# Patient Record
Sex: Female | Born: 1985 | Race: Black or African American | Hispanic: No | Marital: Single | Smoking: Current some day smoker
Health system: Southern US, Community
[De-identification: ages and names within clinical notes are randomized; demographics above are authoritative.]

## PROBLEM LIST (undated history)

## (undated) ENCOUNTER — Inpatient Hospital Stay (HOSPITAL_COMMUNITY): Payer: Self-pay

## (undated) ENCOUNTER — Emergency Department (HOSPITAL_COMMUNITY): Payer: Medicaid Other | Source: Home / Self Care

## (undated) DIAGNOSIS — D649 Anemia, unspecified: Secondary | ICD-10-CM

## (undated) DIAGNOSIS — E162 Hypoglycemia, unspecified: Secondary | ICD-10-CM

## (undated) DIAGNOSIS — F32A Depression, unspecified: Secondary | ICD-10-CM

## (undated) DIAGNOSIS — K219 Gastro-esophageal reflux disease without esophagitis: Secondary | ICD-10-CM

## (undated) DIAGNOSIS — F329 Major depressive disorder, single episode, unspecified: Secondary | ICD-10-CM

## (undated) DIAGNOSIS — B999 Unspecified infectious disease: Secondary | ICD-10-CM

## (undated) DIAGNOSIS — F419 Anxiety disorder, unspecified: Secondary | ICD-10-CM

## (undated) HISTORY — PX: EYE SURGERY: SHX253

---

## 2011-07-07 ENCOUNTER — Emergency Department (HOSPITAL_COMMUNITY)
Admission: EM | Admit: 2011-07-07 | Discharge: 2011-07-07 | Disposition: A | Discharge status: Home / Self Care | Payer: Self-pay | Attending: Emergency Medicine | Admitting: Emergency Medicine

## 2011-07-07 DIAGNOSIS — N644 Mastodynia: Secondary | ICD-10-CM | POA: Insufficient documentation

## 2011-07-07 DIAGNOSIS — M546 Pain in thoracic spine: Secondary | ICD-10-CM | POA: Insufficient documentation

## 2011-07-07 DIAGNOSIS — F411 Generalized anxiety disorder: Secondary | ICD-10-CM | POA: Insufficient documentation

## 2011-07-07 DIAGNOSIS — M25519 Pain in unspecified shoulder: Secondary | ICD-10-CM | POA: Insufficient documentation

## 2011-07-07 DIAGNOSIS — M542 Cervicalgia: Secondary | ICD-10-CM | POA: Insufficient documentation

## 2011-11-23 ENCOUNTER — Encounter (HOSPITAL_COMMUNITY): Payer: Self-pay

## 2011-11-23 ENCOUNTER — Emergency Department (HOSPITAL_COMMUNITY)
Admission: EM | Admit: 2011-11-23 | Discharge: 2011-11-23 | Disposition: A | Discharge status: Home / Self Care | Payer: Medicaid Other | Attending: Emergency Medicine | Admitting: Emergency Medicine

## 2011-11-23 ENCOUNTER — Emergency Department (HOSPITAL_COMMUNITY): Payer: Medicaid Other

## 2011-11-23 DIAGNOSIS — J40 Bronchitis, not specified as acute or chronic: Secondary | ICD-10-CM | POA: Insufficient documentation

## 2011-11-23 DIAGNOSIS — R109 Unspecified abdominal pain: Secondary | ICD-10-CM | POA: Insufficient documentation

## 2011-11-23 DIAGNOSIS — M549 Dorsalgia, unspecified: Secondary | ICD-10-CM | POA: Insufficient documentation

## 2011-11-23 DIAGNOSIS — K219 Gastro-esophageal reflux disease without esophagitis: Secondary | ICD-10-CM | POA: Insufficient documentation

## 2011-11-23 DIAGNOSIS — R059 Cough, unspecified: Secondary | ICD-10-CM | POA: Insufficient documentation

## 2011-11-23 DIAGNOSIS — R05 Cough: Secondary | ICD-10-CM | POA: Insufficient documentation

## 2011-11-23 DIAGNOSIS — J3489 Other specified disorders of nose and nasal sinuses: Secondary | ICD-10-CM | POA: Insufficient documentation

## 2011-11-23 LAB — COMPREHENSIVE METABOLIC PANEL
ALT: 57 U/L — ABNORMAL HIGH (ref 0–35)
AST: 34 U/L (ref 0–37)
Albumin: 4 g/dL (ref 3.5–5.2)
Alkaline Phosphatase: 94 U/L (ref 39–117)
CO2: 27 mEq/L (ref 19–32)
Chloride: 101 mEq/L (ref 96–112)
Creatinine, Ser: 0.61 mg/dL (ref 0.50–1.10)
Potassium: 3.6 mEq/L (ref 3.5–5.1)
Sodium: 136 mEq/L (ref 135–145)
Total Bilirubin: 0.3 mg/dL (ref 0.3–1.2)

## 2011-11-23 LAB — LIPASE, BLOOD: Lipase: 24 U/L (ref 11–59)

## 2011-11-23 MED ORDER — OMEPRAZOLE 20 MG PO CPDR: 20.0000 mg | DELAYED_RELEASE_CAPSULE | Freq: Every day | ORAL | Status: DC

## 2011-11-23 MED ORDER — GUAIFENESIN ER 600 MG PO TB12: 600.0000 mg | ORAL_TABLET | Freq: Two times a day (BID) | ORAL | Status: DC

## 2011-11-23 MED ORDER — AZITHROMYCIN 250 MG PO TABS: 250.0000 mg | ORAL_TABLET | Freq: Every day | ORAL | Status: AC

## 2011-11-23 NOTE — Discharge Instructions (Signed)
Your labs today were normal. It is recommended that you follow up with a local primary care Dr. for further evaluation and treatment for your symptoms. You have been given a trial prescription of omeprazole for reflux. This sometimes takes several weeks to become effective; please continue to take it.   Your chest x-ray shows minimal bronchitis. Take the Mucinex as prescribed for congestion. Continue to use your Nasonex as prescribed. You may use the Afrin that you have at home as well, but do not use it for more than 5 days. You may additionally try saltwater nose spray. If you're having worsening chest pain, shortness of breath, high fever, or any other worrisome symptoms, please return to the ER for further evaluation and treatment.  RESOURCE GUIDE  Dental Problems  Patients with Medicaid: [facility] [phone] [address]                                           [phone] [address] Phone:  [phone]                                                  Phone:  [phone]  If unable to pay or uninsured, contact:  [facility] or [facility]. to become qualified for the adult dental clinic.  Chronic Pain Problems Contact [facility]  [phone] Patients need to be referred by their primary care doctor.  Insufficient Money for Medicine Contact [employer]:  call "211" or [facility] (619) 221-0324.  No Primary Care Doctor Call [facility]  [phone] Other agencies that provide inexpensive medical care    [facility]  [phone]    [facility]  [phone]    [facility]  [phone]    [facility]  [phone]    [facility]  365-800-7166    [facility]  [phone]  Psychological Services [facility]  [phone] [facility]  [phone] [facility]   [phone] (emergency services [phone])  Substance Abuse  Resources [facility]  (804)056-1792 [facility] 561-659-6296 [facility] 210-373-6196 Floydene Flock (743)570-9113 Residential & Outpatient Substance Abuse Program  717-194-1617  Abuse/Neglect [facility] Child Abuse Hotline 4167653729 [facility] Child Abuse Hotline (574)698-5799 (After Hours)  Emergency Shelter [facility] (412) 224-7672  [facility] [place] [facility] ([phone] [phone] [facility] 2208034987  MRSA Hotline #:   (502) 516-2601    [facility] Resources  [facility]     [employer]                          [facility] Dept. [address] [place]                       [address]      [address]  [address]  Cristobal Goldmann Phone:  [phone]                                   Phone:  [phone]                 Phone:  [phone]  [facility] Phone:  [phone]  [facility] Child Abuse Hotline (818)504-0510 562-004-8540 (After Hours)  Bronchitis Bronchitis is the body's way of reacting to injury and/or infection (inflammation) of the bronchi. Bronchi are the air tubes that extend from the windpipe into the lungs. If the inflammation becomes severe, it may cause shortness of breath. CAUSES  Inflammation may be caused by:  A virus.   Germs (bacteria).   Dust.   Allergens.   Pollutants and many other irritants.  The cells lining the bronchial tree are covered with tiny hairs (cilia). These constantly beat upward, away from the lungs, toward the mouth. This keeps the lungs free of pollutants. When these cells become too irritated and are unable to do their job, mucus begins to develop. This causes the characteristic cough of bronchitis. The cough clears the lungs when the cilia are unable to do their job. Without either  of these protective mechanisms, the mucus would settle in the lungs. Then you would develop pneumonia. Smoking is a common cause of bronchitis and can contribute to pneumonia. Stopping this habit is the single most important thing you can do to help yourself. TREATMENT   Your caregiver may prescribe an antibiotic if the cough is caused by bacteria. Also, medicines that open up your airways make it easier to breathe. Your caregiver may also recommend or prescribe an expectorant. It will loosen the mucus to be coughed up. Only take over-the-counter or prescription medicines for pain, discomfort, or fever as directed by your caregiver.   Removing whatever causes the problem (smoking, for example) is critical to preventing the problem from getting worse.   Cough suppressants may be prescribed for relief of cough symptoms.   Inhaled medicines may be prescribed to help with symptoms now and to help prevent problems from returning.   For those with recurrent (chronic) bronchitis, there may be a need for steroid medicines.  SEEK IMMEDIATE MEDICAL CARE IF:   During treatment, you develop more pus-like mucus (purulent sputum).   You have a fever.   Your baby is older than 3 months with a rectal temperature of 102 F (38.9 C) or higher.   Your baby is 56 months old or younger with a rectal temperature of 100.4 F (38 C) or higher.   You become progressively more ill.   You have increased difficulty breathing, wheezing, or shortness of breath.  It is necessary to seek immediate medical care if you are elderly or sick from any other disease. MAKE SURE YOU:   Understand these instructions.   Will watch your condition.   Will get help right away if you are not doing well or get worse.  Document Released: 09/22/2005 Document Revised: 06/04/2011 Document Reviewed: 08/01/2008 [facility] Patient Information 2012 [place], [place].  Gastroesophageal Reflux Disease, Adult Gastroesophageal reflux  disease (GERD) happens when acid from your stomach flows up into the esophagus. When acid comes in contact with the esophagus, the acid causes soreness (  inflammation) in the esophagus. Over time, GERD may create small holes (ulcers) in the lining of the esophagus. CAUSES   Increased body weight. This puts pressure on the stomach, making acid rise from the stomach into the esophagus.   Smoking. This increases acid production in the stomach.   Drinking alcohol. This causes decreased pressure in the lower esophageal sphincter (valve or ring of muscle between the esophagus and stomach), allowing acid from the stomach into the esophagus.   Late evening meals and a full stomach. This increases pressure and acid production in the stomach.   A malformed lower esophageal sphincter.  Sometimes, no cause is found. SYMPTOMS   Burning pain in the lower part of the mid-chest behind the breastbone and in the mid-stomach area. This may occur twice a week or more often.   Trouble swallowing.   Sore throat.   Dry cough.   Asthma-like symptoms including chest tightness, shortness of breath, or wheezing.  DIAGNOSIS  Your caregiver may be able to diagnose GERD based on your symptoms. In some cases, X-rays and other tests may be done to check for complications or to check the condition of your stomach and esophagus. TREATMENT  Your caregiver may recommend over-the-counter or prescription medicines to help decrease acid production. Ask your caregiver before starting or adding any new medicines.  HOME CARE INSTRUCTIONS   Change the factors that you can control. Ask your caregiver for guidance concerning weight loss, quitting smoking, and alcohol consumption.   Avoid foods and drinks that make your symptoms worse, such as:   Caffeine or alcoholic drinks.   Chocolate.   Peppermint or mint flavorings.   Garlic and onions.   Spicy foods.   Citrus fruits, such as oranges, lemons, or limes.    Tomato-based foods such as sauce, chili, salsa, and pizza.   Fried and fatty foods.   Avoid lying down for the 3 hours prior to your bedtime or prior to taking a nap.   Eat small, frequent meals instead of large meals.   Wear loose-fitting clothing. Do not wear anything tight around your waist that causes pressure on your stomach.   Raise the head of your bed 6 to 8 inches with wood blocks to help you sleep. Extra pillows will not help.   Only take over-the-counter or prescription medicines for pain, discomfort, or fever as directed by your caregiver.   Do not take aspirin, ibuprofen, or other nonsteroidal anti-inflammatory drugs (NSAIDs).  SEEK IMMEDIATE MEDICAL CARE IF:   You have pain in your arms, neck, jaw, teeth, or back.   Your pain increases or changes in intensity or duration.   You develop nausea, vomiting, or sweating (diaphoresis).   You develop shortness of breath, or you faint.   Your vomit is green, yellow, black, or looks like coffee grounds or blood.   Your stool is red, bloody, or black.  These symptoms could be signs of other problems, such as heart disease, gastric bleeding, or esophageal bleeding. MAKE SURE YOU:   Understand these instructions.   Will watch your condition.   Will get help right away if you are not doing well or get worse.  Document Released: 07/02/2005 Document Revised: 06/04/2011 Document Reviewed: 04/11/2011 [facility] Patient Information 2012 [place], [place].

## 2011-11-23 NOTE — ED Notes (Signed)
Patient here with congestion and productive cough with green sputum x 1 week. Reports no relief with otc meds

## 2011-11-23 NOTE — ED Provider Notes (Signed)
History     CSN: [id number]  Arrival date & time 11/23/11  1741   First MD Initiated Contact with Patient 11/23/11 1941      No chief complaint on file.   (Consider location/radiation/quality/duration/timing/severity/associated sxs/prior treatment) Patient is a 26 y.o. female presenting with URI. The history is provided by the patient.  URI The primary symptoms include cough. Primary symptoms do not include fever, fatigue, headaches, ear pain, sore throat, swollen glands, wheezing, abdominal pain, nausea, vomiting, myalgias, arthralgias or rash. The current episode started 6 to 7 days ago. This is a new problem. The problem has not changed since onset. The cough is productive. The sputum is green.  Symptoms associated with the illness include sinus pressure and congestion. The illness is not associated with chills.   Pt also states she's had several months of upper abd pain which is sometimes worse with eating. It is described as achy in nature and sometimes radiates to upper back. Recently moved to the area - states she was evaled in the past with HIDA which was nl but "showed some slight reflux." Has not tried any antacids for this. Denies associated nausea/vomiting.  History reviewed. No pertinent past medical history.  History reviewed. No pertinent past surgical history.  No family history on file.  History  Substance Use Topics  . Smoking status: Never Smoker   . Smokeless tobacco: Not on file  . Alcohol Use:     OB History    Grav Para Term Preterm Abortions TAB SAB Ect Mult Living                  Review of Systems  Constitutional: Negative for fever, chills and fatigue.  HENT: Positive for congestion and sinus pressure. Negative for ear pain and sore throat.   Respiratory: Positive for cough. Negative for wheezing.   Gastrointestinal: Negative for nausea, vomiting and abdominal pain.  Musculoskeletal: Negative for myalgias and arthralgias.  Skin: Negative for  rash.  Neurological: Negative for headaches.    Allergies  Review of patient's allergies indicates no known allergies.  Home Medications   Current Outpatient Rx  Name Route Sig Dispense Refill  . IBUPROFEN 200 MG PO TABS Oral Take 200 mg by mouth every 6 (six) hours as needed. For pain relief    . MOMETASONE FUROATE 50 MCG/ACT NA SUSP Nasal Place 2 sprays into the nose daily.      BP 131/60  Pulse 91  Temp 98.6 F (37 C)  Resp 18  SpO2 100%  LMP 11/09/2011  Physical Exam  Nursing note and vitals reviewed. Constitutional: She is oriented to person, place, and time. She appears well-developed and well-nourished. No distress.  HENT:  Head: Normocephalic and atraumatic.  Right Ear: External ear normal.  Left Ear: External ear normal.  Mouth/Throat: Oropharynx is clear and moist. No oropharyngeal exudate.       TMs nl b/l Tender to palp of L frontal, maxillary sinuses  Eyes: Conjunctivae and EOM are normal. Pupils are equal, round, and reactive to light. Right eye exhibits no discharge. Left eye exhibits no discharge.  Neck: Normal range of motion. Neck supple.  Cardiovascular: Normal rate, regular rhythm and normal heart sounds.  Exam reveals no gallop and no friction rub.   No murmur heard. Pulmonary/Chest: Effort normal and breath sounds normal. She has no wheezes. She exhibits no tenderness.  Abdominal: Soft. Bowel sounds are normal. There is no tenderness. There is no rebound and no guarding.  Musculoskeletal: Normal  range of motion.  Lymphadenopathy:    She has no cervical adenopathy.  Neurological: She is alert and oriented to person, place, and time.  Skin: Skin is warm and dry. She is not diaphoretic.  Psychiatric: She has a normal mood and affect.    ED Course  Procedures (including critical care time)  Labs Reviewed  COMPREHENSIVE METABOLIC PANEL - Abnormal; Notable for the following:    Calcium 10.7 (*)    ALT 57 (*)    All other components within normal  limits  LIPASE, BLOOD   Dg Chest 2 View  11/23/2011  *RADIOLOGY REPORT*  Clinical Data: Productive cough.  Left lateral chest pain.  Ex- smoker.  CHEST - 2 VIEW  Comparison: None.  Findings: Normal sized heart.  Clear lungs.  Minimal diffuse peribronchial thickening.  Minimal scoliosis.  IMPRESSION: Minimal bronchitic changes.  Original Report Authenticated By: Darrol Angel, M.D.     1. Bronchitis   2. GERD (gastroesophageal reflux disease)       MDM  Pt with bronchitis changes on CXR. Will tx. Also will give trial of prilosec for probable reflux. Emphasized the importance of establishing care with local PCP. Return precautions discussed.        Grant Fontana, [place] 11/24/11 1309

## 2011-11-24 NOTE — ED Provider Notes (Signed)
Medical screening examination/treatment/procedure(s) were performed by non-physician practitioner and as supervising physician I was immediately available for consultation/collaboration.   Garion Wempe, MD 11/24/11 2252 

## 2012-03-11 ENCOUNTER — Ambulatory Visit (INDEPENDENT_AMBULATORY_CARE_PROVIDER_SITE_OTHER): Payer: Medicaid Other | Admitting: Advanced Practice Midwife

## 2012-03-11 ENCOUNTER — Other Ambulatory Visit (HOSPITAL_COMMUNITY)
Admission: RE | Admit: 2012-03-11 | Discharge: 2012-03-11 | Disposition: A | Discharge status: Home / Self Care | Payer: Medicaid Other | Source: Ambulatory Visit | Attending: Advanced Practice Midwife | Admitting: Advanced Practice Midwife

## 2012-03-11 ENCOUNTER — Encounter: Payer: Self-pay | Admitting: Advanced Practice Midwife

## 2012-03-11 ENCOUNTER — Other Ambulatory Visit: Payer: Self-pay | Admitting: Advanced Practice Midwife

## 2012-03-11 VITALS — BP 121/70 | HR 69 | Temp 97.1°F | Ht 61.0 in | Wt 143.6 lb

## 2012-03-11 DIAGNOSIS — Z124 Encounter for screening for malignant neoplasm of cervix: Secondary | ICD-10-CM

## 2012-03-11 DIAGNOSIS — N6452 Nipple discharge: Secondary | ICD-10-CM

## 2012-03-11 DIAGNOSIS — N949 Unspecified condition associated with female genital organs and menstrual cycle: Secondary | ICD-10-CM

## 2012-03-11 DIAGNOSIS — Z01419 Encounter for gynecological examination (general) (routine) without abnormal findings: Secondary | ICD-10-CM | POA: Insufficient documentation

## 2012-03-11 DIAGNOSIS — N898 Other specified noninflammatory disorders of vagina: Secondary | ICD-10-CM

## 2012-03-11 DIAGNOSIS — R5383 Other fatigue: Secondary | ICD-10-CM

## 2012-03-11 DIAGNOSIS — N6459 Other signs and symptoms in breast: Secondary | ICD-10-CM

## 2012-03-11 DIAGNOSIS — R5381 Other malaise: Secondary | ICD-10-CM

## 2012-03-11 NOTE — Patient Instructions (Signed)
Place annual gynecologic exam patient instructions here.

## 2012-03-11 NOTE — Progress Notes (Signed)
Subjective:     Tonya Yates is a 26 y.o. female here for a routine exam. Current complaints: left nipple discharge, fatigue, vaginal odor.     Gynecologic History Patient's last menstrual period was 03/02/2012. Contraception: abstinence Last Pap: unsure. Results were: normal per pt.  Last mammogram: NA`. Results were: NA  Obstetric History OB History    Grav Para Term Preterm Abortions TAB SAB Ect Mult Living   4 4 4       4      # Outc Date GA Lbr Len/2nd Wgt Sex Del Anes PTL Lv   1 TRM            2 TRM            3 TRM            4 TRM               The following portions of the patient's history were reviewed and updated as appropriate: allergies, current medications, past family history, past medical history, past social history, past surgical history and problem list.  Review of Systems A comprehensive review of systems was negative except for:  Integument/breast: positive for scant amount of white, odorless left nipple discharge noticed a few months ago. Stopped breastfeeding 3 years ago. Negative for -  beast pain, masses skin changes or nipple retraction. Denies nipple stimulation, Endocrine: Positive for - fatigue/lethargy, galactorrhea. Negative for -menstrual changes, hair pattern changes, hot flashes, palpitations, polydipsia/polyuria, temperature intolerance or unexpected weight changes Genito-Urinary: Positive for - vaginal odor. Negative for - dyspareunia, vaginal discharge, pelvic pain, fever or chill.      Objective:    General appearance: alert, cooperative and no distress Head: Normocephalic, without obvious abnormality, atraumatic Neck: no adenopathy, supple, symmetrical, trachea midline and thyroid not enlarged, symmetric, no tenderness/mass/nodules Lungs: clear to auscultation bilaterally Breasts: normal appearance, no masses or tenderness, No nipple retraction or dimpling, Taught monthly breast self examination, positive findings: nipple discharge  expressed on the left. Heart: regular rate and rhythm, S1, S2 normal, no murmur, click, rub or gallop Abdomen: soft, non-tender; bowel sounds normal; no masses,  no organomegaly Pelvic: cervix normal in appearance, external genitalia normal, no adnexal masses or tenderness, no cervical motion tenderness, positive findings: vaginal odor, uterus normal size, shape, and consistency and vagina normal without discharge Extremities: extremities normal, atraumatic, no cyanosis or edema and Homans sign is negative, no sign of DVT Neurologic: Grossly normal. A&OX4    Assessment:    Healthy female exam.  1. Screening for malignant neoplasm of the cervix  Cytology - PAP  2. Vaginal odor  Wet prep, genital  3. Fatigue  TSH  4. Nipple discharge in female  Body fluid culture   Plan:    Education reviewed: safe sex/STD prevention and self breast exams. Contraception: abstinence. Follow up in: 4 weeks. Instructed to stop all nipple stimulation. If nipple discharge continues, draw prolactin.

## 2012-03-12 LAB — TSH: TSH: 1.003 u[IU]/mL (ref 0.350–4.500)

## 2012-03-12 LAB — WET PREP, GENITAL: Trich, Wet Prep: NONE SEEN

## 2012-03-14 ENCOUNTER — Other Ambulatory Visit: Payer: Self-pay | Admitting: Advanced Practice Midwife

## 2012-03-14 DIAGNOSIS — B9689 Other specified bacterial agents as the cause of diseases classified elsewhere: Secondary | ICD-10-CM

## 2012-03-14 MED ORDER — METRONIDAZOLE 500 MG PO TABS: 500.0000 mg | ORAL_TABLET | Freq: Two times a day (BID) | ORAL | Status: AC

## 2012-03-14 NOTE — Progress Notes (Signed)
Wet prep + clue. Pt symptomatic. Will Rx Flagyl.  [place], CNM 03/14/2012 5:15 AM

## 2012-03-15 NOTE — Progress Notes (Signed)
Pt informed. Will pick up her medicine.

## 2012-05-08 ENCOUNTER — Emergency Department (HOSPITAL_COMMUNITY)
Admission: EM | Admit: 2012-05-08 | Discharge: 2012-05-08 | Disposition: A | Discharge status: Home / Self Care | Payer: Medicaid Other | Attending: Emergency Medicine | Admitting: Emergency Medicine

## 2012-05-08 ENCOUNTER — Encounter (HOSPITAL_COMMUNITY): Payer: Self-pay | Admitting: *Deleted

## 2012-05-08 DIAGNOSIS — R0981 Nasal congestion: Secondary | ICD-10-CM

## 2012-05-08 DIAGNOSIS — J069 Acute upper respiratory infection, unspecified: Secondary | ICD-10-CM

## 2012-05-08 MED ORDER — GUAIFENESIN ER 600 MG PO TB12: 1200.0000 mg | ORAL_TABLET | Freq: Two times a day (BID) | ORAL | Status: DC

## 2012-05-08 MED ORDER — CETIRIZINE-PSEUDOEPHEDRINE ER 5-120 MG PO TB12: 1.0000 | ORAL_TABLET | Freq: Every day | ORAL | Status: DC

## 2012-05-08 NOTE — ED Notes (Signed)
Pt states that two days ago she started with a cold then over the past 2 days she has felt worse in the morning and right before bed. Pt states she is continues to have HA with sinus pressure. Pt states PCP visit schedule for sept but could not wait.

## 2012-05-08 NOTE — ED Provider Notes (Signed)
History     CSN: [id number]  Arrival date & time 05/08/12  4098   First MD Initiated Contact with Patient 05/08/12 2155      Chief Complaint  Patient presents with  . Sinusitis   HPI  History provided by the patient. Patient is a 26 year old female with no significant PMH who presents with complaints of nasal congestion, right sinus pressure and mild headache for the past 2 days. Symptoms are also associated with some fatigue and occasional cough. Cough is nonproductive. Patient denies any chest pain or shortness of breath. She denies any fever, chills or sweats. Patient does report being around friend who was diagnosed with a sinus infection as well as another friend diagnosed with bronchitis recently. She denies any recent travel. Patient has not taken any medications for symptoms.    History reviewed. No pertinent past medical history.  History reviewed. No pertinent past surgical history.  History reviewed. No pertinent family history.  History  Substance Use Topics  . Smoking status: Never Smoker   . Smokeless tobacco: Never Used  . Alcohol Use: No    OB History    Grav Para Term Preterm Abortions TAB SAB Ect Mult Living   4 4 4       4       Review of Systems  Constitutional: Positive for fatigue. Negative for fever, chills and appetite change.  HENT: Positive for congestion, sore throat and sinus pressure.   Respiratory: Positive for cough. Negative for shortness of breath.   Cardiovascular: Negative for chest pain.  Gastrointestinal: Negative for vomiting and diarrhea.  Musculoskeletal: Negative for myalgias.  Neurological: Positive for headaches. Negative for dizziness and light-headedness.    Allergies  Review of patient's allergies indicates no known allergies.  Home Medications   Current Outpatient Rx  Name Route Sig Dispense Refill  . IBUPROFEN 200 MG PO TABS Oral Take 200 mg by mouth every 6 (six) hours as needed. For pain relief      BP 109/59   Pulse 65  Temp 98.3 F (36.8 C) (Oral)  Resp 16  SpO2 98%  Physical Exam  Nursing note and vitals reviewed. Constitutional: She is oriented to person, place, and time. She appears well-developed and well-nourished. No distress.  HENT:  Head: Normocephalic and atraumatic.  Right Ear: Tympanic membrane normal.  Left Ear: Tympanic membrane normal.  Nose: Mucosal edema and rhinorrhea present. Right sinus exhibits maxillary sinus tenderness and frontal sinus tenderness.  Mouth/Throat: Uvula is midline and mucous membranes are normal.  Eyes: Conjunctivae and EOM are normal. Pupils are equal, round, and reactive to light.  Neck: Normal range of motion. Neck supple.  Cardiovascular: Normal rate and regular rhythm.   Pulmonary/Chest: Effort normal and breath sounds normal. No respiratory distress. She has no wheezes. She has no rales.  Abdominal: Soft. There is no tenderness. There is no rebound and no guarding.  Lymphadenopathy:    She has no cervical adenopathy.  Neurological: She is alert and oriented to person, place, and time.  Skin: Skin is warm and dry. No rash noted.  Psychiatric: She has a normal mood and affect. Her behavior is normal.    ED Course  Procedures    1. URI (upper respiratory infection)   2. Nasal congestion       MDM  10:05 PM patient seen and evaluated. Patient is well-appearing and nontoxic. Patient is afebrile. No concerning or emergent conditions on exam. Suspect viral URI with nasal congestion symptoms. Will treat symptomatically.  Angus Seller, [place] 05/08/12 2248

## 2012-05-09 NOTE — ED Provider Notes (Signed)
Medical screening examination/treatment/procedure(s) were performed by non-physician practitioner and as supervising physician I was immediately available for consultation/collaboration.   Edilia Ghuman, MD 05/09/12 1508 

## 2013-03-09 ENCOUNTER — Emergency Department (HOSPITAL_COMMUNITY)
Admission: EM | Admit: 2013-03-09 | Discharge: 2013-03-09 | Disposition: A | Discharge status: Home / Self Care | Payer: Medicaid Other | Attending: Emergency Medicine | Admitting: Emergency Medicine

## 2013-03-09 ENCOUNTER — Encounter (HOSPITAL_COMMUNITY): Payer: Self-pay | Admitting: Emergency Medicine

## 2013-03-09 DIAGNOSIS — Z8719 Personal history of other diseases of the digestive system: Secondary | ICD-10-CM | POA: Insufficient documentation

## 2013-03-09 DIAGNOSIS — Z862 Personal history of diseases of the blood and blood-forming organs and certain disorders involving the immune mechanism: Secondary | ICD-10-CM | POA: Insufficient documentation

## 2013-03-09 DIAGNOSIS — R197 Diarrhea, unspecified: Secondary | ICD-10-CM | POA: Insufficient documentation

## 2013-03-09 DIAGNOSIS — A088 Other specified intestinal infections: Secondary | ICD-10-CM | POA: Insufficient documentation

## 2013-03-09 DIAGNOSIS — A084 Viral intestinal infection, unspecified: Secondary | ICD-10-CM

## 2013-03-09 DIAGNOSIS — R5381 Other malaise: Secondary | ICD-10-CM | POA: Insufficient documentation

## 2013-03-09 DIAGNOSIS — M549 Dorsalgia, unspecified: Secondary | ICD-10-CM | POA: Insufficient documentation

## 2013-03-09 DIAGNOSIS — Z3202 Encounter for pregnancy test, result negative: Secondary | ICD-10-CM | POA: Insufficient documentation

## 2013-03-09 DIAGNOSIS — R5383 Other fatigue: Secondary | ICD-10-CM | POA: Insufficient documentation

## 2013-03-09 DIAGNOSIS — Z8639 Personal history of other endocrine, nutritional and metabolic disease: Secondary | ICD-10-CM | POA: Insufficient documentation

## 2013-03-09 DIAGNOSIS — R109 Unspecified abdominal pain: Secondary | ICD-10-CM | POA: Insufficient documentation

## 2013-03-09 HISTORY — DX: Hypoglycemia, unspecified: E16.2

## 2013-03-09 HISTORY — DX: Gastro-esophageal reflux disease without esophagitis: K21.9

## 2013-03-09 LAB — URINALYSIS, ROUTINE W REFLEX MICROSCOPIC
Ketones, ur: 40 mg/dL — AB
Leukocytes, UA: NEGATIVE
Nitrite: NEGATIVE
pH: 8 (ref 5.0–8.0)

## 2013-03-09 LAB — PREGNANCY, URINE: Preg Test, Ur: NEGATIVE

## 2013-03-09 MED ORDER — SODIUM CHLORIDE 0.9 % IV BOLUS (SEPSIS)
1000.0000 mL | Freq: Once | INTRAVENOUS | Status: AC
  Administered 2013-03-09: 1000 mL via INTRAVENOUS

## 2013-03-09 MED ORDER — KETOROLAC TROMETHAMINE 30 MG/ML IJ SOLN
30.0000 mg | Freq: Once | INTRAMUSCULAR | Status: AC
  Administered 2013-03-09: 30 mg via INTRAVENOUS
  Filled 2013-03-09: qty 1

## 2013-03-09 MED ORDER — ONDANSETRON HCL 4 MG PO TABS: 4.0000 mg | ORAL_TABLET | Freq: Four times a day (QID) | ORAL | Status: DC

## 2013-03-09 MED ORDER — ONDANSETRON HCL 4 MG/2ML IJ SOLN
4.0000 mg | Freq: Once | INTRAMUSCULAR | Status: AC
  Administered 2013-03-09: 4 mg via INTRAVENOUS
  Filled 2013-03-09: qty 2

## 2013-03-09 NOTE — ED Provider Notes (Signed)
History     CSN: [id number]  Arrival date & time 03/09/13  4098   First MD Initiated Contact with Patient 03/09/13 [phone]      Chief Complaint  Patient presents with  . Emesis    (Consider location/radiation/quality/duration/timing/severity/associated sxs/prior treatment) HPI Comments: 27 y/o female with a PMHx of acid reflux presents to the ED complaining of mid-epigastric abdominal pain radiating throughout her abdomen beginning last night around 9:00 pm while at work after eating chicken and carrots. Describes the pain as her "stomach turning" rated 9/10, worse after eating. She has not tried any alleviating factors. Admits to associated nausea, vomiting and diarrhea, vomited multiple times since last night and her back is beginning to hurt. Denies hematemesis or bloody stools. States she cannot keep anything down. No fever, urinary symptoms, or vaginal complaints. LMP 5/14 and was normal. States her daughter was recently sick with similar symptoms and was diagnosed with a virus and is now feeling better.  Patient is a 27 y.o. female presenting with vomiting. The history is provided by the patient.  Emesis Associated symptoms: no chills     Past Medical History  Diagnosis Date  . Acid reflux   . Hypoglycemia     No past surgical history on file.  No family history on file.  History  Substance Use Topics  . Smoking status: Never Smoker   . Smokeless tobacco: Never Used  . Alcohol Use: No    OB History   Grav Para Term Preterm Abortions TAB SAB Ect Mult Living   4 4 4       4       Review of Systems  Constitutional: Positive for appetite change and fatigue. Negative for fever and chills.  Respiratory: Negative for shortness of breath.   Cardiovascular: Negative for chest pain.  Gastrointestinal: Positive for vomiting.  Genitourinary: Negative for dysuria, urgency, frequency, hematuria, vaginal bleeding, vaginal discharge, difficulty urinating, vaginal pain and pelvic  pain.  Musculoskeletal: Positive for back pain.  Neurological: Negative for dizziness and light-headedness.  All other systems reviewed and are negative.    Allergies  Review of patient's allergies indicates no known allergies.  Home Medications   Current Outpatient Rx  Name  Route  Sig  Dispense  Refill  . ibuprofen (ADVIL,MOTRIN) 200 MG tablet   Oral   Take 200 mg by mouth every 6 (six) hours as needed. For pain relief           BP 108/65  Pulse 89  Temp(Src) 98.7 F (37.1 C) (Oral)  Resp 18  SpO2 100%  Physical Exam  Nursing note and vitals reviewed. Constitutional: She is oriented to person, place, and time. She appears well-developed and well-nourished. No distress.  HENT:  Head: Normocephalic and atraumatic.  Mouth/Throat: Oropharynx is clear and moist.  Eyes: Conjunctivae are normal. No scleral icterus.  Neck: Normal range of motion. Neck supple.  Cardiovascular: Normal rate, regular rhythm, normal heart sounds and intact distal pulses.   Pulmonary/Chest: Effort normal and breath sounds normal. No respiratory distress. She has no wheezes.  Abdominal: Soft. Normal appearance and bowel sounds are normal. She exhibits no distension and no mass. There is tenderness in the epigastric area and suprapubic area. There is no rigidity, no rebound, no guarding and no CVA tenderness.  Musculoskeletal: Normal range of motion. She exhibits no edema.  Neurological: She is alert and oriented to person, place, and time.  Skin: Skin is warm and dry. She is not diaphoretic. No pallor.  Psychiatric: She has a normal mood and affect. Her behavior is normal.    ED Course  Procedures (including critical care time)  Labs Reviewed  URINALYSIS, ROUTINE W REFLEX MICROSCOPIC  PREGNANCY, URINE   No results found.   1. Viral gastroenteritis       MDM  27 y/o female with abdominal pain, n/v/d. Probable viral gastroenteritis. Tenderness mid-epigastric and suprapubic region. She  appears in NAD. Will give fluids, zofran, toradol and re-assess. Obtaining U/A. I do not feel further labs are necessary at this point. 9:42 AM Patient felt better after receiving fluids, zofran, toradol. She was able to tolerate PO without any returning symptoms. Vitals stable and stable for discharge. Dx viral gastroenteritis. Conservative measures discussed, zofran for nausea. Return precautions discussed. Patient states understanding of plan and is agreeable.    Trevor Mace, PA-C 03/09/13 [phone]

## 2013-03-09 NOTE — ED Notes (Signed)
Vomiting since last night all over abd pain child just had same virus no diarrhea lmp last month 5/14

## 2013-03-09 NOTE — ED Notes (Signed)
Pt tolerated apple juice. Reports nausea but no vomiting.

## 2013-03-10 ENCOUNTER — Emergency Department (HOSPITAL_COMMUNITY)
Admission: EM | Admit: 2013-03-10 | Discharge: 2013-03-11 | Disposition: A | Discharge status: Home / Self Care | Payer: Medicaid Other | Attending: Emergency Medicine | Admitting: Emergency Medicine

## 2013-03-10 DIAGNOSIS — Z8639 Personal history of other endocrine, nutritional and metabolic disease: Secondary | ICD-10-CM | POA: Insufficient documentation

## 2013-03-10 DIAGNOSIS — B9789 Other viral agents as the cause of diseases classified elsewhere: Secondary | ICD-10-CM | POA: Insufficient documentation

## 2013-03-10 DIAGNOSIS — R51 Headache: Secondary | ICD-10-CM | POA: Insufficient documentation

## 2013-03-10 DIAGNOSIS — Z3202 Encounter for pregnancy test, result negative: Secondary | ICD-10-CM | POA: Insufficient documentation

## 2013-03-10 DIAGNOSIS — B349 Viral infection, unspecified: Secondary | ICD-10-CM

## 2013-03-10 DIAGNOSIS — Z862 Personal history of diseases of the blood and blood-forming organs and certain disorders involving the immune mechanism: Secondary | ICD-10-CM | POA: Insufficient documentation

## 2013-03-10 DIAGNOSIS — Z8719 Personal history of other diseases of the digestive system: Secondary | ICD-10-CM | POA: Insufficient documentation

## 2013-03-10 DIAGNOSIS — M542 Cervicalgia: Secondary | ICD-10-CM | POA: Insufficient documentation

## 2013-03-10 LAB — CBC
MCH: 28.4 pg (ref 26.0–34.0)
MCHC: 32.9 g/dL (ref 30.0–36.0)
Platelets: 221 10*3/uL (ref 150–400)
RBC: 4.12 MIL/uL (ref 3.87–5.11)

## 2013-03-10 LAB — BASIC METABOLIC PANEL
CO2: 29 mEq/L (ref 19–32)
Calcium: 9.5 mg/dL (ref 8.4–10.5)
GFR calc non Af Amer: >90 mL/min (ref >90)
Sodium: 141 mEq/L (ref 135–145)

## 2013-03-10 NOTE — ED Provider Notes (Signed)
Medical screening examination/treatment/procedure(s) were performed by non-physician practitioner and as supervising physician I was immediately available for consultation/collaboration.   Jye Fariss E Yuvia Plant, MD 03/10/13 0728 

## 2013-03-10 NOTE — ED Notes (Signed)
Wait time advised 

## 2013-03-10 NOTE — ED Notes (Signed)
Here yesterday for stomach virus.  Head is hurting, sob, ? Anxiety, back of neck and shoulders hurt.

## 2013-03-11 ENCOUNTER — Emergency Department (HOSPITAL_COMMUNITY): Payer: Medicaid Other

## 2013-03-11 LAB — URINALYSIS, ROUTINE W REFLEX MICROSCOPIC
Leukocytes, UA: NEGATIVE
Nitrite: NEGATIVE
Protein, ur: NEGATIVE mg/dL
Urobilinogen, UA: 1 mg/dL (ref 0.0–1.0)

## 2013-03-11 LAB — PREGNANCY, URINE: Preg Test, Ur: NEGATIVE

## 2013-03-11 MED ORDER — KETOROLAC TROMETHAMINE 60 MG/2ML IM SOLN
60.0000 mg | Freq: Once | INTRAMUSCULAR | Status: AC
  Administered 2013-03-11: 60 mg via INTRAMUSCULAR
  Filled 2013-03-11: qty 2

## 2013-03-11 MED ORDER — MECLIZINE HCL 25 MG PO TABS
25.0000 mg | ORAL_TABLET | Freq: Once | ORAL | Status: AC
  Administered 2013-03-11: 25 mg via ORAL
  Filled 2013-03-11: qty 1

## 2013-03-11 NOTE — ED Provider Notes (Signed)
History     CSN: [id number]  Arrival date & time 03/10/13  1942   First MD Initiated Contact with Patient 03/11/13 0006      Chief Complaint  Patient presents with  . Headache  . Migraine  . Anxiety    (Consider location/radiation/quality/duration/timing/severity/associated sxs/prior treatment) HPI Comments: Patient presents with complaints of headache, neck pain, feels dizzy for the past two days.  She was seen here last night and was told she had a stomach virus.  Was given fluids and sent home.    Patient is a 27 y.o. female presenting with headaches. The history is provided by the patient.  Headache Pain location:  Generalized Quality:  Stabbing Radiates to:  Does not radiate Onset quality:  Gradual Duration:  2 days Timing:  Constant Progression:  Worsening Chronicity:  New Relieved by:  Nothing Worsened by:  Neck movement Ineffective treatments:  None tried Associated symptoms: neck pain   Associated symptoms: no fever and no focal weakness     Past Medical History  Diagnosis Date  . Acid reflux   . Hypoglycemia     No past surgical history on file.  No family history on file.  History  Substance Use Topics  . Smoking status: Never Smoker   . Smokeless tobacco: Never Used  . Alcohol Use: No    OB History   Grav Para Term Preterm Abortions TAB SAB Ect Mult Living   4 4 4       4       Review of Systems  Constitutional: Negative for fever.  HENT: Positive for neck pain.   Neurological: Positive for headaches. Negative for focal weakness.  All other systems reviewed and are negative.    Allergies  Review of patient's allergies indicates no known allergies.  Home Medications   Current Outpatient Rx  Name  Route  Sig  Dispense  Refill  . cholecalciferol (VITAMIN D) 1000 UNITS tablet   Oral   Take 1,000 Units by mouth daily.         Marland Kitchen ibuprofen (ADVIL,MOTRIN) 200 MG tablet   Oral   Take 400 mg by mouth daily as needed for pain.           . Multiple Vitamin (MULTIVITAMIN WITH MINERALS) TABS   Oral   Take 1 tablet by mouth daily. One a Day women's           BP 103/64  Pulse 60  Temp(Src) 98.1 F (36.7 C) (Oral)  Resp 14  SpO2 98%  LMP 02/17/2013  Physical Exam  Nursing note and vitals reviewed. Constitutional: She is oriented to person, place, and time. She appears well-developed and well-nourished. No distress.  HENT:  Head: Normocephalic and atraumatic.  Mouth/Throat: Oropharynx is clear and moist.  Eyes: Pupils are equal, round, and reactive to light.  Neck: Normal range of motion. Neck supple.  Cardiovascular: Normal rate and regular rhythm.  Exam reveals no gallop and no friction rub.   No murmur heard. Pulmonary/Chest: Effort normal and breath sounds normal. No respiratory distress. She has no wheezes.  Abdominal: Soft. Bowel sounds are normal. She exhibits no distension. There is no tenderness.  Musculoskeletal: Normal range of motion.  Neurological: She is alert and oriented to person, place, and time. No cranial nerve deficit. She exhibits normal muscle tone. Coordination normal.  Skin: Skin is warm and dry. She is not diaphoretic.    ED Course  Procedures (including critical care time)  Labs Reviewed  CBC -  Abnormal; Notable for the following:    Hemoglobin 11.7 (*)    HCT 35.6 (*)    All other components within normal limits  BASIC METABOLIC PANEL  URINALYSIS, ROUTINE W REFLEX MICROSCOPIC  PREGNANCY, URINE   No results found.   No diagnosis found.    MDM  The labs and ua are unremarkable.  I suspect a viral etiology to these two ed visits.  She is feeling better with meds in the ED.  She will be discharged, to return prn.        Geoffery Lyons, MD 03/11/13 [phone]

## 2013-07-15 ENCOUNTER — Emergency Department (HOSPITAL_COMMUNITY)
Admission: EM | Admit: 2013-07-15 | Discharge: 2013-07-15 | Disposition: A | Discharge status: Home / Self Care | Payer: Medicaid Other | Attending: Emergency Medicine | Admitting: Emergency Medicine

## 2013-07-15 ENCOUNTER — Encounter (HOSPITAL_COMMUNITY): Payer: Self-pay | Admitting: Emergency Medicine

## 2013-07-15 ENCOUNTER — Emergency Department (HOSPITAL_COMMUNITY): Payer: Medicaid Other

## 2013-07-15 ENCOUNTER — Emergency Department (HOSPITAL_COMMUNITY)
Admission: EM | Admit: 2013-07-15 | Discharge: 2013-07-15 | Disposition: A | Discharge status: Home / Self Care | Payer: Medicaid Other | Source: Home / Self Care | Attending: Family Medicine | Admitting: Family Medicine

## 2013-07-15 DIAGNOSIS — R1084 Generalized abdominal pain: Secondary | ICD-10-CM

## 2013-07-15 DIAGNOSIS — K5900 Constipation, unspecified: Secondary | ICD-10-CM | POA: Insufficient documentation

## 2013-07-15 DIAGNOSIS — Z862 Personal history of diseases of the blood and blood-forming organs and certain disorders involving the immune mechanism: Secondary | ICD-10-CM | POA: Insufficient documentation

## 2013-07-15 DIAGNOSIS — G8929 Other chronic pain: Secondary | ICD-10-CM

## 2013-07-15 DIAGNOSIS — Z8639 Personal history of other endocrine, nutritional and metabolic disease: Secondary | ICD-10-CM | POA: Insufficient documentation

## 2013-07-15 DIAGNOSIS — Z79899 Other long term (current) drug therapy: Secondary | ICD-10-CM | POA: Insufficient documentation

## 2013-07-15 LAB — COMPREHENSIVE METABOLIC PANEL
Alkaline Phosphatase: 70 U/L (ref 39–117)
BUN: 19 mg/dL (ref 6–23)
CO2: 26 mEq/L (ref 19–32)
Chloride: 102 mEq/L (ref 96–112)
Creatinine, Ser: 0.71 mg/dL (ref 0.50–1.10)
GFR calc Af Amer: >90 mL/min (ref >90)
GFR calc non Af Amer: >90 mL/min (ref >90)
Glucose, Bld: 87 mg/dL (ref 70–99)
Potassium: 4 mEq/L (ref 3.5–5.1)
Total Bilirubin: 0.4 mg/dL (ref 0.3–1.2)

## 2013-07-15 LAB — URINALYSIS, ROUTINE W REFLEX MICROSCOPIC
Bilirubin Urine: NEGATIVE
Glucose, UA: NEGATIVE mg/dL
Ketones, ur: NEGATIVE mg/dL
Leukocytes, UA: NEGATIVE
Nitrite: NEGATIVE
Protein, ur: NEGATIVE mg/dL
pH: 6.5 (ref 5.0–8.0)

## 2013-07-15 LAB — CBC WITH DIFFERENTIAL/PLATELET
HCT: 38 % (ref 36.0–46.0)
Hemoglobin: 12.6 g/dL (ref 12.0–15.0)
Lymphocytes Relative: 23 % (ref 12–46)
Lymphs Abs: 2.5 10*3/uL (ref 0.7–4.0)
Monocytes Absolute: 0.6 10*3/uL (ref 0.1–1.0)
Monocytes Relative: 6 % (ref 3–12)
Neutro Abs: 6 10*3/uL (ref 1.7–7.7)
Neutrophils Relative %: 56 % (ref 43–77)
RBC: 4.42 MIL/uL (ref 3.87–5.11)
RDW: 13.4 % (ref 11.5–15.5)
WBC: 10.8 10*3/uL — ABNORMAL HIGH (ref 4.0–10.5)

## 2013-07-15 LAB — URINE MICROSCOPIC-ADD ON

## 2013-07-15 LAB — POCT URINALYSIS DIP (DEVICE)
Bilirubin Urine: NEGATIVE
Glucose, UA: NEGATIVE mg/dL
Ketones, ur: NEGATIVE mg/dL
Leukocytes, UA: NEGATIVE
Nitrite: NEGATIVE
pH: 7 (ref 5.0–8.0)

## 2013-07-15 LAB — LIPASE, BLOOD: Lipase: 50 U/L (ref 11–59)

## 2013-07-15 LAB — POCT PREGNANCY, URINE: Preg Test, Ur: NEGATIVE

## 2013-07-15 MED ORDER — FAMOTIDINE 20 MG PO TABS
20.0000 mg | ORAL_TABLET | Freq: Once | ORAL | Status: AC
  Administered 2013-07-15: 20 mg via ORAL
  Filled 2013-07-15: qty 1

## 2013-07-15 MED ORDER — GI COCKTAIL ~~LOC~~
30.0000 mL | Freq: Once | ORAL | Status: AC
  Administered 2013-07-15: 30 mL via ORAL
  Filled 2013-07-15: qty 30

## 2013-07-15 NOTE — ED Notes (Signed)
The pt is c/o abd pain for 2 years intermittently with fllare-ups.  Constipated for 3 weeks.  Nauseated.  lmp sept 16.  She reports that she gave a urine at triage but it cannot be found

## 2013-07-15 NOTE — ED Notes (Signed)
UCC Tx. Abdominal pain. Hx of fatty stool per PT. Endorses flatus and abdominal pressure. NO constipation or urinary Sx. Ambulatory at triage

## 2013-07-15 NOTE — ED Notes (Signed)
C/o 3 week duration of upper abdominal pain, vomiting, bowel problems

## 2013-07-15 NOTE — ED Provider Notes (Signed)
CSN: [id number]     Arrival date & time 07/15/13  1652 History   First MD Initiated Contact with Patient 07/15/13 2106     Chief Complaint  Patient presents with  . Abdominal Pain   (Consider location/radiation/quality/duration/timing/severity/associated sxs/prior Treatment) The history is provided by the patient.  Tonya Yates is a 27 y.o. female hx of reflux here with abdominal pain. Epigastric abdominal pain intermittently for the last 3 weeks. Has been constipated. Denies fever or chills or vomiting. Denies urinary symptoms. Sent from urgent care for evaluation. Denies vaginal discharge.    Past Medical History  Diagnosis Date  . Acid reflux   . Hypoglycemia    History reviewed. No pertinent past surgical history. History reviewed. No pertinent family history. History  Substance Use Topics  . Smoking status: Never Smoker   . Smokeless tobacco: Never Used  . Alcohol Use: No   OB History   Grav Para Term Preterm Abortions TAB SAB Ect Mult Living   4 4 4       4      Review of Systems  Gastrointestinal: Positive for abdominal pain.  All other systems reviewed and are negative.    Allergies  Review of patient's allergies indicates no known allergies.  Home Medications   Current Outpatient Rx  Name  Route  Sig  Dispense  Refill  . cholecalciferol (VITAMIN D) 1000 UNITS tablet   Oral   Take 1,000 Units by mouth daily.         Marland Kitchen ibuprofen (ADVIL,MOTRIN) 200 MG tablet   Oral   Take 400 mg by mouth daily as needed for pain.           BP 115/61  Pulse 57  Temp(Src) 97.5 F (36.4 C) (Oral)  Resp 16  SpO2 100%  LMP 06/21/2013 Physical Exam  Nursing note and vitals reviewed. Constitutional: She appears well-developed and well-nourished.  Slightly anxious   HENT:  Head: Normocephalic.  Mouth/Throat: Oropharynx is clear and moist.  Eyes: Conjunctivae are normal. Pupils are equal, round, and reactive to light.  Neck: Normal range of motion. Neck supple.   Cardiovascular: Normal rate, regular rhythm and normal heart sounds.   Pulmonary/Chest: Effort normal and breath sounds normal. No respiratory distress. She has no wheezes. She has no rales.  Abdominal: Soft. Bowel sounds are normal. She exhibits no distension. There is no tenderness. There is no rebound and no guarding.  No CVAT   Musculoskeletal: Normal range of motion.  Neurological: She is alert.  Skin: Skin is warm.  Psychiatric: She has a normal mood and affect. Her behavior is normal. Judgment and thought content normal.    ED Course  Procedures (including critical care time) Labs Review Labs Reviewed  CBC WITH DIFFERENTIAL - Abnormal; Notable for the following:    WBC 10.8 (*)    Eosinophils Relative 14 (*)    Eosinophils Absolute 1.5 (*)    All other components within normal limits  COMPREHENSIVE METABOLIC PANEL - Abnormal; Notable for the following:    Calcium 10.6 (*)    All other components within normal limits  URINALYSIS, ROUTINE W REFLEX MICROSCOPIC - Abnormal; Notable for the following:    Hgb urine dipstick TRACE (*)    All other components within normal limits  LIPASE, BLOOD  URINE MICROSCOPIC-ADD ON   Imaging Review Dg Abd Acute W/chest  07/15/2013   CLINICAL DATA:  Abdominal pain for several months. Nausea.  EXAM: ACUTE ABDOMEN SERIES (ABDOMEN 2 VIEW & CHEST 1  VIEW)  COMPARISON:  Chest dated 11/23/2011.  FINDINGS: The heart remains normal in size and the lungs are clear. Normal bowel gas pattern without free peritoneal air. Mild scoliosis.  IMPRESSION: No acute abnormality.   Electronically Signed   By: Gordan Payment M.D.   On: 07/15/2013 21:57    EKG Interpretation   None       MDM  No diagnosis found. Tonya Yates is a 27 y.o. female here with abdominal pain. Likely constipation vs GERD. Will get xray.   11:17 PM Xray showed some constipation. Labs unremarkable. Stable for d/c.     Richardean Canal, MD 07/15/13 [phone]

## 2013-07-15 NOTE — ED Provider Notes (Signed)
CSN: [id number]     Arrival date & time 07/15/13  1542 History   First MD Initiated Contact with Patient 07/15/13 1612     Chief Complaint  Patient presents with  . Abdominal Pain   (Consider location/radiation/quality/duration/timing/severity/associated sxs/prior Treatment) Patient is a 27 y.o. female presenting with abdominal pain. The history is provided by the patient.  Abdominal Pain This is a chronic problem. The current episode started more than 1 week ago (3wk h/o current sx.). The problem has not changed since onset.Associated symptoms include abdominal pain. Pertinent negatives include no chest pain. Associated symptoms comments: Has 4 children, h/o etoh abuse age 49-26.h/o gerd..    Past Medical History  Diagnosis Date  . Acid reflux   . Hypoglycemia    History reviewed. No pertinent past surgical history. History reviewed. No pertinent family history. History  Substance Use Topics  . Smoking status: Never Smoker   . Smokeless tobacco: Never Used  . Alcohol Use: No   OB History   Grav Para Term Preterm Abortions TAB SAB Ect Mult Living   4 4 4       4      Review of Systems  Constitutional: Negative.   Respiratory: Negative.   Cardiovascular: Negative for chest pain.  Gastrointestinal: Positive for nausea and abdominal pain. Negative for vomiting, diarrhea and constipation.  Genitourinary: Negative.     Allergies  Review of patient's allergies indicates no known allergies.  Home Medications   Current Outpatient Rx  Name  Route  Sig  Dispense  Refill  . cholecalciferol (VITAMIN D) 1000 UNITS tablet   Oral   Take 1,000 Units by mouth daily.         Marland Kitchen ibuprofen (ADVIL,MOTRIN) 200 MG tablet   Oral   Take 400 mg by mouth daily as needed for pain.          . Multiple Vitamin (MULTIVITAMIN WITH MINERALS) TABS   Oral   Take 1 tablet by mouth daily. One a Day women's          BP 119/72  Pulse 70  Temp(Src) 99.3 F (37.4 C) (Oral)  Resp 16  SpO2  100%  LMP 06/21/2013 Physical Exam  Nursing note and vitals reviewed. Constitutional: She is oriented to person, place, and time. She appears well-developed and well-nourished.  HENT:  Right Ear: External ear normal.  Left Ear: External ear normal.  Mouth/Throat: Oropharynx is clear and moist.  Neck: Normal range of motion. Neck supple.  Cardiovascular: Normal rate, regular rhythm, normal heart sounds and intact distal pulses.   Pulmonary/Chest: Effort normal and breath sounds normal.  Abdominal: Soft. Bowel sounds are normal. She exhibits no distension and no mass. There is tenderness in the epigastric area and periumbilical area. There is no rigidity, no rebound, no guarding, no CVA tenderness, no tenderness at McBurney's point and negative Murphy's sign.  Lymphadenopathy:    She has no cervical adenopathy.  Neurological: She is alert and oriented to person, place, and time.  Skin: Skin is warm and dry.    ED Course  Procedures (including critical care time) Labs Review Labs Reviewed  POCT URINALYSIS DIP (DEVICE) - Abnormal; Notable for the following:    Hgb urine dipstick TRACE (*)    All other components within normal limits  POCT PREGNANCY, URINE   Imaging Review No results found.  EKG Interpretation     Ventricular Rate:    PR Interval:    QRS Duration:   QT Interval:  QTC Calculation:   R Axis:     Text Interpretation:              MDM  Sent for eval of abd pain for 3 wks, likely IBS, h/o etoh abuse, possible pancreatitis., h/o gerd and neg gb eval. U/a and upreg neg.    Linna Hoff, MD 07/15/13 [phone]

## 2014-08-07 ENCOUNTER — Encounter (HOSPITAL_COMMUNITY): Payer: Self-pay | Admitting: Emergency Medicine

## 2014-08-20 ENCOUNTER — Emergency Department (HOSPITAL_COMMUNITY)
Admission: EM | Admit: 2014-08-20 | Discharge: 2014-08-20 | Discharge status: Against Medical Advice | Payer: Medicaid Other | Attending: Emergency Medicine | Admitting: Emergency Medicine

## 2014-08-20 ENCOUNTER — Encounter (HOSPITAL_COMMUNITY): Payer: Self-pay

## 2014-08-20 DIAGNOSIS — J029 Acute pharyngitis, unspecified: Secondary | ICD-10-CM | POA: Insufficient documentation

## 2014-08-20 DIAGNOSIS — Z8719 Personal history of other diseases of the digestive system: Secondary | ICD-10-CM | POA: Diagnosis not present

## 2014-08-20 DIAGNOSIS — R079 Chest pain, unspecified: Secondary | ICD-10-CM | POA: Insufficient documentation

## 2014-08-20 DIAGNOSIS — Z8639 Personal history of other endocrine, nutritional and metabolic disease: Secondary | ICD-10-CM | POA: Diagnosis not present

## 2014-08-20 LAB — RAPID STREP SCREEN (MED CTR MEBANE ONLY): Streptococcus, Group A Screen (Direct): NEGATIVE

## 2014-08-20 NOTE — ED Provider Notes (Signed)
Patient left before evaluation.  Doug SouSam Lyriq Finerty, MD 08/20/14 [phone]

## 2014-08-20 NOTE — ED Notes (Signed)
Pt left, not in room, reported to secretary she had to leave.  Dr. Melene MullerBatista aware.

## 2014-08-20 NOTE — ED Notes (Signed)
Pt here for sore throat with white stuff on her throat. Pt states she started having cp yesterday also. Pt asked in triage how long the wait was cause she was going to go to [facility] since she was having CP. Informed pt they would send her back here.

## 2014-08-20 NOTE — ED Provider Notes (Signed)
I assigned myself to this patient, however she left her exam room prior to me being able to evaluate her. Nursing confirmed that the patient left.   Tonya Yates, Tonya Yates   Tonya Jaros, MD 08/20/14 1726  Doug SouSam Jacubowitz, MD 08/20/14 1728

## 2014-08-22 LAB — CULTURE, GROUP A STREP

## 2014-09-11 ENCOUNTER — Emergency Department (HOSPITAL_COMMUNITY)
Admission: EM | Admit: 2014-09-11 | Discharge: 2014-09-11 | Disposition: A | Discharge status: Home / Self Care | Payer: Medicaid Other | Attending: Emergency Medicine | Admitting: Emergency Medicine

## 2014-09-11 ENCOUNTER — Emergency Department (HOSPITAL_COMMUNITY): Payer: Medicaid Other

## 2014-09-11 ENCOUNTER — Encounter (HOSPITAL_COMMUNITY): Payer: Self-pay | Admitting: Family Medicine

## 2014-09-11 DIAGNOSIS — Z79899 Other long term (current) drug therapy: Secondary | ICD-10-CM | POA: Insufficient documentation

## 2014-09-11 DIAGNOSIS — R05 Cough: Secondary | ICD-10-CM | POA: Diagnosis present

## 2014-09-11 DIAGNOSIS — J029 Acute pharyngitis, unspecified: Secondary | ICD-10-CM | POA: Insufficient documentation

## 2014-09-11 DIAGNOSIS — Z792 Long term (current) use of antibiotics: Secondary | ICD-10-CM | POA: Diagnosis not present

## 2014-09-11 DIAGNOSIS — J069 Acute upper respiratory infection, unspecified: Secondary | ICD-10-CM

## 2014-09-11 DIAGNOSIS — Z791 Long term (current) use of non-steroidal anti-inflammatories (NSAID): Secondary | ICD-10-CM | POA: Diagnosis not present

## 2014-09-11 DIAGNOSIS — Z8639 Personal history of other endocrine, nutritional and metabolic disease: Secondary | ICD-10-CM | POA: Insufficient documentation

## 2014-09-11 DIAGNOSIS — Z8719 Personal history of other diseases of the digestive system: Secondary | ICD-10-CM | POA: Insufficient documentation

## 2014-09-11 LAB — RAPID STREP SCREEN (MED CTR MEBANE ONLY): STREPTOCOCCUS, GROUP A SCREEN (DIRECT): NEGATIVE

## 2014-09-11 MED ORDER — DM-GUAIFENESIN ER 30-600 MG PO TB12
1.0000 | ORAL_TABLET | Freq: Two times a day (BID) | ORAL | Status: DC
Start: 2014-09-11 — End: 2014-11-23

## 2014-09-11 MED ORDER — IBUPROFEN 600 MG PO TABS
600.0000 mg | ORAL_TABLET | Freq: Four times a day (QID) | ORAL | Status: DC | PRN
Start: 2014-09-11 — End: 2014-11-23

## 2014-09-11 MED ORDER — IBUPROFEN 800 MG PO TABS
800.0000 mg | ORAL_TABLET | Freq: Once | ORAL | Status: AC
  Administered 2014-09-11: 800 mg via ORAL
  Filled 2014-09-11: qty 1

## 2014-09-11 NOTE — ED Notes (Signed)
Pt here for sore throat, dizziness, hurts when she breathes, and dizziness. sts she was dx with strep throat and didn't take her abx like she was suppose to.

## 2014-09-11 NOTE — ED Notes (Signed)
Patient transported to X-ray 

## 2014-09-11 NOTE — Discharge Instructions (Signed)
Upper Respiratory Infection, Adult °An upper respiratory infection (URI) is also sometimes known as the common cold. The upper respiratory tract includes the nose, sinuses, throat, trachea, and bronchi. Bronchi are the airways leading to the lungs. Most people improve within 1 week, but symptoms can last up to 2 weeks. A residual cough may last even longer.  °CAUSES °Many different viruses can infect the tissues lining the upper respiratory tract. The tissues become irritated and inflamed and often become very moist. Mucus production is also common. A cold is contagious. You can easily spread the virus to others by oral contact. This includes kissing, sharing a glass, coughing, or sneezing. Touching your mouth or nose and then touching a surface, which is then touched by another person, can also spread the virus. °SYMPTOMS  °Symptoms typically develop 1 to 3 days after you come in contact with a cold virus. Symptoms vary from person to person. They may include: °· Runny nose. °· Sneezing. °· Nasal congestion. °· Sinus irritation. °· Sore throat. °· Loss of voice (laryngitis). °· Cough. °· Fatigue. °· Muscle aches. °· Loss of appetite. °· Headache. °· Low-grade fever. °DIAGNOSIS  °You might diagnose your own cold based on familiar symptoms, since most people get a cold 2 to 3 times a year. Your caregiver can confirm this based on your exam. Most importantly, your caregiver can check that your symptoms are not due to another disease such as strep throat, sinusitis, pneumonia, asthma, or epiglottitis. Blood tests, throat tests, and X-rays are not necessary to diagnose a common cold, but they may sometimes be helpful in excluding other more serious diseases. Your caregiver will decide if any further tests are required. °RISKS AND COMPLICATIONS  °You may be at risk for a more severe case of the common cold if you smoke cigarettes, have chronic heart disease (such as heart failure) or lung disease (such as asthma), or if  you have a weakened immune system. The very young and very old are also at risk for more serious infections. Bacterial sinusitis, middle ear infections, and bacterial pneumonia can complicate the common cold. The common cold can worsen asthma and chronic obstructive pulmonary disease (COPD). Sometimes, these complications can require emergency medical care and may be life-threatening. °PREVENTION  °The best way to protect against getting a cold is to practice good hygiene. Avoid oral or hand contact with people with cold symptoms. Wash your hands often if contact occurs. There is no clear evidence that vitamin C, vitamin E, echinacea, or exercise reduces the chance of developing a cold. However, it is always recommended to get plenty of rest and practice good nutrition. °TREATMENT  °Treatment is directed at relieving symptoms. There is no cure. Antibiotics are not effective, because the infection is caused by a virus, not by bacteria. Treatment may include: °· Increased fluid intake. Sports drinks offer valuable electrolytes, sugars, and fluids. °· Breathing heated mist or steam (vaporizer or shower). °· Eating chicken soup or other clear broths, and maintaining good nutrition. °· Getting plenty of rest. °· Using gargles or lozenges for comfort. °· Controlling fevers with ibuprofen or acetaminophen as directed by your caregiver. °· Increasing usage of your inhaler if you have asthma. °Zinc gel and zinc lozenges, taken in the first 24 hours of the common cold, can shorten the duration and lessen the severity of symptoms. Pain medicines may help with fever, muscle aches, and throat pain. A variety of non-prescription medicines are available to treat congestion and runny nose. Your caregiver   can make recommendations and may suggest nasal or lung inhalers for other symptoms.  °HOME CARE INSTRUCTIONS  °· Only take over-the-counter or prescription medicines for pain, discomfort, or fever as directed by your  caregiver. °· Use a warm mist humidifier or inhale steam from a shower to increase air moisture. This may keep secretions moist and make it easier to breathe. °· Drink enough water and fluids to keep your urine clear or pale yellow. °· Rest as needed. °· Return to work when your temperature has returned to normal or as your caregiver advises. You may need to stay home longer to avoid infecting others. You can also use a face mask and careful hand washing to prevent spread of the virus. °SEEK MEDICAL CARE IF:  °· After the first few days, you feel you are getting worse rather than better. °· You need your caregiver's advice about medicines to control symptoms. °· You develop chills, worsening shortness of breath, or brown or red sputum. These may be signs of pneumonia. °· You develop yellow or brown nasal discharge or pain in the face, especially when you bend forward. These may be signs of sinusitis. °· You develop a fever, swollen neck glands, pain with swallowing, or white areas in the back of your throat. These may be signs of strep throat. °SEEK IMMEDIATE MEDICAL CARE IF:  °· You have a fever. °· You develop severe or persistent headache, ear pain, sinus pain, or chest pain. °· You develop wheezing, a prolonged cough, cough up blood, or have a change in your usual mucus (if you have chronic lung disease). °· You develop sore muscles or a stiff neck. °Document Released: 03/18/2001 Document Revised: 12/15/2011 Document Reviewed: 12/28/2013 °ExitCare® Patient Information ©2015 [employer]. This information is not intended to replace advice given to you by your health care provider. Make sure you discuss any questions you have with your health care provider. ° °Pharyngitis °Pharyngitis is redness, pain, and swelling (inflammation) of your pharynx.  °CAUSES  °Pharyngitis is usually caused by infection. Most of the time, these infections are from viruses (viral) and are part of a cold. However, sometimes pharyngitis  is caused by bacteria (bacterial). Pharyngitis can also be caused by allergies. Viral pharyngitis may be spread from person to person by coughing, sneezing, and personal items or utensils (cups, forks, spoons, toothbrushes). Bacterial pharyngitis may be spread from person to person by more intimate contact, such as kissing.  °SIGNS AND SYMPTOMS  °Symptoms of pharyngitis include:   °· Sore throat.   °· Tiredness (fatigue).   °· Low-grade fever.   °· Headache. °· Joint pain and muscle aches. °· Skin rashes. °· Swollen lymph nodes. °· Plaque-like film on throat or tonsils (often seen with bacterial pharyngitis). °DIAGNOSIS  °Your health care provider will ask you questions about your illness and your symptoms. Your medical history, along with a physical exam, is often all that is needed to diagnose pharyngitis. Sometimes, a rapid strep test is done. Other lab tests may also be done, depending on the suspected cause.  °TREATMENT  °Viral pharyngitis will usually get better in 3-4 days without the use of medicine. Bacterial pharyngitis is treated with medicines that kill germs (antibiotics).  °HOME CARE INSTRUCTIONS  °· Drink enough water and fluids to keep your urine clear or pale yellow.   °· Only take over-the-counter or prescription medicines as directed by your health care provider:   °¨ If you are prescribed antibiotics, make sure you finish them even if you start to feel better.   °¨   Do not take aspirin.   °· Get lots of rest.   °· Gargle with 8 oz of salt water (½ tsp of salt per 1 qt of water) as often as every 1-2 hours to soothe your throat.   °· Throat lozenges (if you are not at risk for choking) or sprays may be used to soothe your throat. °SEEK MEDICAL CARE IF:  °· You have large, tender lumps in your neck. °· You have a rash. °· You cough up green, yellow-brown, or bloody spit. °SEEK IMMEDIATE MEDICAL CARE IF:  °· Your neck becomes stiff. °· You drool or are unable to swallow liquids. °· You vomit or are  unable to keep medicines or liquids down. °· You have severe pain that does not go away with the use of recommended medicines. °· You have trouble breathing (not caused by a stuffy nose). °MAKE SURE YOU:  °· Understand these instructions. °· Will watch your condition. °· Will get help right away if you are not doing well or get worse. °Document Released: 09/22/2005 Document Revised: 07/13/2013 Document Reviewed: 05/30/2013 °ExitCare® Patient Information ©2015 [employer]. This information is not intended to replace advice given to you by your health care provider. Make sure you discuss any questions you have with your health care provider. ° ° °Emergency Department Resource Guide °1) Find a Doctor and Pay Out of Pocket °Although you won't have to find out who is covered by your insurance plan, it is a good idea to ask around and get recommendations. You will then need to call the office and see if the doctor you have chosen will accept you as a new patient and what types of options they offer for patients who are self-pay. Some doctors offer discounts or will set up payment plans for their patients who do not have insurance, but you will need to ask so you aren't surprised when you get to your appointment. ° °2) Contact Your Local Health Department °Not all health departments have doctors that can see patients for sick visits, but many do, so it is worth a call to see if yours does. If you don't know where your local health department is, you can check in your phone book. The CDC also has a tool to help you locate your state's health department, and many state websites also have listings of all of their local health departments. ° °3) Find a Walk-in Clinic °If your illness is not likely to be very severe or complicated, you may want to try a walk in clinic. These are popping up all over the country in pharmacies, drugstores, and shopping centers. They're usually staffed by nurse practitioners or physician  assistants that have been trained to treat common illnesses and complaints. They're usually fairly quick and inexpensive. However, if you have serious medical issues or chronic medical problems, these are probably not your best option. ° °No Primary Care Doctor: °- Call [facility] at  832-8000 - they can help you locate a primary care doctor that  accepts your insurance, provides certain services, etc. °- Physician Referral Service- 1-800-533-3463 ° °Chronic Pain Problems: °Organization         Address  Phone   Notes  °[facility]  (336) 297-2271 Patients need to be referred by their primary care doctor.  ° °Medication Assistance: °Organization         Address  Phone   Notes  °[place] [facility] [address] °[place], [place] [address] (  336) 641-8030 --Must be a resident of [place] °-- Must have NO insurance coverage whatsoever (no Medicaid/ Medicare, etc.) °-- The pt. MUST have a primary care doctor that directs their care regularly and follows them in the community °  °[facility]  (866) 331-1348   °[employer]  (888) 892-1162   ° °Agencies that provide inexpensive medical care: °Organization         Address  Phone   Notes  °[facility]  (336) 832-8035   °[facility]    (336) 832-7272   °[facility] [address] °[place], [place] [address] (336) 832-4777   °[facility] [address], °[place] (336) 271-4999   °[facility]    (336) 373-0678   °[facility]    (336) 272-1050   °[facility] ° [address], [place] Phone:  (336) 832-4444, Fax:  (336) 832-4440 Hours of Operation:  9 am - 6 pm, M-F.  Also accepts Medicaid/Medicare and self-pay.  ° [facility] ° [address], [place] Phone: (336) [phone], Fax: (336) [phone]. Hours of Operation:  8:30 am - 5:30 pm, M-F.  Also accepts  Medicaid and self-pay.  °[facility] [place] [address], [place] Phone: (336) 878-6027   °[facility] [address], [place], [place] (336)723-1848, Ext. 123 Mondays & Thursdays: 7-9 AM.  First 15 patients are seen on a first come, first serve basis. °  ° °Medicaid-accepting [place] Providers: ° °Organization         Address  Phone   Notes  °[facility] [address], [place] (336) [phone] Also accepts self-pay patients.  °[facility] [address], [place] ° (336) 856-9996   °[facility] [address], [place] (336) 288-8857   °[facility] [address] [place] [address], [place] (336) 299-7000   °Veita Bland [address], [place]  ° (336) [phone] Only accepts [place] Access Medicaid patients after they have their name applied to their card.  ° °Self-Pay (no insurance) in [place]: ° °Organization         Address  Phone   Notes  °Sickle Cell Patients, [facility] [address], [place] (336) 832-1970   °[facility] [address], [place] (336) 832-4400   °[facility] ° [address], [place] (336) 992-4800   °[facility]/Dr. Osei-Bonsu ° [address] [place] [address], [place] or [address], [place] (336) [phone] Phone number for both [place] and [place] locations is the same.  °[facility] [address], [place] (336) 299-0000   °[facility] [address] [place] [address], [place] or [address] (336) 852-7530 °(336) 878-2260   °[facility] [address], [place] (336) 350-1642, phone; (336) 294-5005, fax Sees patients 1st and 3rd Saturday of every month.  Must not qualify for public or private insurance (i.e. Medicaid, Medicare, [place] Health Choice, Veterans' Benefits) • Household  income should be no more than 200% of the poverty level •The clinic cannot treat you if you are pregnant or think you are pregnant • Sexually transmitted diseases are not treated at the clinic.  ° ° °Dental Care: °Organization           Address  Phone  Notes  °[facility] [address], [place] (336) 641-6152 Accepts children up to age 21 who are enrolled in Medicaid or [place] Health Choice; pregnant women with a Medicaid card; and children who have applied for Medicaid or [place] Health Choice, but were declined, whose parents can pay a reduced fee at time of service.  °[facility] [place]  [address], [place] (336) 641-7733 Accepts children up to age 21 who are enrolled in Medicaid or [place] Health Choice; pregnant women with a Medicaid card; and children who have applied for Medicaid or [place] Health Choice, but were declined, whose parents can pay a reduced fee at time of service.  °[facility] ° [address], [place] (336) 641-4533 Patients are seen by appointment only. Walk-ins are not accepted. [facility] will see patients 18 years of age and older. °Monday - Tuesday (8am-5pm) °Most Wednesdays (8:30-5pm) °$30 per visit, cash only  °[facility] ° [address], [place] (336) [phone] Patients are seen by appointment only. Walk-ins are not accepted. [facility] will see patients 18 years of age and older. °One Wednesday Evening (Monthly: Volunteer Based).  $30 per visit, cash only  °[facility]  (919) [phone] for adults; Children under age 4, call Graduate Pediatric Dentistry at ([phone]) [phone]. Children aged 4-14, please call ([phone]) [phone] to request a pediatric application. ° Dental services are provided in all areas of dental care including fillings, crowns and bridges, complete and partial dentures, implants, gum  treatment, root canals, and extractions. Preventive care is also provided. Treatment is provided to both adults and children. °Patients are selected via a lottery and there is often a waiting list. °  °[facility] [address], °[place] ° (336) 763-8833 [email or web address] °  °[facility] [address], [place], [place] (336)723-1848, Ext. 123 Second and Fourth Thursday of each month, opens at 6:30 AM; Clinic ends at 9 AM.  Patients are seen on a first-come first-served basis, and a limited number are seen during each clinic.  ° °[facility] ° [address], [place], [place] (336) 723-7904   Eligibility Requirements °You must have lived in [place], [place], or [place] counties for at least the last three months. °  You cannot be eligible for state or federal sponsored healthcare insurance, including Veterans Administration, Medicaid, or Medicare. °  You generally cannot be eligible for healthcare insurance through your employer.  °  How to apply: °Eligibility screenings are held every Tuesday and Wednesday afternoon from 1:00 pm until 4:00 pm. You do not need an appointment for the interview!  °[facility] [address], [place], [place] 336-631-2330   °[facility]  336-342-8273   °[facility]  336-703-3100   °[facility]  336-570-6415   ° °Behavioral Health Resources in the Community: °Intensive Outpatient Programs °Organization         Address  Phone  Notes  °[facility] [address], [place], [place] 336-878-6098   °[facility] [address], [place], [place] 336-832-9800   °[facility] [address], [place], [place] ° 336-882-2125   °[facility] [address],  °[place], [place] 1-800-853-5163 or 336-641-4981   °Substance Abuse Resources °Organization           Address  Phone  Notes  °[facility]  336-882-2125   °[facility]  336-784-9470   °[facility]  336-285-9073   °[facility]  336-845-3988   °Residential & Outpatient Substance Abuse Program  1-800-659-3381   °Psychological Services °Organization         Address  Phone  Notes  °[facility]  336- 832-9600   °[facility]  336- 378-7881   °[facility] [address], [place] 1-800-853-5163 or 336-641-4981   ° °Mobile Crisis Teams °Organization         Address  Phone  Notes  °[facility], Mobile Crisis Care Unit  1-877-626-1772   °Assertive °[facility] ° [address] [place], [place] 336-834-9664   °Sharon DeEsch [address] °[place] 336-554-5454   ° °Self-Help/Support Groups °Organization         Address  Phone             Notes  °[facility] - variety of support groups  336- 373-1402 Call for more information  °Narcotics Anonymous (NA), [facility] [address], °[place] [address]  2 meetings at this location  ° °Residential Treatment Programs °Organization         Address  Phone  Notes  °[facility] [address],    °[place]  1-866-801-8205   °[facility] ° [address], [place], [place] 704-293-8524   °[facility] [address], [place] 336-845-3988 Admissions: 8am-3pm M-F  °[facility] [address]    °[place], [place] 336-841-1104   °[facility] [address], [place], [place] 336-379-7146   °[facility] [address]  °[place], [place] 336-285-9073   °[facility] - Intensive Outpatient [address], [place], [place] 336-852-3033   °[facility] [address]  °[place], [place] 1-877-615-2722 or 336-784-9470   °[facility] [address] [place], [place] 336-227-7417 Accepts Medicaid  °[facility] [address]  °[place] 1-800-659-3381 Substance Abuse/Addiction Treatment  ° °[place] Behavioral Health Resources °Organization         Address  Phone  Notes  °[facility]  (888) 581-9988   °Julie Brannon, PhD [address] [place], [place]   (336) 349-5553 or (336) 951-0000   °[facility]   [address] °[place], [place] (336) 349-4454   °[facility] [address], [place], [place] (336) 342-8316 Insurance/Medicaid/sponsorship through [facility]  °[facility] [address]                                    [place], [place] (336) 342-8316 Therapy/tele-psych/case  °[facility] [address]  ° [place], [place] (336) 349-2233    °Dr. Arfeen  (336) 349-4544   °[facility] [place]  [employer] [place] [facility] 1) [address],  °2) [address], [place] °3)  [address], [place] (336) 349-3220 °(336) 342-7768 ° °(336) 342-8140   °[place] Child Abuse [facility] (336) 342-1394 or (336) 342-3537 (After Hours)    ° ° ° °

## 2014-09-11 NOTE — ED Provider Notes (Signed)
CSN: [id number]     Arrival date & time 09/11/14  0712 History   First MD Initiated Contact with Patient 09/11/14 [phone]     Chief Complaint  Patient presents with  . Sore Throat  . Cough     (Consider location/radiation/quality/duration/timing/severity/associated sxs/prior Treatment) HPI  The patient reports that she's been sick for 2 weeks. She reports that she had strep throat. She reports that she first got diagnosed as not having strep throat, then she went back to her doctor and told it was positive. She reports that she was prescribed antibiotics and she took about 4 of her amoxicillin she reports that she drank a lot of tequila several days ago and now her throat hurts worse. She was very difficult to describe her amoxicillin use because she subsequently stated that she took it every few days and doesn't have much of the prescription left. She reports she's not doing better. She reports she also has a cough and when she is coughing it hurts her chest.  Past Medical History  Diagnosis Date  . Acid reflux   . Hypoglycemia    History reviewed. No pertinent past surgical history. History reviewed. No pertinent family history. History  Substance Use Topics  . Smoking status: Never Smoker   . Smokeless tobacco: Never Used  . Alcohol Use: No   OB History    Gravida Para Term Preterm AB TAB SAB Ectopic Multiple Living   4 4 4       4      Review of Systems Constitutional: No fevers, positive for chills GI: No vomiting no diarrhea.   Allergies  Review of patient's allergies indicates no known allergies.  Home Medications   Prior to Admission medications   Medication Sig Start Date End Date Taking? Authorizing Provider  amoxicillin (AMOXIL) 500 MG capsule Take 500 mg by mouth daily.    Yes Historical Provider, MD  cholecalciferol (VITAMIN D) 1000 UNITS tablet Take 1,000 Units by mouth daily.   Yes Historical Provider, MD  guaifenesin (ROBITUSSIN CHEST CONGESTION) 100 MG/5ML  syrup Take 200 mg by mouth 3 (three) times daily as needed for cough.   Yes Historical Provider, MD  Multiple Vitamins-Minerals (MULTIVITAMIN PO) Take 1 tablet by mouth daily.   Yes Historical Provider, MD  naproxen (NAPROSYN) 500 MG tablet Take 500 mg by mouth daily as needed for mild pain.   Yes Historical Provider, MD  vitamin E 400 UNIT capsule Take 400 Units by mouth daily.   Yes Historical Provider, MD  dextromethorphan-guaiFENesin (MUCINEX DM) 30-600 MG per 12 hr tablet Take 1 tablet by mouth 2 (two) times daily. 09/11/14   Arby BarretteMarcy Demarrion Meiklejohn, MD  ibuprofen (ADVIL,MOTRIN) 600 MG tablet Take 1 tablet (600 mg total) by mouth every 6 (six) hours as needed. 09/11/14   Arby BarretteMarcy Chessie Neuharth, MD   BP 117/73 mmHg  Pulse 65  Temp(Src) 97.3 F (36.3 C) (Oral)  Resp 20  Ht 5\' 1"  (1.549 m)  Wt 176 lb (79.833 kg)  BMI 33.27 kg/m2  SpO2 98%  LMP 08/28/2014 Physical Exam  Constitutional: She is oriented to person, place, and time. She appears well-developed and well-nourished.  The patient is well in appearance. She is no respiratory distress.  HENT:  Head: Normocephalic and atraumatic.  Right Ear: External ear normal.  Left Ear: External ear normal.  Mouth/Throat: Oropharyngeal exudate (tthe patient has moderately enlarged tonsils. They are symmetric. Small amount of exudate present. Posterior oropharynx is widely patent.) present.  Bilateral TMs normal without  erythema or bulging.  Eyes: Conjunctivae and EOM are normal. Pupils are equal, round, and reactive to light.  Neck: Neck supple.  Cardiovascular: Normal rate and normal heart sounds.   Pulmonary/Chest: Effort normal and breath sounds normal. No respiratory distress. She has no wheezes. She has no rales.  Lymphadenopathy:    She has no cervical adenopathy.  Neurological: She is alert and oriented to person, place, and time. Coordination normal.  Skin: Skin is warm and dry.  Psychiatric: She has a normal mood and affect.    ED Course   Procedures (including critical care time) Labs Review Labs Reviewed  RAPID STREP SCREEN  CULTURE, GROUP A STREP    Imaging Review Dg Chest 2 View  09/11/2014   CLINICAL DATA:  Cough.  Shortness of breath.  EXAM: CHEST  2 VIEW  COMPARISON:  07/15/2013  FINDINGS: The heart size and mediastinal contours are within normal limits. Both lungs are clear. The visualized skeletal structures are unremarkable.  IMPRESSION: No active cardiopulmonary disease.   Electronically Signed   By: Myles RosenthalJohn  Stahl M.D.   On: 09/11/2014 07:56     EKG Interpretation None      MDM   Final diagnoses:  Acute URI  Pharyngitis   Rapid strep was negative today. Reviewing the patient's prior strep 3 weeks ago does not show that she had a positive culture. The patient's chest x-ray is clear and she is in no respiratory distress. At this point time findings are consistent with a viral upper respiratory infection. Patient will be treated symptomatically.    Arby BarretteMarcy Anshu Wehner, MD 09/11/14 [phone]

## 2014-09-13 LAB — CULTURE, GROUP A STREP

## 2014-11-23 ENCOUNTER — Encounter (HOSPITAL_COMMUNITY): Payer: Self-pay

## 2014-11-23 ENCOUNTER — Emergency Department (HOSPITAL_COMMUNITY)
Admission: EM | Admit: 2014-11-23 | Discharge: 2014-11-23 | Disposition: A | Discharge status: Home / Self Care | Payer: Medicaid Other | Attending: Emergency Medicine | Admitting: Emergency Medicine

## 2014-11-23 DIAGNOSIS — N12 Tubulo-interstitial nephritis, not specified as acute or chronic: Secondary | ICD-10-CM | POA: Diagnosis not present

## 2014-11-23 DIAGNOSIS — N390 Urinary tract infection, site not specified: Secondary | ICD-10-CM | POA: Insufficient documentation

## 2014-11-23 DIAGNOSIS — Z8639 Personal history of other endocrine, nutritional and metabolic disease: Secondary | ICD-10-CM | POA: Insufficient documentation

## 2014-11-23 DIAGNOSIS — Z8719 Personal history of other diseases of the digestive system: Secondary | ICD-10-CM | POA: Diagnosis not present

## 2014-11-23 DIAGNOSIS — Z791 Long term (current) use of non-steroidal anti-inflammatories (NSAID): Secondary | ICD-10-CM | POA: Diagnosis not present

## 2014-11-23 DIAGNOSIS — Z792 Long term (current) use of antibiotics: Secondary | ICD-10-CM | POA: Diagnosis not present

## 2014-11-23 DIAGNOSIS — M549 Dorsalgia, unspecified: Secondary | ICD-10-CM | POA: Diagnosis present

## 2014-11-23 LAB — URINE MICROSCOPIC-ADD ON

## 2014-11-23 LAB — CBC WITH DIFFERENTIAL/PLATELET
BASOS ABS: 0 10*3/uL (ref 0.0–0.1)
Basophils Relative: 0 % (ref 0–1)
EOS ABS: 1.2 10*3/uL — AB (ref 0.0–0.7)
Eosinophils Relative: 6 % — ABNORMAL HIGH (ref 0–5)
HCT: 36.8 % (ref 36.0–46.0)
Hemoglobin: 12.4 g/dL (ref 12.0–15.0)
Lymphocytes Relative: 7 % — ABNORMAL LOW (ref 12–46)
Lymphs Abs: 1.4 10*3/uL (ref 0.7–4.0)
MCH: 29.5 pg (ref 26.0–34.0)
MCHC: 33.7 g/dL (ref 30.0–36.0)
MCV: 87.4 fL (ref 78.0–100.0)
Monocytes Absolute: 0.9 10*3/uL (ref 0.1–1.0)
Monocytes Relative: 5 % (ref 3–12)
NEUTROS PCT: 82 % — AB (ref 43–77)
Neutro Abs: 15.8 10*3/uL — ABNORMAL HIGH (ref 1.7–7.7)
PLATELETS: 265 10*3/uL (ref 150–400)
RBC: 4.21 MIL/uL (ref 3.87–5.11)
RDW: 13.7 % (ref 11.5–15.5)
WBC: 19.4 10*3/uL — ABNORMAL HIGH (ref 4.0–10.5)

## 2014-11-23 LAB — URINALYSIS, ROUTINE W REFLEX MICROSCOPIC
BILIRUBIN URINE: NEGATIVE
Glucose, UA: NEGATIVE mg/dL
Ketones, ur: NEGATIVE mg/dL
Nitrite: POSITIVE — AB
PROTEIN: 100 mg/dL — AB
Specific Gravity, Urine: 1.014 (ref 1.005–1.030)
UROBILINOGEN UA: 0.2 mg/dL (ref 0.0–1.0)
pH: 5.5 (ref 5.0–8.0)

## 2014-11-23 LAB — BASIC METABOLIC PANEL
Anion gap: 8 (ref 5–15)
BUN: 9 mg/dL (ref 6–23)
CHLORIDE: 104 mmol/L (ref 96–112)
CO2: 25 mmol/L (ref 19–32)
Calcium: 9.7 mg/dL (ref 8.4–10.5)
Creatinine, Ser: 0.84 mg/dL (ref 0.50–1.10)
GFR calc Af Amer: >90 mL/min (ref >90)
GFR calc non Af Amer: >90 mL/min (ref >90)
GLUCOSE: 119 mg/dL — AB (ref 70–99)
POTASSIUM: 3.8 mmol/L (ref 3.5–5.1)
Sodium: 137 mmol/L (ref 135–145)

## 2014-11-23 LAB — PREGNANCY, URINE: PREG TEST UR: NEGATIVE

## 2014-11-23 MED ORDER — SODIUM CHLORIDE 0.9 % IV BOLUS (SEPSIS)
1000.0000 mL | Freq: Once | INTRAVENOUS | Status: AC
  Administered 2014-11-23: 1000 mL via INTRAVENOUS

## 2014-11-23 MED ORDER — CIPROFLOXACIN HCL 500 MG PO TABS: 500.0000 mg | ORAL_TABLET | Freq: Two times a day (BID) | ORAL | Status: DC

## 2014-11-23 MED ORDER — NAPROXEN 500 MG PO TABS: 500.0000 mg | ORAL_TABLET | Freq: Two times a day (BID) | ORAL | Status: DC

## 2014-11-23 MED ORDER — ONDANSETRON 8 MG PO TBDP
8.0000 mg | ORAL_TABLET | Freq: Three times a day (TID) | ORAL | Status: DC | PRN
Start: 2014-11-23 — End: 2015-01-01

## 2014-11-23 MED ORDER — DEXTROSE 5 % IV SOLN
1.0000 g | Freq: Once | INTRAVENOUS | Status: AC
  Administered 2014-11-23: 1 g via INTRAVENOUS
  Filled 2014-11-23: qty 10

## 2014-11-23 MED ORDER — CIPROFLOXACIN IN D5W 400 MG/200ML IV SOLN
400.0000 mg | Freq: Once | INTRAVENOUS | Status: AC
  Administered 2014-11-23: 400 mg via INTRAVENOUS
  Filled 2014-11-23: qty 200

## 2014-11-23 NOTE — ED Notes (Signed)
Patient verbalizes understanding of discharge instructions, prescription medications, home care and follow up care. Patient ambulatory out of department at this time. 

## 2014-11-23 NOTE — ED Notes (Signed)
Patient ambulatory to restroom  ?

## 2014-11-23 NOTE — Discharge Instructions (Signed)
Take antibiotics as prescribed.  Take pain and nausea medication as needed.  Return to the ER if you are unable to keep down the antibiotics, if you have continued fever, vomiting despite the medication, or other concerns.  Follow up with your doctor for recheck in 3-5 days.  If you do not have a doctor, follow up with one of the numbers listed  PAIN ACETAMINOPHEN OXYCODONE  PAIN ACETAMINOPHEN OXYCODONE: You have been given a medication that contains acetaminophen and oxycodone.      This medication is used to relieve pain.     DO NOT take this medication if you have liver disease or drink alcohol on a daily basis.     DO NOT take this medication if you are taking other over-the-counter medications that contain Tylenol or acetaminophen (the active ingredient in Tylenol).     If you have side-effects that you think are caused by this medicine, tell your doctor.     DO NOT drink alcoholic beverages while taking this medicine.     If you become dizzy, sit or lie down at the first signs.  You should be careful going up and down stairs.     If you are pregnant or breastfeeding, notify your doctor before taking this medication.     Keep this medication out of the reach of children.  Always keep this medication in child-proof containers.  DO NOT give your medication to anyone else. This medication can be HABIT-FORMING.  Discontinue use when no longer needed and never give this medication to others.  You have been given a medication, or a prescription for a medication, that causes drowsiness or dizziness.  DO NOT drive a car, operate machinery, or perform jobs that require you to be alert until you know how you are going to react to this medicine.  THESE INSTRUCTIONS ARE NOT COMPREHENSIVE (complete):  Ask your pharmacist for additional information and precautions for this medication.   GI ANTIEMETIC  GI ANTIEMETIC: You have been given a prescription for a medication for nausea and  vomiting.      It is OK to take this medication if you are pregnant.  Be sure to tell your regular doctor or obstetrician [facility] doctor) that you have been taking this medication.     Take this medication as directed.     If you are taking phenobarbital, narcotic pain medications, antidepressants, or sleeping pills your dosage may need to be adjusted.  Be sure to inform your doctor of all the other medications that you are taking.     DO NOT take this medication if you have liver disease or heart disease.     DO NOT take pain killers (narcotic medication) unless specifically instructed to do so by your doctor     DO NOT drink alcoholic beverages while taking this medicine.     If you develop any reactions that you believe may be from the medication be sure to tell your doctor or return to the ER (Some reactions may include:  dizziness, shaking, visual disturbances, nervousness, fainting, rash).     If you become dizzy, sit or lie down at the first signs.  You should be careful going up and down stairs.     Keep this medication out of the reach of children.  Always keep this medication in child-proof containers.  DO NOT give your medication to anyone else. You have been given a medication, or a prescription for a medication, that causes drowsiness or dizziness.  DO NOT drive a car, operate machinery, ride a bike, or perform jobs that require you to be alert until you know how you are going to react to this medication.  THESE INSTRUCTIONS ARE NOT COMPREHENSIVE (complete):  Ask your pharmacist for additional information and precautions for this medication.   ANTIBIOTIC FLUOROQUINOLONES  ANTIBIOTIC FLUOROQUINOLONES: You have been prescribed an antibiotic that belongs to a class of drugs called Fluoroquinolones.      Take this medication exactly as prescribed.  If you forget a dose, do not double your next dose.     This medication treats many kinds of infections of the skin, bone, stomach,  brain, blood, lungs, ear, and urinary tract.  It also treats certain sexually transmitted diseases.     DO NOT take this medication if you have an allergy to it or to other similar medications.     Take this medication until it is gone.  DO NOT stop taking this medication after a few doses, even if you are feeling better.     Keep this medication out of the reach of children.  Always keep this medication in child-proof containers.  DO NOT give your medication to anyone else.     You may take this medication with food or milk.  To help prevent complications, drink extra fluid while taking this medication. Keep this medication away from children.  IT IS VERY IMPORTANT that you finish all the medication in this prescription, since the medicine is used to treat an ongoing infection in your body.  If you are taking any of the following medications, please notify your doctor  before taking this medication:      Blood Thinners (Coumadin), Cyclosporin, Arsenic Trioxide, Erythromycin, medicines for depression or mental illness such as Prozac, Amitriptyline, Imipramine, Haldol, Geodon, or Mellaril, anti-inflammatory drugs such as Ibuprofen, Aspirin, Aleve, or Relafen, steroids, diuretics or "water pills", or medications used to treat abnormal heart rhythms such as Amiodarone, Betapace, Corvert, Norpace, or Sotalol.     If you are using antacids, multivitamins, or sucralfate, take these medications at least 8 hours before or 4 hours after taking this medication. Make sure your doctor knows if you are pregnant or breastfeeding, have a history of heart disease, heart rhythm problems, kidney disease, liver disease, low potassium, stroke, seizures, or a family history of heart rhythm disorders.      Possible side-effects of this medication are an allergic reaction (itching or hives, swelling of the face or hands, chest tightness, trouble breathing), chest pain, heart rhythm changes, diarrhea, seizures,  yellowing of the eyes or skin, and unusual bleeding or bruising.  Please contact your doctor immediately if you experience any of these side-effects.     Some less serious side-effects are dizziness, nervousness, anxiety, agitation, muscle or joint pain, nausea or vomiting, sores or white patches in your mouth, and vaginal itching or discharge.  Notify your doctor if you experience any of these symptoms. THESE INSTRUCTIONS ARE NOT COMPREHENSIVE (complete):  Ask your pharmacist for additional information and precautions for this medication.   PYELONEPHRITIS  PYELONEPHRITIS: You have been diagnosed with pyelonephritis, also known as a kidney infection.  Pyelonephritis is a bacterial infection in your kidneys. Symptoms include fever, chills, nausea and vomiting, back pain on one or both sides, and sometimes difficulty urinating. You may also feel very weak. The diagnosis is made based on your symptoms and a test of your urine.  Pyelonephritis most commonly occurs when a lower urinary tract infection (bladder infection)  ascends (climbs up) the urinary tract and gets into the kidney. Early treatment of a bladder infection is important to prevent pyelonephritis and the possibility of kidney damage.  Pyelonephritis is treated with antibiotics, pain and fever medications, and fluids. Some patients require an "IV" if they have nausea or vomiting and cannot keep down the antibiotics, or if they arent improving after 2-3 days of oral (by mouth) medications.  Most patients do not need to be admitted to the hospital for pyelonephritis.  A few patients who dont do well with the oral (by mouth) antibiotics or become sicker despite treatment may need to return to be admitted to the hospital.  YOU SHOULD SEEK MEDICAL ATTENTION IMMEDIATELY, EITHER HERE OR AT THE NEAREST EMERGENCY DEPARTMENT, IF ANY OF THE FOLLOWING OCCURS:      Failure to improve after 2-3 days of antibiotics.     You develop nausea or  vomiting and are unable to keep down medications or fluids.     Increased weakness or lightheadedness.     You develop any progressive or worsening symptoms or any other concerns.  Urinary Tract Infection Urinary tract infections (UTIs) can develop anywhere along your urinary tract. Your urinary tract is your body's drainage system for removing wastes and extra water. Your urinary tract includes two kidneys, two ureters, a bladder, and a urethra. Your kidneys are a pair of bean-shaped organs. Each kidney is about the size of your fist. They are located below your ribs, one on each side of your spine. CAUSES Infections are caused by microbes, which are microscopic organisms, including fungi, viruses, and bacteria. These organisms are so small that they can only be seen through a microscope. Bacteria are the microbes that most commonly cause UTIs. SYMPTOMS  Symptoms of UTIs may vary by age and gender of the patient and by the location of the infection. Symptoms in young women typically include a frequent and intense urge to urinate and a painful, burning feeling in the bladder or urethra during urination. Older women and men are more likely to be tired, shaky, and weak and have muscle aches and abdominal pain. A fever may mean the infection is in your kidneys. Other symptoms of a kidney infection include pain in your back or sides below the ribs, nausea, and vomiting. DIAGNOSIS To diagnose a UTI, your caregiver will ask you about your symptoms. Your caregiver also will ask to provide a urine sample. The urine sample will be tested for bacteria and white blood cells. White blood cells are made by your body to help fight infection. TREATMENT  Typically, UTIs can be treated with medication. Because most UTIs are caused by a bacterial infection, they usually can be treated with the use of antibiotics. The choice of antibiotic and length of treatment depend on your symptoms and the type of bacteria causing  your infection. HOME CARE INSTRUCTIONS  If you were prescribed antibiotics, take them exactly as your caregiver instructs you. Finish the medication even if you feel better after you have only taken some of the medication.  Drink enough water and fluids to keep your urine clear or pale yellow.  Avoid caffeine, tea, and carbonated beverages. They tend to irritate your bladder.  Empty your bladder often. Avoid holding urine for long periods of time.  Empty your bladder before and after sexual intercourse.  After a bowel movement, women should cleanse from front to back. Use each tissue only once. SEEK MEDICAL CARE IF:   You have back pain.  You develop a fever.  Your symptoms do not begin to resolve within 3 days. SEEK IMMEDIATE MEDICAL CARE IF:   You have severe back pain or lower abdominal pain.  You develop chills.  You have nausea or vomiting.  You have continued burning or discomfort with urination. MAKE SURE YOU:   Understand these instructions.  Will watch your condition.  Will get help right away if you are not doing well or get worse. Document Released: 07/02/2005 Document Revised: 03/23/2012 Document Reviewed: 10/31/2011 [employer] Patient Information 2015 [employer] [place]. This information is not intended to replace advice given to you by your health care provider. Make sure you discuss any questions you have with your health care provider.

## 2014-11-23 NOTE — ED Notes (Signed)
Patient given soda at request

## 2014-11-23 NOTE — ED Notes (Signed)
Patient states she started having UTI symptoms 02/102016. Patient was seen at PCP and given ABX for UTI. Patient states she took ABX X2 days and stopped because she was feeling better. Patient states pain got worse and the ABX are not helping now. A&OX4. C/O pain to lower abdomin and lower back

## 2014-11-23 NOTE — ED Notes (Signed)
Patient resting in bed at this time, NAD noted at this time. No needs voiced.

## 2014-11-23 NOTE — ED Notes (Signed)
Bed: WA06 Expected date:  Expected time:  Means of arrival:  Comments: EMS/UTI/abdominal pain/N/V

## 2014-11-23 NOTE — ED Provider Notes (Signed)
CSN: [id number]     Arrival date & time 11/23/14  0154 History   First MD Initiated Contact with Patient 11/23/14 [phone]     Chief Complaint  Patient presents with  . Urinary Tract Infection     (Consider location/radiation/quality/duration/timing/severity/associated sxs/prior Treatment) HPI 29 year old female presents to emergency department with complaint of bilateral back pain and suprapubic pain along with nausea and vomiting.  Patient reports she's had worsening pain over the last 2 days, 3 hours of nausea and vomiting.  Patient reports vomiting started soon after taking an over-the-counter bladder medication.  Patient was seen by her primary care Dr. for urinary tract infection, also diagnosed with bacterial vaginosis on the 10th of this month.  Patient took Bactrim for 2 days, and felt better, so stopped taking the medication.  Patient will also placed on Flagyl for bacterial vaginosis, and stopped taking that when she no longer had a bad smell to her vaginal discharge.  She denies any fevers.  Patient started back taking her Bactrim 2 days ago, but does not feel that it is helping.  She now reports some vaginal itching for which she is started over-the-counter Monistat. Past Medical History  Diagnosis Date  . Acid reflux   . Hypoglycemia    History reviewed. No pertinent past surgical history. History reviewed. No pertinent family history. History  Substance Use Topics  . Smoking status: Never Smoker   . Smokeless tobacco: Never Used  . Alcohol Use: No   OB History    Gravida Para Term Preterm AB TAB SAB Ectopic Multiple Living   4 4 4       4      Review of Systems   See History of Present Illness; otherwise all other systems are reviewed and negative  Allergies  Review of patient's allergies indicates no known allergies.  Home Medications   Prior to Admission medications   Medication Sig Start Date End Date Taking? Authorizing Provider  metroNIDAZOLE (FLAGYL) 500 MG  tablet Take 1 tablet by mouth 2 (two) times daily. For 7 days 11/15/14  Yes Historical Provider, MD  sulfamethoxazole-trimethoprim (BACTRIM DS,SEPTRA DS) 800-160 MG per tablet Take 1 tablet by mouth 2 (two) times daily. For 7 days 11/15/14  Yes Historical Provider, MD  dextromethorphan-guaiFENesin (MUCINEX DM) 30-600 MG per 12 hr tablet Take 1 tablet by mouth 2 (two) times daily. Patient not taking: Reported on 11/23/2014 09/11/14   Arby BarretteMarcy Pfeiffer, MD  ibuprofen (ADVIL,MOTRIN) 600 MG tablet Take 1 tablet (600 mg total) by mouth every 6 (six) hours as needed. Patient not taking: Reported on 11/23/2014 09/11/14   Arby BarretteMarcy Pfeiffer, MD   BP 131/73 mmHg  Pulse 79  Temp(Src) 98.1 F (36.7 C) (Oral)  Resp 20  Ht 5\' 2"  (1.575 m)  Wt 178 lb (80.74 kg)  BMI 32.55 kg/m2  SpO2 99%  LMP 10/27/2014 Physical Exam  Constitutional: She is oriented to person, place, and time. She appears well-developed and well-nourished.  HENT:  Head: Normocephalic and atraumatic.  Nose: Nose normal.  Mouth/Throat: Oropharynx is clear and moist.  Eyes: Conjunctivae and EOM are normal. Pupils are equal, round, and reactive to light.  Neck: Normal range of motion. Neck supple. No JVD present. No tracheal deviation present. No thyromegaly present.  Cardiovascular: Normal rate, regular rhythm, normal heart sounds and intact distal pulses.  Exam reveals no gallop and no friction rub.   No murmur heard. Pulmonary/Chest: Effort normal and breath sounds normal. No stridor. No respiratory distress. She has no  wheezes. She has no rales. She exhibits no tenderness.  Abdominal: Soft. Bowel sounds are normal. She exhibits no distension and no mass. There is tenderness (patient has tenderness to the suprapubic region and bilateral CVA tenderness). There is no rebound and no guarding.  Musculoskeletal: Normal range of motion. She exhibits no edema or tenderness.  Lymphadenopathy:    She has no cervical adenopathy.  Neurological: She is  alert and oriented to person, place, and time. She displays normal reflexes. She exhibits normal muscle tone. Coordination normal.  Skin: Skin is warm and dry. No rash noted. No erythema. No pallor.  Psychiatric: She has a normal mood and affect. Her behavior is normal. Judgment and thought content normal.  Nursing note and vitals reviewed.   ED Course  Procedures (including critical care time) Labs Review Labs Reviewed  URINALYSIS, ROUTINE W REFLEX MICROSCOPIC - Abnormal; Notable for the following:    Color, Urine ORANGE (*)    APPearance CLOUDY (*)    Hgb urine dipstick LARGE (*)    Protein, ur 100 (*)    Nitrite POSITIVE (*)    Leukocytes, UA LARGE (*)    All other components within normal limits  URINE MICROSCOPIC-ADD ON - Abnormal; Notable for the following:    Squamous Epithelial / LPF FEW (*)    Bacteria, UA FEW (*)    All other components within normal limits  CBC WITH DIFFERENTIAL/PLATELET - Abnormal; Notable for the following:    WBC 19.4 (*)    Neutrophils Relative % 82 (*)    Neutro Abs 15.8 (*)    Lymphocytes Relative 7 (*)    Eosinophils Relative 6 (*)    Eosinophils Absolute 1.2 (*)    All other components within normal limits  BASIC METABOLIC PANEL - Abnormal; Notable for the following:    Glucose, Bld 119 (*)    All other components within normal limits  URINE CULTURE  PREGNANCY, URINE    Imaging Review No results found.   EKG Interpretation None      MDM   Final diagnoses:  UTI (urinary tract infection) with pyuria  Pyelonephritis    29 year old female with incomplete treatment of urinary tract infection.  Most likely has early pyelonephritis.  Patient is not septic appearing is not febrile or tachycardic.  Nausea and vomiting developed after taking over-the-counter Azo.  Plan is to send urine for culture.  IV hydration with Zofran.  We'll treat with Rocephin and Cipro and DC home on with Cipro.  Patient has had extensive counseling on the  importance of taking all of an antibiotic when prescribed by provider.  Patient acknowledges the importance of taking all the head and    Olivia Mackie, MD 11/23/14 [phone]

## 2014-11-26 LAB — URINE CULTURE

## 2014-11-27 ENCOUNTER — Telehealth (HOSPITAL_BASED_OUTPATIENT_CLINIC_OR_DEPARTMENT_OTHER): Payer: Self-pay | Admitting: Emergency Medicine

## 2014-11-27 NOTE — Telephone Encounter (Signed)
Post ED Visit - Positive Culture Follow-up  Culture report reviewed by antimicrobial stewardship pharmacist: []  Wes Dulaney, Pharm.D., BCPS [x]  Celedonio MiyamotoJeremy Frens, Pharm.D., BCPS []  Georgina PillionElizabeth Martin, Pharm.D., BCPS []  OrionMinh Pham, [address]Ph[address]D., BCPS, AAHIVP []  Estella HuskMichelle Turner, Pharm.D., BCPS, AAHIVP []  Elder CyphersLorie Poole, [address]Ph[address]D., BCPS  Positive urine culture E. Coli Treated with ciprofloxacin , metronidazole, bactrim DS, organism sensitive to the same and no further patient follow-up is required at this time.  Berle MullMiller, Albirta Rhinehart 11/27/2014, 9:52 AM

## 2014-12-19 ENCOUNTER — Encounter (HOSPITAL_COMMUNITY): Payer: Self-pay | Admitting: *Deleted

## 2014-12-19 ENCOUNTER — Emergency Department (HOSPITAL_COMMUNITY)
Admission: EM | Admit: 2014-12-19 | Discharge: 2014-12-19 | Discharge status: Against Medical Advice | Payer: Medicaid Other | Attending: Emergency Medicine | Admitting: Emergency Medicine

## 2014-12-19 DIAGNOSIS — R1030 Lower abdominal pain, unspecified: Secondary | ICD-10-CM | POA: Diagnosis not present

## 2014-12-19 DIAGNOSIS — R112 Nausea with vomiting, unspecified: Secondary | ICD-10-CM | POA: Diagnosis present

## 2014-12-19 DIAGNOSIS — Z3202 Encounter for pregnancy test, result negative: Secondary | ICD-10-CM | POA: Insufficient documentation

## 2014-12-19 LAB — COMPREHENSIVE METABOLIC PANEL
ALBUMIN: 3.7 g/dL (ref 3.5–5.2)
ALK PHOS: 64 U/L (ref 39–117)
ALT: 22 U/L (ref 0–35)
ANION GAP: 5 (ref 5–15)
AST: 32 U/L (ref 0–37)
BUN: 7 mg/dL (ref 6–23)
CHLORIDE: 106 mmol/L (ref 96–112)
CO2: 27 mmol/L (ref 19–32)
Calcium: 9.6 mg/dL (ref 8.4–10.5)
Creatinine, Ser: 0.73 mg/dL (ref 0.50–1.10)
GFR calc non Af Amer: >90 mL/min (ref >90)
Glucose, Bld: 110 mg/dL — ABNORMAL HIGH (ref 70–99)
POTASSIUM: 4.6 mmol/L (ref 3.5–5.1)
SODIUM: 138 mmol/L (ref 135–145)
TOTAL PROTEIN: 6.6 g/dL (ref 6.0–8.3)
Total Bilirubin: 0.9 mg/dL (ref 0.3–1.2)

## 2014-12-19 LAB — CBC WITH DIFFERENTIAL/PLATELET
BASOS ABS: 0.1 10*3/uL (ref 0.0–0.1)
BASOS PCT: 1 % (ref 0–1)
Eosinophils Absolute: 1 10*3/uL — ABNORMAL HIGH (ref 0.0–0.7)
Eosinophils Relative: 14 % — ABNORMAL HIGH (ref 0–5)
HCT: 40.2 % (ref 36.0–46.0)
Hemoglobin: 13.1 g/dL (ref 12.0–15.0)
Lymphocytes Relative: 28 % (ref 12–46)
Lymphs Abs: 2.2 10*3/uL (ref 0.7–4.0)
MCH: 28.4 pg (ref 26.0–34.0)
MCHC: 32.6 g/dL (ref 30.0–36.0)
MCV: 87.2 fL (ref 78.0–100.0)
Monocytes Absolute: 0.9 10*3/uL (ref 0.1–1.0)
Monocytes Relative: 11 % (ref 3–12)
NEUTROS ABS: 3.5 10*3/uL (ref 1.7–7.7)
Neutrophils Relative %: 46 % (ref 43–77)
PLATELETS: 211 10*3/uL (ref 150–400)
RBC: 4.61 MIL/uL (ref 3.87–5.11)
RDW: 13.4 % (ref 11.5–15.5)
WBC: 7.6 10*3/uL (ref 4.0–10.5)

## 2014-12-19 LAB — I-STAT BETA HCG BLOOD, ED (MC, WL, AP ONLY): I-stat hCG, quantitative: <5 m[IU]/mL (ref <5)

## 2014-12-19 NOTE — ED Notes (Signed)
Pt sts is leaving prior to being seen.  Advised to stay and be evaluated but sts she is leaving.  A/O X4, ambulatory and in NAD

## 2014-12-19 NOTE — ED Notes (Signed)
Pt states that she has had n/v and lower abdominal cramping. Pt denies vaginal discharge or bleeding. Pt states that she took 4 pregnancy test with faint positive lines.

## 2015-01-01 ENCOUNTER — Encounter (HOSPITAL_COMMUNITY): Payer: Self-pay | Admitting: Emergency Medicine

## 2015-01-01 ENCOUNTER — Emergency Department (INDEPENDENT_AMBULATORY_CARE_PROVIDER_SITE_OTHER)
Admission: EM | Admit: 2015-01-01 | Discharge: 2015-01-01 | Disposition: A | Discharge status: Home / Self Care | Payer: Medicaid Other | Source: Home / Self Care | Attending: Emergency Medicine | Admitting: Emergency Medicine

## 2015-01-01 DIAGNOSIS — J020 Streptococcal pharyngitis: Secondary | ICD-10-CM | POA: Diagnosis not present

## 2015-01-01 LAB — POCT RAPID STREP A: Streptococcus, Group A Screen (Direct): POSITIVE — AB

## 2015-01-01 MED ORDER — TRAMADOL HCL 50 MG PO TABS: 50.0000 mg | ORAL_TABLET | Freq: Four times a day (QID) | ORAL | Status: DC | PRN

## 2015-01-01 MED ORDER — CEFDINIR 300 MG PO CAPS: 300.0000 mg | ORAL_CAPSULE | Freq: Two times a day (BID) | ORAL | Status: DC

## 2015-01-01 NOTE — ED Provider Notes (Signed)
CSN: [id number]     Arrival date & time 01/01/15  1756 History   First MD Initiated Contact with Patient 01/01/15 1945     Chief Complaint  Patient presents with  . Sore Throat   (Consider location/radiation/quality/duration/timing/severity/associated sxs/prior Treatment) HPI She is a 29 year old woman here for evaluation of sore throat. Her symptoms started yesterday. She reports severe sore throat, body aches, headaches, malaise. She denies any significant nasal congestion or cough. She has had fevers at home. She states she has had strep multiple times in the last few months. She has recently been on amoxicillin, penicillin shot, and clindamycin.  Past Medical History  Diagnosis Date  . Acid reflux   . Hypoglycemia    History reviewed. No pertinent past surgical history. History reviewed. No pertinent family history. History  Substance Use Topics  . Smoking status: Never Smoker   . Smokeless tobacco: Never Used  . Alcohol Use: No   OB History    Gravida Para Term Preterm AB TAB SAB Ectopic Multiple Living   4 4 4       4      Review of Systems As in history of present illness Allergies  Review of patient's allergies indicates no known allergies.  Home Medications   Prior to Admission medications   Medication Sig Start Date End Date Taking? Authorizing Provider  cefdinir (OMNICEF) 300 MG capsule Take 1 capsule (300 mg total) by mouth 2 (two) times daily. 01/01/15   Charm RingsErin J Honig, MD  naproxen (NAPROSYN) 500 MG tablet Take 1 tablet (500 mg total) by mouth 2 (two) times daily. Patient not taking: Reported on 12/19/2014 11/23/14   Marisa Severinlga Otter, MD  traMADol (ULTRAM) 50 MG tablet Take 1 tablet (50 mg total) by mouth every 6 (six) hours as needed. 01/01/15   Charm RingsErin J Honig, MD   BP 115/73 mmHg  Pulse 88  Temp(Src) 99.4 F (37.4 C) (Oral)  Resp 16  SpO2 98%  LMP 12/20/2014 Physical Exam  Constitutional: She is oriented to person, place, and time. She appears well-developed and  well-nourished. She appears distressed (looks ill).  HENT:  Head: Normocephalic and atraumatic.  Mouth/Throat: Oropharyngeal exudate present.  Neck: Neck supple.  Cardiovascular: Normal rate, regular rhythm and normal heart sounds.   No murmur heard. Pulmonary/Chest: Effort normal and breath sounds normal. No respiratory distress. She has no wheezes. She has no rales.  Lymphadenopathy:    She has cervical adenopathy.  Neurological: She is alert and oriented to person, place, and time.    ED Course  Procedures (including critical care time) Labs Review Labs Reviewed  POCT RAPID STREP A ([facility] ONLY) - Abnormal; Notable for the following:    Streptococcus, Group A Screen (Direct) POSITIVE (*)    All other components within normal limits    Imaging Review No results found.   MDM   1. Strep pharyngitis    We'll treat with Omnicef. Tramadol for pain. Tylenol and ibuprofen for fever. Follow-up as needed.    Charm RingsErin J Honig, MD 01/01/15 2102

## 2015-01-01 NOTE — ED Notes (Signed)
Pt states that she has had a sore throat for 4 days

## 2015-01-01 NOTE — Discharge Instructions (Signed)
You and your daughter are passing strep throat back and forth. Take Omnicef twice a day for the next 10 days. Use tramadol for the next few days for pain. Use Tylenol or ibuprofen as needed for fevers. Follow-up if no improvement after finishing the antibiotic.

## 2015-02-17 ENCOUNTER — Emergency Department (HOSPITAL_COMMUNITY)
Admission: EM | Admit: 2015-02-17 | Discharge: 2015-02-17 | Disposition: A | Discharge status: Home / Self Care | Payer: Medicaid Other | Attending: Emergency Medicine | Admitting: Emergency Medicine

## 2015-02-17 ENCOUNTER — Encounter (HOSPITAL_COMMUNITY): Payer: Self-pay | Admitting: Emergency Medicine

## 2015-02-17 DIAGNOSIS — S0081XA Abrasion of other part of head, initial encounter: Secondary | ICD-10-CM | POA: Insufficient documentation

## 2015-02-17 DIAGNOSIS — Z3A01 Less than 8 weeks gestation of pregnancy: Secondary | ICD-10-CM | POA: Insufficient documentation

## 2015-02-17 DIAGNOSIS — Y9389 Activity, other specified: Secondary | ICD-10-CM | POA: Diagnosis not present

## 2015-02-17 DIAGNOSIS — Z8639 Personal history of other endocrine, nutritional and metabolic disease: Secondary | ICD-10-CM | POA: Diagnosis not present

## 2015-02-17 DIAGNOSIS — Y9289 Other specified places as the place of occurrence of the external cause: Secondary | ICD-10-CM | POA: Diagnosis not present

## 2015-02-17 DIAGNOSIS — O9A211 Injury, poisoning and certain other consequences of external causes complicating pregnancy, first trimester: Secondary | ICD-10-CM | POA: Diagnosis not present

## 2015-02-17 DIAGNOSIS — Y998 Other external cause status: Secondary | ICD-10-CM | POA: Diagnosis not present

## 2015-02-17 DIAGNOSIS — S3991XA Unspecified injury of abdomen, initial encounter: Secondary | ICD-10-CM | POA: Insufficient documentation

## 2015-02-17 DIAGNOSIS — Z79899 Other long term (current) drug therapy: Secondary | ICD-10-CM | POA: Diagnosis not present

## 2015-02-17 DIAGNOSIS — Z8719 Personal history of other diseases of the digestive system: Secondary | ICD-10-CM | POA: Insufficient documentation

## 2015-02-17 MED ORDER — ACETAMINOPHEN 325 MG PO TABS
650.0000 mg | ORAL_TABLET | Freq: Once | ORAL | Status: AC
  Administered 2015-02-17: 650 mg via ORAL
  Filled 2015-02-17: qty 2

## 2015-02-17 MED ORDER — ACETAMINOPHEN 500 MG PO TABS: 500.0000 mg | ORAL_TABLET | Freq: Four times a day (QID) | ORAL | Status: DC | PRN
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
## 2015-02-17 NOTE — Discharge Instructions (Signed)
Assault, General  Assault includes any behavior, whether intentional or reckless, which results in bodily injury to another person and/or damage to property. Included in this would be any behavior, intentional or reckless, that by its nature would be understood (interpreted) by a reasonable person as intent to harm another person or to damage his/her property. Threats may be oral or written. They may be communicated through regular mail, computer, fax, or phone. These threats may be direct or implied.  FORMS OF ASSAULT INCLUDE:  · Physically assaulting a person. This includes physical threats to inflict physical harm as well as:  ¨ Slapping.  ¨ Hitting.  ¨ Poking.  ¨ Kicking.  ¨ Punching.  ¨ Pushing.  · Arson.  · Sabotage.  · Equipment vandalism.  · Damaging or destroying property.  · Throwing or hitting objects.  · Displaying a weapon or an object that appears to be a weapon in a threatening manner.  ¨ Carrying a firearm of any kind.  ¨ Using a weapon to harm someone.  · Using greater physical size/strength to intimidate another.  ¨ Making intimidating or threatening gestures.  ¨ Bullying.  ¨ Hazing.  · Intimidating, threatening, hostile, or abusive language directed toward another person.  ¨ It communicates the intention to engage in violence against that person. And it leads a reasonable person to expect that violent behavior may occur.  · Stalking another person.  IF IT HAPPENS AGAIN:  · Immediately call for emergency help (911 in [place]  · If someone poses clear and immediate danger to you, seek legal authorities to have a protective or restraining order put in place.  · Less threatening assaults can at least be reported to authorities.  STEPS TO TAKE IF A SEXUAL ASSAULT HAS HAPPENED  · Go to an area of safety. This may include a shelter or staying with a friend. Stay away from the area where you have been attacked. A large percentage of sexual assaults are caused by a friend, relative or associate.  · If  medications were given by your caregiver, take them as directed for the full length of time prescribed.  · Only take over-the-counter or prescription medicines for pain, discomfort, or fever as directed by your caregiver.  · If you have come in contact with a sexual disease, find out if you are to be tested again. If your caregiver is concerned about the HIV/AIDS virus, he/she may require you to have continued testing for several months.  · For the protection of your privacy, test results can not be given over the phone. Make sure you receive the results of your test. If your test results are not back during your visit, make an appointment with your caregiver to find out the results. Do not assume everything is normal if you have not heard from your caregiver or the medical facility. It is important for you to follow up on all of your test results.  · File appropriate papers with authorities. This is important in all assaults, even if it has occurred in a family or by a friend.  SEEK MEDICAL CARE IF:  · You have new problems because of your injuries.  · You have problems that may be because of the medicine you are taking, such as:  ¨ Rash.  ¨ Itching.  ¨ Swelling.  ¨ Trouble breathing.  · You develop belly (abdominal) pain, feel sick to your stomach (nausea) or are vomiting.  · You begin to run a temperature.  · You   need supportive care or referral to a rape crisis center. These are centers with trained personnel who can help you get through this ordeal.  SEEK IMMEDIATE MEDICAL CARE IF:  · You are afraid of being threatened, beaten, or abused. In [place] call 911.  · You receive new injuries related to abuse.  · You develop severe pain in any area injured in the assault or have any change in your condition that concerns you.  · You faint or lose consciousness.  · You develop chest pain or shortness of breath.  Document Released: 09/22/2005 Document Revised: 12/15/2011 Document Reviewed: 05/10/2008  [employer]® Patient  Information ©2015 [employer]. This information is not intended to replace advice given to you by your health care provider. Make sure you discuss any questions you have with your health care provider.

## 2015-02-26 ENCOUNTER — Encounter (HOSPITAL_COMMUNITY): Payer: Self-pay | Admitting: Emergency Medicine

## 2015-02-26 ENCOUNTER — Emergency Department (HOSPITAL_COMMUNITY)
Admission: EM | Admit: 2015-02-26 | Discharge: 2015-02-27 | Disposition: A | Discharge status: Home / Self Care | Payer: Medicaid Other | Attending: Emergency Medicine | Admitting: Emergency Medicine

## 2015-02-26 DIAGNOSIS — O9989 Other specified diseases and conditions complicating pregnancy, childbirth and the puerperium: Secondary | ICD-10-CM | POA: Insufficient documentation

## 2015-02-26 DIAGNOSIS — Z8719 Personal history of other diseases of the digestive system: Secondary | ICD-10-CM | POA: Insufficient documentation

## 2015-02-26 DIAGNOSIS — Z8639 Personal history of other endocrine, nutritional and metabolic disease: Secondary | ICD-10-CM | POA: Insufficient documentation

## 2015-02-26 DIAGNOSIS — Z3A01 Less than 8 weeks gestation of pregnancy: Secondary | ICD-10-CM | POA: Diagnosis not present

## 2015-02-26 DIAGNOSIS — Z792 Long term (current) use of antibiotics: Secondary | ICD-10-CM | POA: Diagnosis not present

## 2015-02-26 DIAGNOSIS — O210 Mild hyperemesis gravidarum: Secondary | ICD-10-CM | POA: Diagnosis not present

## 2015-02-26 DIAGNOSIS — O418X10 Other specified disorders of amniotic fluid and membranes, first trimester, not applicable or unspecified: Secondary | ICD-10-CM

## 2015-02-26 DIAGNOSIS — R102 Pelvic and perineal pain: Secondary | ICD-10-CM

## 2015-02-26 DIAGNOSIS — R1030 Lower abdominal pain, unspecified: Secondary | ICD-10-CM | POA: Diagnosis not present

## 2015-02-26 DIAGNOSIS — O208 Other hemorrhage in early pregnancy: Secondary | ICD-10-CM | POA: Diagnosis not present

## 2015-02-26 DIAGNOSIS — O468X1 Other antepartum hemorrhage, first trimester: Secondary | ICD-10-CM

## 2015-02-26 DIAGNOSIS — Z3491 Encounter for supervision of normal pregnancy, unspecified, first trimester: Secondary | ICD-10-CM

## 2015-02-26 LAB — COMPREHENSIVE METABOLIC PANEL
ALT: 17 U/L (ref 14–54)
ANION GAP: 5 (ref 5–15)
AST: 19 U/L (ref 15–41)
Albumin: 3.2 g/dL — ABNORMAL LOW (ref 3.5–5.0)
Alkaline Phosphatase: 60 U/L (ref 38–126)
BUN: 7 mg/dL (ref 6–20)
CO2: 25 mmol/L (ref 22–32)
CREATININE: 0.63 mg/dL (ref 0.44–1.00)
Calcium: 9.8 mg/dL (ref 8.9–10.3)
Chloride: 107 mmol/L (ref 101–111)
GFR calc Af Amer: >60 mL/min (ref >60)
GFR calc non Af Amer: >60 mL/min (ref >60)
GLUCOSE: 89 mg/dL (ref 65–99)
POTASSIUM: 3.7 mmol/L (ref 3.5–5.1)
Sodium: 137 mmol/L (ref 135–145)
TOTAL PROTEIN: 5.9 g/dL — AB (ref 6.5–8.1)
Total Bilirubin: 0.2 mg/dL — ABNORMAL LOW (ref 0.3–1.2)

## 2015-02-26 LAB — URINALYSIS, ROUTINE W REFLEX MICROSCOPIC
Bilirubin Urine: NEGATIVE
GLUCOSE, UA: NEGATIVE mg/dL
Ketones, ur: NEGATIVE mg/dL
LEUKOCYTES UA: NEGATIVE
NITRITE: NEGATIVE
Protein, ur: NEGATIVE mg/dL
SPECIFIC GRAVITY, URINE: 1.024 (ref 1.005–1.030)
Urobilinogen, UA: 0.2 mg/dL (ref 0.0–1.0)
pH: 6.5 (ref 5.0–8.0)

## 2015-02-26 LAB — CBC WITH DIFFERENTIAL/PLATELET
BASOS PCT: 0 % (ref 0–1)
Basophils Absolute: 0 10*3/uL (ref 0.0–0.1)
EOS ABS: 1.5 10*3/uL — AB (ref 0.0–0.7)
Eosinophils Relative: 14 % — ABNORMAL HIGH (ref 0–5)
HEMATOCRIT: 36.1 % (ref 36.0–46.0)
Hemoglobin: 12 g/dL (ref 12.0–15.0)
Lymphocytes Relative: 18 % (ref 12–46)
Lymphs Abs: 1.9 10*3/uL (ref 0.7–4.0)
MCH: 29.3 pg (ref 26.0–34.0)
MCHC: 33.2 g/dL (ref 30.0–36.0)
MCV: 88.3 fL (ref 78.0–100.0)
MONO ABS: 0.7 10*3/uL (ref 0.1–1.0)
Monocytes Relative: 7 % (ref 3–12)
Neutro Abs: 6.3 10*3/uL (ref 1.7–7.7)
Neutrophils Relative %: 61 % (ref 43–77)
Platelets: 262 10*3/uL (ref 150–400)
RBC: 4.09 MIL/uL (ref 3.87–5.11)
RDW: 13.9 % (ref 11.5–15.5)
WBC: 10.5 10*3/uL (ref 4.0–10.5)

## 2015-02-26 LAB — URINE MICROSCOPIC-ADD ON

## 2015-02-26 LAB — HCG, QUANTITATIVE, PREGNANCY: HCG, BETA CHAIN, QUANT, S: 14828 m[IU]/mL — AB (ref <5)

## 2015-02-26 LAB — I-STAT BETA HCG BLOOD, ED (MC, WL, AP ONLY): I-stat hCG, quantitative: >2000 m[IU]/mL — ABNORMAL HIGH (ref <5)

## 2015-02-26 LAB — POC URINE PREG, ED: PREG TEST UR: POSITIVE — AB
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
## 2015-02-26 NOTE — ED Notes (Signed)
Pelvic cart setup bedside. 
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
## 2015-02-27 ENCOUNTER — Emergency Department (HOSPITAL_COMMUNITY): Payer: Medicaid Other

## 2015-02-27 LAB — WET PREP, GENITAL
Trich, Wet Prep: NONE SEEN
Yeast Wet Prep HPF POC: NONE SEEN

## 2015-02-27 LAB — RPR: RPR Ser Ql: NONREACTIVE

## 2015-02-27 LAB — GC/CHLAMYDIA PROBE AMP (~~LOC~~) NOT AT ARMC
Chlamydia: NEGATIVE
Neisseria Gonorrhea: NEGATIVE

## 2015-02-27 LAB — HIV ANTIBODY (ROUTINE TESTING W REFLEX): HIV SCREEN 4TH GENERATION: NONREACTIVE

## 2015-02-27 MED ORDER — METOCLOPRAMIDE HCL 5 MG/ML IJ SOLN
10.0000 mg | Freq: Once | INTRAMUSCULAR | Status: AC
  Administered 2015-02-27: 10 mg via INTRAMUSCULAR
  Filled 2015-02-27: qty 2

## 2015-02-27 MED ORDER — ACETAMINOPHEN 325 MG PO TABS
325.0000 mg | ORAL_TABLET | Freq: Once | ORAL | Status: AC
  Administered 2015-02-27: 325 mg via ORAL
  Filled 2015-02-27: qty 1

## 2015-02-27 NOTE — Discharge Instructions (Signed)
Your ultrasound today showed a pregnancy within your uterus.  There is a small amount of bleeding in between your placenta and the uterus.  This is not uncommon.  You will need follow-up with OB for recheck of this within the next 1-2 weeks.   First Trimester of Pregnancy The first trimester of pregnancy is from week 1 until the end of week 12 (months 1 through 3). A week after a sperm fertilizes an egg, the egg will implant on the wall of the uterus. This embryo will begin to develop into a baby. Genes from you and your partner are forming the baby. The female genes determine whether the baby is a boy or a girl. At 6-8 weeks, the eyes and face are formed, and the heartbeat can be seen on ultrasound. At the end of 12 weeks, all the baby's organs are formed.  Now that you are pregnant, you will want to do everything you can to have a healthy baby. Two of the most important things are to get good prenatal care and to follow your health care provider's instructions. Prenatal care is all the medical care you receive before the baby's birth. This care will help prevent, find, and treat any problems during the pregnancy and childbirth. BODY CHANGES Your body goes through many changes during pregnancy. The changes vary from woman to woman.   You may gain or lose a couple of pounds at first.  You may feel sick to your stomach (nauseous) and throw up (vomit). If the vomiting is uncontrollable, call your health care provider.  You may tire easily.  You may develop headaches that can be relieved by medicines approved by your health care provider.  You may urinate more often. Painful urination may mean you have a bladder infection.  You may develop heartburn as a result of your pregnancy.  You may develop constipation because certain hormones are causing the muscles that push waste through your intestines to slow down.  You may develop hemorrhoids or swollen, bulging veins (varicose veins).  Your breasts  may begin to grow larger and become tender. Your nipples may stick out more, and the tissue that surrounds them (areola) may become darker.  Your gums may bleed and may be sensitive to brushing and flossing.  Dark spots or blotches (chloasma, mask of pregnancy) may develop on your face. This will likely fade after the baby is born.  Your menstrual periods will stop.  You may have a loss of appetite.  You may develop cravings for certain kinds of food.  You may have changes in your emotions from day to day, such as being excited to be pregnant or being concerned that something may go wrong with the pregnancy and baby.  You may have more vivid and strange dreams.  You may have changes in your hair. These can include thickening of your hair, rapid growth, and changes in texture. Some women also have hair loss during or after pregnancy, or hair that feels dry or thin. Your hair will most likely return to normal after your baby is born. WHAT TO EXPECT AT YOUR PRENATAL VISITS During a routine prenatal visit:  You will be weighed to make sure you and the baby are growing normally.  Your blood pressure will be taken.  Your abdomen will be measured to track your baby's growth.  The fetal heartbeat will be listened to starting around week 10 or 12 of your pregnancy.  Test results from any previous visits will be  discussed. Your health care provider may ask you:  How you are feeling.  If you are feeling the baby move.  If you have had any abnormal symptoms, such as leaking fluid, bleeding, severe headaches, or abdominal cramping.  If you have any questions. Other tests that may be performed during your first trimester include:  Blood tests to find your blood type and to check for the presence of any previous infections. They will also be used to check for low iron levels (anemia) and Rh antibodies. Later in the pregnancy, blood tests for diabetes will be done along with other tests if  problems develop.  Urine tests to check for infections, diabetes, or protein in the urine.  An ultrasound to confirm the proper growth and development of the baby.  An amniocentesis to check for possible genetic problems.  Fetal screens for spina bifida and Down syndrome.  You may need other tests to make sure you and the baby are doing well. HOME CARE INSTRUCTIONS  Medicines  Follow your health care provider's instructions regarding medicine use. Specific medicines may be either safe or unsafe to take during pregnancy.  Take your prenatal vitamins as directed.  If you develop constipation, try taking a stool softener if your health care provider approves. Diet  Eat regular, well-balanced meals. Choose a variety of foods, such as meat or vegetable-based protein, fish, milk and low-fat dairy products, vegetables, fruits, and whole grain breads and cereals. Your health care provider will help you determine the amount of weight gain that is right for you.  Avoid raw meat and uncooked cheese. These carry germs that can cause birth defects in the baby.  Eating four or five small meals rather than three large meals a day may help relieve nausea and vomiting. If you start to feel nauseous, eating a few soda crackers can be helpful. Drinking liquids between meals instead of during meals also seems to help nausea and vomiting.  If you develop constipation, eat more high-fiber foods, such as fresh vegetables or fruit and whole grains. Drink enough fluids to keep your urine clear or pale yellow. Activity and Exercise  Exercise only as directed by your health care provider. Exercising will help you:  Control your weight.  Stay in shape.  Be prepared for labor and delivery.  Experiencing pain or cramping in the lower abdomen or low back is a good sign that you should stop exercising. Check with your health care provider before continuing normal exercises.  Try to avoid standing for long  periods of time. Move your legs often if you must stand in one place for a long time.  Avoid heavy lifting.  Wear low-heeled shoes, and practice good posture.  You may continue to have sex unless your health care provider directs you otherwise. Relief of Pain or Discomfort  Wear a good support bra for breast tenderness.   Take warm sitz baths to soothe any pain or discomfort caused by hemorrhoids. Use hemorrhoid cream if your health care provider approves.   Rest with your legs elevated if you have leg cramps or low back pain.  If you develop varicose veins in your legs, wear support hose. Elevate your feet for 15 minutes, 3-4 times a day. Limit salt in your diet. Prenatal Care  Schedule your prenatal visits by the twelfth week of pregnancy. They are usually scheduled monthly at first, then more often in the last 2 months before delivery.  Write down your questions. Take them to your prenatal  visits.  Keep all your prenatal visits as directed by your health care provider. Safety  Wear your seat belt at all times when driving.  Make a list of emergency phone numbers, including numbers for family, friends, the hospital, and police and fire departments. General Tips  Ask your health care provider for a referral to a local prenatal education class. Begin classes no later than at the beginning of month 6 of your pregnancy.  Ask for help if you have counseling or nutritional needs during pregnancy. Your health care provider can offer advice or refer you to specialists for help with various needs.  Do not use hot tubs, steam rooms, or saunas.  Do not douche or use tampons or scented sanitary pads.  Do not cross your legs for long periods of time.  Avoid cat litter boxes and soil used by cats. These carry germs that can cause birth defects in the baby and possibly loss of the fetus by miscarriage or stillbirth.  Avoid all smoking, herbs, alcohol, and medicines not prescribed by  your health care provider. Chemicals in these affect the formation and growth of the baby.  Schedule a dentist appointment. At home, brush your teeth with a soft toothbrush and be gentle when you floss. SEEK MEDICAL CARE IF:   You have dizziness.  You have mild pelvic cramps, pelvic pressure, or nagging pain in the abdominal area.  You have persistent nausea, vomiting, or diarrhea.  You have a bad smelling vaginal discharge.  You have pain with urination.  You notice increased swelling in your face, hands, legs, or ankles. SEEK IMMEDIATE MEDICAL CARE IF:   You have a fever.  You are leaking fluid from your vagina.  You have spotting or bleeding from your vagina.  You have severe abdominal cramping or pain.  You have rapid weight gain or loss.  You vomit blood or material that looks like coffee grounds.  You are exposed to [place] measles and have never had them.  You are exposed to fifth disease or chickenpox.  You develop a severe headache.  You have shortness of breath.  You have any kind of trauma, such as from a fall or a car accident. Document Released: 09/16/2001 Document Revised: 02/06/2014 Document Reviewed: 08/02/2013 [facility] Patient Information 2015 [place], [place]. This information is not intended to replace advice given to you by your health care provider. Make sure you discuss any questions you have with your health care provider.  What Do I Need to Know About Injuries During Pregnancy? Injuries can happen during pregnancy. Minor falls and accidents usually do not harm you or your baby. However, any injury should be reported to your doctor. WHAT CAN I DO TO PROTECT MYSELF FROM INJURIES?  Remove rugs and loose objects on the floor.  Wear comfortable shoes that have a good grip. Do not wear high-heeled shoes.  Always wear your seat belt. The lap belt should be below your belly. Always practice safe driving.  Do not ride on a motorcycle.  Do not  participate in high-impact activities or sports.  Avoid:  Walking on wet or slippery floors.  Fires.  Starting fires.  Lifting heavy pots of boiling or hot liquids.  Fixing electrical problems.  Only take medicine as told by your doctor.  Know your blood type and the blood type of the baby's father.  Call your local emergency services (911 in the [place] if you are a victim of domestic violence or assault. For help and support, contact  the [employer] Domestic Violence Hotline. WHEN SHOULD I GET HELP RIGHT AWAY?  You fall on your belly or have any high-impact accident or injury.  You have been a victim of domestic violence or any kind of violence.  You have been in a car accident.  You have bleeding from your vagina.  Fluid is leaking from your vagina.  You start to have belly cramping (contractions) or pain.  You feel weak or pass out (faint).  You start to throw up (vomit) after an injury.  You have been burned.  You have a stiff neck or neck pain.  You get a headache or have vision problems after an injury.  You do not feel the baby move or the baby is not moving as much as normal. Document Released: 10/25/2010 Document Revised: 02/06/2014 Document Reviewed: 06/29/2013 [facility] Patient Information 2015 [place]. This information is not intended to replace advice given to you by your health care provider. Make sure you discuss any questions you have with your health care provider.    Subchorionic Hematoma A subchorionic hematoma is a gathering of blood between the outer wall of the placenta and the inner wall of the womb (uterus). The placenta is the organ that connects the fetus to the wall of the uterus. The placenta performs the feeding, breathing (oxygen to the fetus), and waste removal (excretory work) of the fetus.  Subchorionic hematoma is the most common abnormality found on a result from ultrasonography done during the first trimester or early second  trimester of pregnancy. If there has been little or no vaginal bleeding, early small hematomas usually shrink on their own and do not affect your baby or pregnancy. The blood is gradually absorbed over 1-2 weeks. When bleeding starts later in pregnancy or the hematoma is larger or occurs in an older pregnant woman, the outcome may not be as good. Larger hematomas may get bigger, which increases the chances for miscarriage. Subchorionic hematoma also increases the risk of premature detachment of the placenta from the uterus, preterm (premature) labor, and stillbirth. HOME CARE INSTRUCTIONS   Avoid heavy lifting (more than 10 lb [4.5 kg]), exercise, sexual intercourse, or douching as directed by your health care provider.  Do not use tampons.  Keep all follow-up appointments as directed by your health care provider. Your health care provider may ask you to have follow-up blood tests or ultrasound tests or both. SEEK IMMEDIATE MEDICAL CARE IF:  You have severe cramps in your stomach, back, abdomen, or pelvis.  You have a fever.  You pass large clots or tissue. Save any tissue for your health care provider to look at.  Your bleeding increases or you become lightheaded, feel weak, or have fainting episodes. Document Released: 01/07/2007 Document Revised: 02/06/2014 Document Reviewed: 04/21/2013 [facility] Patient Information 2015 [place], [place]. This information is not intended to replace advice given to you by your health care provider. Make sure you discuss any questions you have with your health care provider.

## 2015-02-27 NOTE — ED Notes (Signed)
Pt. Tolerated sandwich and drink. Now resting.

## 2015-02-27 NOTE — ED Notes (Signed)
Pt. Stating she needs to leave due to work at 6 am. Nurse notified.

## 2015-03-06 ENCOUNTER — Emergency Department (HOSPITAL_COMMUNITY): Payer: Medicaid Other

## 2015-03-06 ENCOUNTER — Encounter (HOSPITAL_COMMUNITY): Payer: Self-pay | Admitting: Physical Medicine and Rehabilitation

## 2015-03-06 ENCOUNTER — Emergency Department (HOSPITAL_COMMUNITY)
Admission: EM | Admit: 2015-03-06 | Discharge: 2015-03-06 | Disposition: A | Discharge status: Home / Self Care | Payer: Medicaid Other | Attending: Emergency Medicine | Admitting: Emergency Medicine

## 2015-03-06 DIAGNOSIS — O9989 Other specified diseases and conditions complicating pregnancy, childbirth and the puerperium: Secondary | ICD-10-CM | POA: Diagnosis present

## 2015-03-06 DIAGNOSIS — R42 Dizziness and giddiness: Secondary | ICD-10-CM | POA: Diagnosis not present

## 2015-03-06 DIAGNOSIS — Z3A01 Less than 8 weeks gestation of pregnancy: Secondary | ICD-10-CM | POA: Diagnosis not present

## 2015-03-06 DIAGNOSIS — Z8639 Personal history of other endocrine, nutritional and metabolic disease: Secondary | ICD-10-CM | POA: Diagnosis not present

## 2015-03-06 DIAGNOSIS — O200 Threatened abortion: Secondary | ICD-10-CM | POA: Diagnosis not present

## 2015-03-06 DIAGNOSIS — Z8719 Personal history of other diseases of the digestive system: Secondary | ICD-10-CM | POA: Diagnosis not present

## 2015-03-06 DIAGNOSIS — O209 Hemorrhage in early pregnancy, unspecified: Secondary | ICD-10-CM

## 2015-03-06 LAB — COMPREHENSIVE METABOLIC PANEL
ALT: 26 U/L (ref 14–54)
AST: 24 U/L (ref 15–41)
Albumin: 3.6 g/dL (ref 3.5–5.0)
Alkaline Phosphatase: 68 U/L (ref 38–126)
Anion gap: 8 (ref 5–15)
BUN: 6 mg/dL (ref 6–20)
CALCIUM: 9.7 mg/dL (ref 8.9–10.3)
CO2: 23 mmol/L (ref 22–32)
Chloride: 104 mmol/L (ref 101–111)
Creatinine, Ser: 0.7 mg/dL (ref 0.44–1.00)
Glucose, Bld: 96 mg/dL (ref 65–99)
Potassium: 3.5 mmol/L (ref 3.5–5.1)
Sodium: 135 mmol/L (ref 135–145)
Total Bilirubin: 0.6 mg/dL (ref 0.3–1.2)
Total Protein: 6.8 g/dL (ref 6.5–8.1)

## 2015-03-06 LAB — CBC WITH DIFFERENTIAL/PLATELET
Basophils Absolute: 0.1 10*3/uL (ref 0.0–0.1)
Basophils Relative: 0 % (ref 0–1)
Eosinophils Absolute: 1.4 10*3/uL — ABNORMAL HIGH (ref 0.0–0.7)
Eosinophils Relative: 13 % — ABNORMAL HIGH (ref 0–5)
HEMATOCRIT: 38.8 % (ref 36.0–46.0)
HEMOGLOBIN: 12.7 g/dL (ref 12.0–15.0)
LYMPHS ABS: 1.9 10*3/uL (ref 0.7–4.0)
LYMPHS PCT: 17 % (ref 12–46)
MCH: 28.6 pg (ref 26.0–34.0)
MCHC: 32.7 g/dL (ref 30.0–36.0)
MCV: 87.4 fL (ref 78.0–100.0)
MONO ABS: 0.6 10*3/uL (ref 0.1–1.0)
MONOS PCT: 5 % (ref 3–12)
NEUTROS ABS: 7.3 10*3/uL (ref 1.7–7.7)
Neutrophils Relative %: 65 % (ref 43–77)
Platelets: 271 10*3/uL (ref 150–400)
RBC: 4.44 MIL/uL (ref 3.87–5.11)
RDW: 13.7 % (ref 11.5–15.5)
WBC: 11.2 10*3/uL — AB (ref 4.0–10.5)

## 2015-03-06 LAB — HCG, QUANTITATIVE, PREGNANCY: hCG, Beta Chain, Quant, S: 29647 m[IU]/mL — ABNORMAL HIGH (ref <5)

## 2015-03-06 LAB — ABO/RH: ABO/RH(D): O POS

## 2015-03-06 MED ORDER — ACETAMINOPHEN 325 MG PO TABS
650.0000 mg | ORAL_TABLET | Freq: Once | ORAL | Status: AC
  Administered 2015-03-06: 650 mg via ORAL
  Filled 2015-03-06: qty 2

## 2015-03-06 NOTE — ED Notes (Signed)
Pt presents to department for evaluation of vaginal bleeding and abdominal cramping. 6/10 abdominal pain upon arrival. Pt states she is x7 weeks pregnant. Pt is alert and oriented x4.

## 2015-03-06 NOTE — ED Provider Notes (Signed)
CSN: [id number]     Arrival date & time 03/06/15  1302 History   First MD Initiated Contact with Patient 03/06/15 1624     Chief Complaint  Patient presents with  . Vaginal Bleeding  . Abdominal Cramping     (Consider location/radiation/quality/duration/timing/severity/associated sxs/prior Treatment) Patient is a 29 y.o. female presenting with vaginal bleeding and cramps. The history is provided by the patient.  Vaginal Bleeding Associated symptoms: dizziness   Associated symptoms: no abdominal pain, no back pain and no nausea   Abdominal Cramping Pertinent negatives include no chest pain, no abdominal pain, no headaches and no shortness of breath.   patient presents with vaginal bleeding. She is [redacted] weeks pregnant and had an ultrasound showing intrauterine pregnancy around 1 week ago. States that she began to have vaginal bleeding. States she's also had pelvic cramping does not feel like menses. She states it does feel like contractions. It is somewhat in her pelvis and somewhat in her back. She states she had a little lightheadedness that has improved. She does not know her blood type is. She thinks she is miscarrying. She has had a previous miscarriage but that was at around 5 months. She is G6 P4   Past Medical History  Diagnosis Date  . Acid reflux   . Hypoglycemia    History reviewed. No pertinent past surgical history. No family history on file. History  Substance Use Topics  . Smoking status: Never Smoker   . Smokeless tobacco: Never Used  . Alcohol Use: No   OB History    Gravida Para Term Preterm AB TAB SAB Ectopic Multiple Living   Review of Systems  Constitutional: Negative for activity change and appetite change.  Eyes: Negative for pain.  Respiratory: Negative for chest tightness and shortness of breath.   Cardiovascular: Negative for chest pain and leg swelling.  Gastrointestinal: Negative for nausea, vomiting, abdominal pain and diarrhea.   Genitourinary: Positive for vaginal bleeding. Negative for flank pain.  Musculoskeletal: Negative for back pain and neck stiffness.  Skin: Negative for rash.  Neurological: Positive for dizziness. Negative for weakness, numbness and headaches.  Psychiatric/Behavioral: Negative for behavioral problems.      Allergies  Review of patient's allergies indicates no known allergies.  Home Medications   Prior to Admission medications   Medication Sig Start Date End Date Taking? Authorizing Provider  acetaminophen (TYLENOL) 500 MG tablet Take 1 tablet (500 mg total) by mouth every 6 (six) hours as needed. Patient not taking: Reported on 03/06/2015 02/17/15   Fayrene Helper, PA-C  cefdinir (OMNICEF) 300 MG capsule Take 1 capsule (300 mg total) by mouth 2 (two) times daily. Patient not taking: Reported on 02/26/2015 01/01/15   Charm Rings, MD  naproxen (NAPROSYN) 500 MG tablet Take 1 tablet (500 mg total) by mouth 2 (two) times daily. Patient not taking: Reported on 12/19/2014 11/23/14   Marisa Severin, MD  traMADol (ULTRAM) 50 MG tablet Take 1 tablet (50 mg total) by mouth every 6 (six) hours as needed. Patient not taking: Reported on 02/26/2015 01/01/15   Charm Rings, MD   BP 102/49 mmHg  Pulse 66  Temp(Src) 98.9 F (37.2 C) (Oral)  Resp 16  SpO2 100%  LMP 01/14/2015 Physical Exam  Constitutional: She appears well-developed and well-nourished.  HENT:  Head: Normocephalic.  Eyes: Pupils are equal, round, and reactive to light.  Cardiovascular: Normal rate.   Pulmonary/Chest:  Effort normal.  Abdominal: Soft. There is no tenderness.  Genitourinary:  Patient has some blood in the vagina. May have some active bleeding. Cervix may be somewhat soft os maybe opening but does not appear fully open at this time.  Musculoskeletal: Normal range of motion.  Neurological: She is alert.  Skin: Skin is warm.    ED Course  Procedures (including critical care time) Labs Review Labs Reviewed  CBC WITH  DIFFERENTIAL/PLATELET - Abnormal; Notable for the following:    WBC 11.2 (*)    Eosinophils Relative 13 (*)    Eosinophils Absolute 1.4 (*)    All other components within normal limits  HCG, QUANTITATIVE, PREGNANCY - Abnormal; Notable for the following:    hCG, Beta Chain, [place] [address] (*)    All other components within normal limits  COMPREHENSIVE METABOLIC PANEL  URINALYSIS, ROUTINE W REFLEX MICROSCOPIC (NOT AT [facility])  ABO/RH    Imaging Review US Ob Limited  03/06/2015   CLINICAL DATA:  Vaginal bleeding and cramping  EXAM: OBSTETRIC <14 WK [place] AND TRANSVAGINAL OB US  TECHNIQUE: Both transabdominal and transvaginal ultrasound examinations were performed for complete evaluation of the gestation as well as the maternal uterus, adnexal regions, and pelvic cul-de-sac. Transvaginal technique was performed to assess early pregnancy.  COMPARISON:  02/27/2015  FINDINGS: Intrauterine gestational sac: Visualized/normal in shape. There is again noted a subchorionic hemorrhage.  Yolk sac:  Present  Embryo:  Present  Cardiac Activity: Difficult to obtain but present  Heart Rate: 56  bpm  CRL:  12.7  mm   7 w   3 d                  [place] EDC: 10/20/2015  Maternal uterus/adnexae: Within normal limits aside from the subchorionic hemorrhage.  IMPRESSION: Single live intrauterine gestation as described. There has been adequate size progression when compared with the prior study. A subchorionic hemorrhage is again seen. The cardiac activity and heartbeat appear less than that seen on the prior exam. Clinical followup is recommended.   Electronically Signed   By: Alcide Clever M.D.   On: 03/06/2015 20:10   US Ob Transvaginal  03/06/2015   CLINICAL DATA:  Vaginal bleeding and cramping  EXAM: OBSTETRIC <14 WK [place] AND TRANSVAGINAL OB US  TECHNIQUE: Both transabdominal and transvaginal ultrasound examinations were performed for complete evaluation of the gestation as well as the maternal uterus, adnexal regions, and pelvic  cul-de-sac. Transvaginal technique was performed to assess early pregnancy.  COMPARISON:  02/27/2015  FINDINGS: Intrauterine gestational sac: Visualized/normal in shape. There is again noted a subchorionic hemorrhage.  Yolk sac:  Present  Embryo:  Present  Cardiac Activity: Difficult to obtain but present  Heart Rate: 56  bpm  CRL:  12.7  mm   7 w   3 d                  [place] EDC: 10/20/2015  Maternal uterus/adnexae: Within normal limits aside from the subchorionic hemorrhage.  IMPRESSION: Single live intrauterine gestation as described. There has been adequate size progression when compared with the prior study. A subchorionic hemorrhage is again seen. The cardiac activity and heartbeat appear less than that seen on the prior exam. Clinical followup is recommended.   Electronically Signed   By: Alcide Clever M.D.   On: 03/06/2015 20:10     EKG Interpretation None      MDM   Final diagnoses:  Threatened miscarriage in early pregnancy  Patient with vaginal bleeding and cramping. Likely threatened for an epidural miscarriage. Os did not feel completely open but did feel as if it may be progressing that way. Ultrasound showed slower heart rate compared to prior. [facility] Patient's blood type is Rh+.    Benjiman CoreNathan Caston Co[place]mith, MD 03/06/15 2102

## 2015-03-06 NOTE — Discharge Instructions (Signed)

## 2015-03-06 NOTE — ED Notes (Signed)
Pt advised tech that she is dizzy, reported to triage on admission.  VSS.  Vag bleeding started last night, 4 pads used, 1/2 saturated, changed pad @ 9am, 1/2 saturated, changed pad again @ 11:30am, scant amount on pad now per pt.

## 2015-04-21 ENCOUNTER — Emergency Department (HOSPITAL_COMMUNITY)
Admission: EM | Admit: 2015-04-21 | Discharge: 2015-04-22 | Disposition: A | Discharge status: Other Acute Inpatient Hospital | Payer: Medicaid Other | Attending: Emergency Medicine | Admitting: Emergency Medicine

## 2015-04-21 ENCOUNTER — Emergency Department (HOSPITAL_COMMUNITY): Payer: Medicaid Other

## 2015-04-21 ENCOUNTER — Encounter (HOSPITAL_COMMUNITY): Payer: Self-pay | Admitting: Emergency Medicine

## 2015-04-21 DIAGNOSIS — Z3A01 Less than 8 weeks gestation of pregnancy: Secondary | ICD-10-CM | POA: Diagnosis not present

## 2015-04-21 DIAGNOSIS — F1010 Alcohol abuse, uncomplicated: Secondary | ICD-10-CM | POA: Diagnosis not present

## 2015-04-21 DIAGNOSIS — Z331 Pregnant state, incidental: Secondary | ICD-10-CM | POA: Insufficient documentation

## 2015-04-21 DIAGNOSIS — R45851 Suicidal ideations: Secondary | ICD-10-CM

## 2015-04-21 DIAGNOSIS — F322 Major depressive disorder, single episode, severe without psychotic features: Secondary | ICD-10-CM | POA: Diagnosis present

## 2015-04-21 DIAGNOSIS — Z8639 Personal history of other endocrine, nutritional and metabolic disease: Secondary | ICD-10-CM | POA: Insufficient documentation

## 2015-04-21 DIAGNOSIS — Z8719 Personal history of other diseases of the digestive system: Secondary | ICD-10-CM | POA: Diagnosis not present

## 2015-04-21 DIAGNOSIS — E349 Endocrine disorder, unspecified: Secondary | ICD-10-CM

## 2015-04-21 DIAGNOSIS — Z008 Encounter for other general examination: Secondary | ICD-10-CM | POA: Diagnosis present

## 2015-04-21 DIAGNOSIS — F329 Major depressive disorder, single episode, unspecified: Secondary | ICD-10-CM | POA: Diagnosis not present

## 2015-04-21 DIAGNOSIS — F1210 Cannabis abuse, uncomplicated: Secondary | ICD-10-CM | POA: Insufficient documentation

## 2015-04-21 DIAGNOSIS — F32A Depression, unspecified: Secondary | ICD-10-CM

## 2015-04-21 LAB — CBC
HCT: 38.1 % (ref 36.0–46.0)
Hemoglobin: 12.7 g/dL (ref 12.0–15.0)
MCH: 28.9 pg (ref 26.0–34.0)
MCHC: 33.3 g/dL (ref 30.0–36.0)
MCV: 86.6 fL (ref 78.0–100.0)
PLATELETS: 385 10*3/uL (ref 150–400)
RBC: 4.4 MIL/uL (ref 3.87–5.11)
RDW: 13.9 % (ref 11.5–15.5)
WBC: 12.4 10*3/uL — AB (ref 4.0–10.5)

## 2015-04-21 LAB — COMPREHENSIVE METABOLIC PANEL
ALBUMIN: 3.9 g/dL (ref 3.5–5.0)
ALT: 15 U/L (ref 14–54)
ANION GAP: 8 (ref 5–15)
AST: 15 U/L (ref 15–41)
Alkaline Phosphatase: 62 U/L (ref 38–126)
BILIRUBIN TOTAL: 0.3 mg/dL (ref 0.3–1.2)
BUN: 8 mg/dL (ref 6–20)
CALCIUM: 9.7 mg/dL (ref 8.9–10.3)
CHLORIDE: 108 mmol/L (ref 101–111)
CO2: 23 mmol/L (ref 22–32)
CREATININE: 0.52 mg/dL (ref 0.44–1.00)
GFR calc Af Amer: >60 mL/min (ref >60)
GFR calc non Af Amer: >60 mL/min (ref >60)
Glucose, Bld: 106 mg/dL — ABNORMAL HIGH (ref 65–99)
Potassium: 3.6 mmol/L (ref 3.5–5.1)
Sodium: 139 mmol/L (ref 135–145)
TOTAL PROTEIN: 7.5 g/dL (ref 6.5–8.1)

## 2015-04-21 LAB — RAPID URINE DRUG SCREEN, HOSP PERFORMED
AMPHETAMINES: NOT DETECTED
BARBITURATES: NOT DETECTED
BENZODIAZEPINES: NOT DETECTED
COCAINE: NOT DETECTED
Opiates: NOT DETECTED
TETRAHYDROCANNABINOL: POSITIVE — AB

## 2015-04-21 LAB — I-STAT BETA HCG BLOOD, ED (MC, WL, AP ONLY)

## 2015-04-21 LAB — ACETAMINOPHEN LEVEL: Acetaminophen (Tylenol), Serum: <10 ug/mL — ABNORMAL LOW (ref 10–30)

## 2015-04-21 LAB — SALICYLATE LEVEL

## 2015-04-21 LAB — ETHANOL: ALCOHOL ETHYL (B): 75 mg/dL — AB (ref <5)

## 2015-04-21 MED ORDER — LORAZEPAM 0.5 MG PO TABS: 1.0000 mg | ORAL_TABLET | Freq: Three times a day (TID) | ORAL | Status: DC | PRN

## 2015-04-21 MED ORDER — HYDROXYZINE HCL 25 MG PO TABS
50.0000 mg | ORAL_TABLET | Freq: Once | ORAL | Status: AC
  Administered 2015-04-21: 50 mg via ORAL
  Filled 2015-04-21: qty 2

## 2015-04-21 MED ORDER — ACETAMINOPHEN 500 MG PO TABS
1000.0000 mg | ORAL_TABLET | Freq: Once | ORAL | Status: AC
  Administered 2015-04-21: 1000 mg via ORAL
  Filled 2015-04-21: qty 2

## 2015-04-21 MED ORDER — IBUPROFEN 200 MG PO TABS: 600.0000 mg | ORAL_TABLET | Freq: Three times a day (TID) | ORAL | Status: DC | PRN

## 2015-04-21 MED ORDER — ALUM & MAG HYDROXIDE-SIMETH 200-200-20 MG/5ML PO SUSP: 30.0000 mL | ORAL | Status: DC | PRN

## 2015-04-21 MED ORDER — NICOTINE 21 MG/24HR TD PT24: 21.0000 mg | MEDICATED_PATCH | Freq: Every day | TRANSDERMAL | Status: DC

## 2015-04-21 MED ORDER — ONDANSETRON HCL 4 MG PO TABS: 4.0000 mg | ORAL_TABLET | Freq: Three times a day (TID) | ORAL | Status: DC | PRN

## 2015-04-21 MED ORDER — ACETAMINOPHEN 325 MG PO TABS
650.0000 mg | ORAL_TABLET | ORAL | Status: DC | PRN
  Administered 2015-04-21: 650 mg via ORAL
  Filled 2015-04-21: qty 2

## 2015-04-21 MED ORDER — ZOLPIDEM TARTRATE 5 MG PO TABS: 5.0000 mg | ORAL_TABLET | Freq: Every evening | ORAL | Status: DC | PRN

## 2015-04-21 NOTE — ED Notes (Signed)
US Tech here to see pt.

## 2015-04-21 NOTE — ED Notes (Signed)
Pt. Alert and oriented in no distress denies SI, HI, AVH and pain.  Pt. Instructed to come to me with problems or concerns.Will continue to monitor for safety via security cameras and Q 15 minute checks. 

## 2015-04-21 NOTE — ED Notes (Signed)
Patient's mom taking all of belongings to car.

## 2015-04-21 NOTE — ED Notes (Signed)
TTS into see 

## 2015-04-21 NOTE — BHH Counselor (Addendum)
This Clinical research associatewriter sent out supporting documentation for patient placement to the following facilities:  [facility] are currently at [facility], but are expecting discharges on Monday and Tuesday of next week. [facility]  SalleyLatoya McNeil, [place] OBS Counselor

## 2015-04-21 NOTE — ED Notes (Signed)
Report received from Janie Rambo RN. Pt. Sleeping, respirations regular and unlabored. Will continue to monitor for safety via security cameras and Q 15 minute checks. 
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
## 2015-04-21 NOTE — ED Notes (Signed)
Pt. C/o anxiety, insomnia and back pain.

## 2015-04-21 NOTE — ED Notes (Signed)
Pt. Noted in room. No complaints or concerns voiced. No distress or abnormal behavior noted. Will continue to monitor with security cameras. Q 15 minute rounds continue. 
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
## 2015-04-21 NOTE — ED Notes (Signed)
[facility] contact information [phone].
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
## 2015-04-21 NOTE — ED Notes (Signed)
Pt. Noted in room with US Tech.

## 2015-04-21 NOTE — ED Notes (Signed)
Unable to collect labs at this time MD talking with patient.

## 2015-04-21 NOTE — ED Notes (Signed)
Pt. Noted sleeping in room. No complaints or concerns voiced. No distress or abnormal behavior noted. Will continue to monitor with security cameras. Q 15 minute rounds continue. 

## 2015-04-22 ENCOUNTER — Inpatient Hospital Stay (HOSPITAL_COMMUNITY)
Admission: AD | Admit: 2015-04-22 | Discharge: 2015-04-25 | DRG: 885 | Disposition: A | Discharge status: Home / Self Care | Payer: Medicaid Other | Source: Intra-hospital | Attending: Psychiatry | Admitting: Psychiatry

## 2015-04-22 ENCOUNTER — Encounter (HOSPITAL_COMMUNITY): Payer: Self-pay | Admitting: *Deleted

## 2015-04-22 DIAGNOSIS — F332 Major depressive disorder, recurrent severe without psychotic features: Secondary | ICD-10-CM | POA: Diagnosis present

## 2015-04-22 DIAGNOSIS — F1010 Alcohol abuse, uncomplicated: Secondary | ICD-10-CM | POA: Diagnosis present

## 2015-04-22 DIAGNOSIS — F329 Major depressive disorder, single episode, unspecified: Secondary | ICD-10-CM | POA: Diagnosis not present

## 2015-04-22 DIAGNOSIS — F322 Major depressive disorder, single episode, severe without psychotic features: Secondary | ICD-10-CM | POA: Diagnosis present

## 2015-04-22 DIAGNOSIS — R45851 Suicidal ideations: Secondary | ICD-10-CM | POA: Diagnosis present

## 2015-04-22 MED ORDER — HYDROXYZINE HCL 25 MG PO TABS
25.0000 mg | ORAL_TABLET | Freq: Three times a day (TID) | ORAL | Status: DC | PRN
  Administered 2015-04-22: 25 mg via ORAL
  Filled 2015-04-22: qty 1

## 2015-04-22 MED ORDER — DIPHENHYDRAMINE HCL 25 MG PO CAPS: 25.0000 mg | ORAL_CAPSULE | Freq: Every day | ORAL | Status: DC

## 2015-04-22 MED ORDER — MAGNESIUM HYDROXIDE 400 MG/5ML PO SUSP: 30.0000 mL | Freq: Every day | ORAL | Status: DC | PRN

## 2015-04-22 MED ORDER — ACETAMINOPHEN 325 MG PO TABS
650.0000 mg | ORAL_TABLET | Freq: Four times a day (QID) | ORAL | Status: DC | PRN
  Administered 2015-04-24: 650 mg via ORAL
  Filled 2015-04-22 (×2): qty 2

## 2015-04-22 MED ORDER — IBUPROFEN 600 MG PO TABS
600.0000 mg | ORAL_TABLET | Freq: Four times a day (QID) | ORAL | Status: DC | PRN
  Administered 2015-04-22: 600 mg via ORAL
  Filled 2015-04-22: qty 1

## 2015-04-22 MED ORDER — ALUM & MAG HYDROXIDE-SIMETH 200-200-20 MG/5ML PO SUSP: 30.0000 mL | ORAL | Status: DC | PRN

## 2015-04-22 MED ORDER — SERTRALINE HCL 50 MG PO TABS
25.0000 mg | ORAL_TABLET | Freq: Every day | ORAL | Status: DC
  Administered 2015-04-22: 25 mg via ORAL
  Filled 2015-04-22: qty 1

## 2015-04-22 MED ORDER — TRAZODONE HCL 50 MG PO TABS: 50.0000 mg | ORAL_TABLET | Freq: Every evening | ORAL | Status: DC | PRN

## 2015-04-22 NOTE — ED Notes (Signed)
[facility] here to transport 

## 2015-04-22 NOTE — ED Notes (Signed)
Pt. Noted sleeping in room. No complaints or concerns voiced. No distress or abnormal behavior noted. Will continue to monitor with security cameras. Q 15 minute rounds continue. 
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
## 2015-04-22 NOTE — Progress Notes (Signed)
[facility] Group Notes:  (Nursing/MHT/Case Management/Adjunct)  Date:  04/22/2015  Time:  9:16 PM  Type of Therapy:  Psychoeducational Skills  Participation Level:  Active  Participation Quality:  Appropriate  Affect:  Flat  Cognitive:  Appropriate  Insight:  Appropriate  Engagement in Group:  Engaged  Modes of Intervention:  Education  Summary of Progress/Problems: Patient expressed in group that she slept for much of the day and that she was pleased to have spoken with two of her friends. In terms of the theme for the day, her support system will be made up of her friends.   Hazle CocaGOODMAN, Janelys Glassner S 04/22/2015, 9:16 PM

## 2015-04-22 NOTE — ED Notes (Signed)
CSW into see 

## 2015-04-22 NOTE — ED Notes (Signed)
[facility]  Pt has not belongings. Pt encouraged to contact her [facility].  Therapist took belongings home yesterday.
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
## 2015-04-22 NOTE — ED Notes (Signed)
csw into see.  Pt concerned about taking zoloft w/ pregnancy.  Dr A updated, can take zoloft during pregnancy.  Pt aware

## 2015-04-22 NOTE — ED Notes (Signed)
Up on the phone 

## 2015-04-22 NOTE — ED Notes (Signed)
On the phone 

## 2015-04-22 NOTE — ED Notes (Signed)
Pt in the bathroom to shower and change scrubs

## 2015-04-22 NOTE — Progress Notes (Signed)
12:58pm. RN consulted CSW.  Pt told CSW she was concerned about going to inpatient treatment. She states she wants treatment but is worried she will be in the hospital for too long--over a month. CSW explained that it would be quite unusual for patient to stay inpatient that long--more likely she would stay a few days-two weeks. Pt expressed understanding and relief. Pt expressed concerns about missing certain obligations while inpatient--1) unable to pay gas bill 2) unable to pick up a car she recently purchased and 3) not showing up work. Pt has a close friend and therapist who she believes can help her make arrangements. Of note, pt has therapy and med management through [facility]. CSW provided phone numbers to [employer] and [employer], where she purchased the car. CSW also provided phone number to [employer], where pt works. CSW offered that hospital can write a "sick note" for pt when she discharges. Pt expressed understanding and thanks.  York SpanielAlexandra Tianni Escamilla [facility] MarylandCSWA Clinical Social Worker [facility] phone: [phone]

## 2015-04-22 NOTE — ED Notes (Signed)
[facility] contacted for transport 

## 2015-04-22 NOTE — ED Provider Notes (Signed)
[facility] Dr Mariam DollarEapen.  Tonya BuccoMelanie Hadlee Burback, MD 04/22/15 [phone]

## 2015-04-22 NOTE — ED Notes (Signed)
Up to the bathroom 

## 2015-04-22 NOTE — ED Notes (Signed)
visitor into see

## 2015-04-22 NOTE — Progress Notes (Signed)
4:21pm. [facility] AC, Minerva Areolaric, called CSW to inform that pt has been accepted to bed 505-2. Pt can go over once transfer paperwork complete. Pt is voluntary, can transport via [facility].  Mariann LasterAlexandra Romona Murdy LCSWA Clinical Social Worker [facility] phone: [phone]

## 2015-04-22 NOTE — Tx Team (Signed)
Initial Interdisciplinary Treatment Plan   PATIENT STRESSORS: Marital or family conflict   PATIENT STRENGTHS: Ability for insight Average or above average intelligence Capable of independent living General fund of knowledge   PROBLEM LIST: Problem List/Patient Goals Date to be addressed Date deferred Reason deferred Estimated date of resolution  "I wanna learn my trigger points" 04/22/15     Depression 04/22/15     Suicidal Ideation 04/22/15                                          DISCHARGE CRITERIA:  Ability to meet basic life and health needs Improved stabilization in mood, thinking, and/or behavior Verbal commitment to aftercare and medication compliance  PRELIMINARY DISCHARGE PLAN: Attend aftercare/continuing care group Return to previous living arrangement  PATIENT/FAMIILY INVOLVEMENT: This treatment plan has been presented to and reviewed with the patient, Tonya Yates, and/or family member, .  The patient and family have been given the opportunity to ask questions and make suggestions.  [name], [place] 04/22/2015, 8:54 PM

## 2015-04-23 DIAGNOSIS — R45851 Suicidal ideations: Secondary | ICD-10-CM

## 2015-04-23 MED ORDER — DIPHENHYDRAMINE HCL 25 MG PO CAPS
25.0000 mg | ORAL_CAPSULE | Freq: Four times a day (QID) | ORAL | Status: DC | PRN
  Administered 2015-04-23: 25 mg via ORAL
  Filled 2015-04-23: qty 1

## 2015-04-23 MED ORDER — DIPHENHYDRAMINE HCL 25 MG PO CAPS
25.0000 mg | ORAL_CAPSULE | Freq: Every evening | ORAL | Status: DC | PRN
  Administered 2015-04-23 – 2015-04-24 (×3): 25 mg via ORAL
  Filled 2015-04-23 (×5): qty 1

## 2015-04-23 MED ORDER — COMPLETENATE 29-1 MG PO CHEW
1.0000 | CHEWABLE_TABLET | Freq: Every day | ORAL | Status: DC
  Filled 2015-04-23: qty 1

## 2015-04-23 MED ORDER — DIPHENHYDRAMINE HCL 25 MG PO CAPS
ORAL_CAPSULE | ORAL | Status: AC
  Filled 2015-04-23: qty 1

## 2015-04-23 NOTE — [facility] Suicide Risk Assessment (Signed)
[facility]   Nursing information obtained from:    Demographic factors:    Current Mental Status:    Loss Factors:    Historical Factors:    Risk Reduction Factors:    Total Time spent with patient: 30 minutes Principal Problem: Major depressive disorder, recurrent episode Diagnosis:   Patient Active Problem List   Diagnosis Date Noted  . Depression, major, single episode, severe [F32.2] 04/22/2015  . Suicidal ideations [R45.851] 04/22/2015  . Alcohol abuse [F10.10] 04/22/2015  . Major depressive disorder, recurrent episode [F33.9] 04/22/2015  . Suicidal ideation [R45.851]      Continued Clinical Symptoms:  Alcohol Use Disorder Identification Test Final Score (AUDIT): 2 The "Alcohol Use Disorders Identification Test", Guidelines for Use in Primary Care, Second Edition.  World Science writerHealth Organization [facility](WHO). Score between 0-7:  no or low risk or alcohol related problems. Score between 8-15:  moderate risk of alcohol related problems. Score between 16-19:  high risk of alcohol related problems. Score 20 or above:  warrants further diagnostic evaluation for alcohol dependence and treatment.   CLINICAL FACTORS:   Depression:   Hopelessness   Musculoskeletal: Strength & Muscle Tone: within normal limits Gait & Station: normal Patient leans: N/A  Psychiatric Specialty Exam: Physical Exam  ROS  Blood pressure 124/68, pulse 65, temperature 98.4 F (36.9 C), temperature source Oral, resp. rate 16, height 5\' 1"  (1.549 m), weight 78.472 kg (173 lb), last menstrual period 03/06/2015, SpO2 100 %.Body mass index is 32.7 kg/(m^2).  General Appearance: Casual and Fairly Groomed  Patent attorneyye Contact::  Fair  Speech:  Clear and Coherent and Normal Rate  Volume:  Normal  Mood:  Euthymic  Affect:  Congruent and Full Range  Thought Process:  Goal Directed and Logical  Orientation:  Full (Time, Place, and Person)  Thought Content:  Negative  Suicidal Thoughts:  No   Homicidal Thoughts:  No  Memory:  Immediate;   Fair Recent;   Fair Remote;   Fair  Judgement:  Intact  Insight:  Fair  Psychomotor Activity:  Normal  Concentration:  Fair  Recall:  [employer]  Fund of Knowledge:Fair  Language: Fair  Akathisia:  Negative  Handed:  Right  AIMS (if indicated):     Assets:  Communication Skills Desire for Improvement Housing Physical Health Social Support  Sleep:  Number of Hours: 6  Cognition: WNL  ADL's:  Intact     COGNITIVE FEATURES THAT CONTRIBUTE TO RISK:  Thought constriction (tunnel vision)    SUICIDE RISK:   Mild:  Suicidal ideation of limited frequency, intensity, duration, and specificity.  There are no identifiable plans, no associated intent, mild dysphoria and related symptoms, good self-control (both objective and subjective assessment), few other risk factors, and identifiable protective factors, including available and accessible social support.  PLAN OF CARE: admit for safety/stabilization. 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 Will monitor on unit. Will avoid starting scheduled psychotropic meds, since pt is pregnant.  Medical Decision Making:  Established Problem, Stable/Improving (1), Review of Psycho-Social Stressors (1), Review or order clinical lab tests (1) and Review of Medication Regimen & Side Effects (2)  I certify that inpatient services furnished can reasonably be expected to improve the patient's condition.   Ancil LinseySARANGA, Melicia Esqueda 04/23/2015, 5:58 PM
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
## 2015-04-23 NOTE — Plan of Care (Signed)
Problem: Diagnosis: Increased Risk For Suicide Attempt Goal: LTG-Patient Will Report Improvement in Psychotic Symptoms LTG (by discharge) : Patient will report improvement in psychotic symptoms.  Outcome: Progressing ot denies AVH at this time

## 2015-04-23 NOTE — [facility] Suicide Risk Assessment (Signed)
[facility] Family/Significant Other Suicide Prevention Education  Suicide Prevention Education:   Patient Refusal for Family/Significant Other Suicide Prevention Education: The patient has refused to provide written consent for family/significant other to be provided Family/Significant Other Suicide Prevention Education during admission and/or prior to discharge.  Physician notified.  CSW provided suicide prevention information with patient.    The suicide prevention education provided includes the following:  Suicide risk factors  Suicide prevention and interventions  National Suicide Hotline telephone number  [facility] assessment telephone number  [facility] Emergency Assistance 911  [facility] and/or Residential Mobile Crisis Unit telephone number   Reyes IvanChelsea Horton, [place]CSW 04/23/2015 11:25 AM

## 2015-04-23 NOTE — Progress Notes (Signed)
D: Pt denies SI/HI/AV. Pt is pleasant and cooperative. Pt rates depression at a 0, anxiety at a 0, and Helplessness/hopelessness at a 0.  A: Pt was offered support and encouragement. Pt was given scheduled medications. Pt was encourage to attend groups. Q 15 minute checks were done for safety.  R:Pt attends groups and interacts well with peers and staff. Pt feels that because she is pregnant she should not be taking medications. Pt has no complaints.Pt receptive to treatment and safety maintained on unit.
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
## 2015-04-23 NOTE — Progress Notes (Signed)
D: Pt denies SI/HI/AVH. Pt is pleasant and cooperative. Pt stated she was sleep all day from the vistaril. Pt continues to issues comprehending certain concepts. 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   A: Pt was offered support and encouragement. Pt was given scheduled medications. Pt was encourage to attend groups. Q 15 minute checks were done for safety.    R:Pt attends groups and interacts well with peers and staff. Pt is taking medication. Pt has no complaints at this time .Pt receptive to treatment and safety maintained on unit.
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
## 2015-04-23 NOTE — Progress Notes (Signed)
The focus of this group is to help patients review their daily goal of treatment and discuss progress on daily workbooks. Pt attended the evening group session and responded to all discussion prompts from the Writer. Pt shared that today was a good day on the unit, the highlights of which were having a good conversation with a physician and catching up on her rest. Pt's only additional request from Nursing Staff this evening was to do laundry, which was taken care of following group. Pt mentioned her most immediate goal was to be discharged from the hospital. Pt's affect was appropriate.

## 2015-04-24 DIAGNOSIS — F332 Major depressive disorder, recurrent severe without psychotic features: Principal | ICD-10-CM

## 2015-04-24 MED ORDER — PRENATAL MULTIVITAMIN CH
1.0000 | ORAL_TABLET | Freq: Every day | ORAL | Status: DC
  Administered 2015-04-24 – 2015-04-25 (×2): 1 via ORAL
  Filled 2015-04-24 (×3): qty 1

## 2015-04-24 NOTE — Plan of Care (Signed)
Problem: Diagnosis: Increased Risk For Suicide Attempt Goal: LTG-Patient Will Report Improved Mood and Deny Suicidal LTG (by discharge) Patient will report improved mood and deny suicidal ideation.  Outcome: Progressing Pt denies SI at this time  Goal: LTG-Patient Will Report Improvement in Psychotic Symptoms LTG (by discharge) : Patient will report improvement in psychotic symptoms.  Outcome: Progressing Pt denies SI at this time

## 2015-04-24 NOTE — [facility] Group Notes (Signed)
[facility] Group Notes:  (Nursing/MHT/Case Management/Adjunct)  Date:  04/24/2015  Time:  0900  Type of Therapy:  Nurse Education  Participation Level:  Active  Participation Quality:  Appropriate  Affect:  Appropriate  Cognitive:  Appropriate  Insight:  Appropriate  Engagement in Group:  Supportive  Modes of Intervention:  Support  Summary of Progress/Problems:  Bennye Almrdley, Faylene Allerton Violon 04/24/2015, 4:32 PM

## 2015-04-24 NOTE — Progress Notes (Addendum)
[facility] MD Progress Note  04/24/2015 5:44 PM Tonya Yates  MRN:  [id number] Subjective:   Patient states that she is feeling better, but that she remains anxious and depressed. She worries and ruminates about a good friend of hers who disappeared a few weeks ago, and states police fear foul play. She denies medication side effects. Objective : I have reviewed chart and met with patient. Have reviewed case with CSW. Patient reports ongoing depression and anxiety, but states she is feeling better today, and is starting to focus more on discharge, hoping to be discharged soon.  Of note , patient is pregnant ( first trimester). She states she is somewhat  ambivalent about her pregnancy at this time, and states " I already have four kids , and I am trying to make ends meet". She is reluctant to start any psychiatric medication/antidepressant at this time, due to pregnancy status, and as noted, reports improvement. She has been going to groups, and has been visible on unit.  No disruptive behaviors on unit, and presents calm and pleasant upon approach. Denies significant neuro-vegetative symptoms of depression at this time and denies any SI. Describes anxiety, worry , which she attributes to her missing friend. Principal Problem: Major depressive disorder, recurrent episode, severe with anxious distress Diagnosis:   Patient Active Problem List   Diagnosis Date Noted  . Depression, major, single episode, severe [F32.2] 04/22/2015  . Suicidal ideations [R45.851] 04/22/2015  . Alcohol abuse [F10.10] 04/22/2015  . Major depressive disorder, recurrent episode, severe with anxious distress [F33.2] 04/22/2015  . Suicidal ideation [R45.851]    Total Time spent with patient: 20 minutes   Past Medical History:  Past Medical History  Diagnosis Date  . Acid reflux   . Hypoglycemia    History reviewed. No pertinent past surgical history. Family History: History reviewed. No pertinent family history. Social  History:  History  Alcohol Use  . Yes    Comment: Patient reports she drinks alcohol 1x a week     History  Drug Use  . Yes  . Special: Marijuana    Comment: Patient reports smoking marijuana     History   Social History  . Marital Status: Single    Spouse Name: N/A  . Number of Children: N/A  . Years of Education: N/A   Social History Main Topics  . Smoking status: Never Smoker   . Smokeless tobacco: Never Used  . Alcohol Use: Yes     Comment: Patient reports she drinks alcohol 1x a week  . Drug Use: Yes    Special: Marijuana     Comment: Patient reports smoking marijuana   . Sexual Activity: Not on file   Other Topics Concern  . None   Social History Narrative   Additional History:    Sleep: Good  Appetite:  Good   Assessment:   Musculoskeletal: Strength & Muscle Tone: within normal limits Gait & Station: normal Patient leans: Backward   Psychiatric Specialty Exam: Physical Exam  ROS- denies vomiting, denies fever, denies chills  Blood pressure 105/66, pulse 63, temperature 98.7 F (37.1 C), temperature source Oral, resp. rate 16, height 5' 1"  (1.549 m), weight 173 lb (78.472 kg), last menstrual period 03/06/2015, SpO2 100 %.Body mass index is 32.7 kg/(m^2).  General Appearance: Well Groomed  Engineer, water::  Good  Speech:  Normal Rate  Volume:  Normal  Mood:  Anxious- currently minimizes depression  Affect:  Appropriate and reactive   Thought Process:  Goal Directed and  Linear  Orientation:  Full (Time, Place, and Person)  Thought Content:  no hallucinations, no delusions, does not appear internally preoccupied   Suicidal Thoughts:  No  Homicidal Thoughts:  No  Memory:  recent and remote grossly intact   Judgement:  Other:  improving   Insight:  improving  Psychomotor Activity:  Normal  Concentration:  Good  Recall:  Good  Fund of Knowledge:Good  Language: Good  Akathisia:  Negative  Handed:  Right  AIMS (if indicated):     Assets:  Desire  for Improvement Physical Health Resilience  ADL's: improved   Cognition: WNL  Sleep:  Number of Hours: 6     Current Medications: Current Facility-Administered Medications  Medication Dose Route Frequency Provider Last Rate Last Dose  . acetaminophen (TYLENOL) tablet 650 mg  650 mg Oral Q6H PRN Harriet Butte, NP   650 mg at 04/24/15 1309  . alum & mag hydroxide-simeth (MAALOX/MYLANTA) 200-200-20 MG/5ML suspension 30 mL  30 mL Oral Q4H PRN Harriet Butte, NP      . diphenhydrAMINE (BENADRYL) capsule 25 mg  25 mg Oral QHS,MR X 1 Laverle Hobby, PA-C   25 mg at 04/23/15 2214  . magnesium hydroxide (MILK OF MAGNESIA) suspension 30 mL  30 mL Oral Daily PRN Harriet Butte, NP      . prenatal multivitamin tablet 1 tablet  1 tablet Oral Daily Ursula Alert, MD   1 tablet at 04/24/15 5520    Lab Results: No results found for this or any previous visit (from the past 48 hour(s)).  Physical Findings: AIMS:  , ,  ,  ,    CIWA:    COWS:      Assessment - at this time patient is improved- describes improved mood, denies severe neuro vegetative symptoms of depression, and is denying any  SI. Regarding anxiety, she endorses justified anxiety and worry about a good friend who has been missing for several weeks, but does not currently feel this anxiety is interfering with her daily activities . Due to pregnancy status and improvement of symptoms , at present not interested in standing psychiatric medication /antidepressant.   Treatment Plan Summary: Daily contact with patient to assess and evaluate symptoms and progress in treatment, Medication management, Plan inpatient treatment and treatment as below For pregnancy- continue prenatal vitamins, encourage prenatal care after discharge. Encouraged full abstinence from alcohol and illicit drugs , particularly during pregnancy. For anxiety-  Support, empathy, Benadryl PRNs if needed For sleep- Benadryl 25 mgrs HS PRN Insomnia Consider discharge  soon as she continues to stabilize .  Medical Decision Making:  Established Problem, Stable/Improving (1), Review of Psycho-Social Stressors (1), Review or order clinical lab tests (1) and Review of Medication Regimen & Side Effects (2)     Tonya Yates 04/24/2015, 5:44 PM

## 2015-04-24 NOTE — Progress Notes (Signed)
D: Pt denies SI/HI/AV. Pt is pleasant and cooperative. Pt rates depression at a 2, anxiety at a 1, and Helplessness/hopelessness at a 0.  A: Pt was offered support and encouragement. Pt was given scheduled medications. Pt was encourage to attend groups. Q 15 minute checks were done for safety.  R:Pt attends groups and interacts well with peers and staff. Pt taking medication. Pt has no complaints.Pt receptive to treatment and safety maintained on unit.

## 2015-04-24 NOTE — [facility] Group Notes (Signed)
[facility] LCSW Group Therapy  04/24/2015 1:15 pm  Type of Therapy: Process Group Therapy  Participation Level:  Active  Participation Quality:  Appropriate  Affect:  Flat  Cognitive:  Oriented  Insight:  Improving  Engagement in Group:  Limited  Engagement in Therapy:  Limited  Modes of Intervention:  Activity, Clarification, Education, Problem-solving and Support  Summary of Progress/Problems: Today's group addressed the issue of overcoming obstacles.  Patients were asked to identify their biggest obstacle post d/c that stands in the way of their on-going success, and then problem solve as to how to manage this.  Active participant throughout.  Talked about obstacles of making poor choices in relationships, and having a mentality of "once I screw up a little bit I figure I might as well just keep going down that road, and that always comes back to bite me, especially when I say things I know better than to say."  Mood upbeat, and appears to be taking in information from others in an effort to try new strategies.  Daryel Geraldorth, Ketzaly Cardella B 04/24/2015   3:11 PM

## 2015-04-24 NOTE — Progress Notes (Signed)
The focus of this group is to help patients review their daily goal of treatment and discuss progress on daily workbooks. Pt attended the evening group session and responded to all discussion prompts from the Writer. Pt shared that today was a good day on the unit, the highlight of which was having another good conversation with her physician. Pt reported having no additional needs from Nursing Staff this evening. She again mentioned hoping to discharge from the hospital as soon as possible. As to her future goals, Pt mentioned she hoped to mature across the next year and wished for stability, whatever form that may take. Pt's affect was appropriate.

## 2015-04-24 NOTE — Progress Notes (Signed)
D: Pt denies SI/HI/AVh. Pt is pleasant and cooperative. Pt stated she plans to start focusing on herself and getting her life together. Pt stated she felt like she was not an "emotional wreck"  A: Pt was offered support and encouragement. Pt was given scheduled medications. Pt was encourage to attend groups. Q 15 minute checks were done for safety.    R:Pt attends groups and interacts well with peers and staff. Pt is taking medication. Pt has no complaints at this time .Pt receptive to treatment and safety maintained on unit.

## 2015-04-25 NOTE — Progress Notes (Signed)
  [facility] :  Will you be returning to the same living situation after discharge:  Yes,  home At discharge, do you have transportation home?: Yes,  CST worker Do you have the ability to pay for your medications: Yes,  MCD  Release of information consent forms completed and in the chart;  Patient's signature needed at discharge.  Patient to Follow up at: Follow-up Information    Follow up with [facility].   Why:  Tonya Yates will be here to pick you up at noon the day of d/c.   Contact information:   [address] [place], [place] [address] Phone: [phone] Fax: [phone]      Patient denies SI/HI: Yes,  yes    Safety Planning and Suicide Prevention discussed: Yes,  yes  Have you used any form of tobacco in the last 30 days? (Cigarettes, Smokeless Tobacco, Cigars, and/or Pipes): No  Has patient been referred to the Quitline?: N/A patient is not a smoker  Kiribatiorth, Tonya Yates 04/25/2015, 2:03 PM

## 2015-04-25 NOTE — [facility] Suicide Risk Assessment (Addendum)
[facility] Discharge Suicide Risk Assessment   Demographic Factors:  29 year old, lives with children, who are out of state for the Summer, employed   Total Time spent with patient: 30 minutes  Musculoskeletal: Strength & Muscle Tone: within normal limits Gait & Station: normal Patient leans: N/A  Psychiatric Specialty Exam: Physical Exam  ROS  Blood pressure 119/57, pulse 65, temperature 98.7 F (37.1 C), temperature source Oral, resp. rate 16, height  (1.549 m), weight 173 lb (78.472 kg), last menstrual period 03/06/2015, SpO2 100 %.Body mass index is 32.7 kg/(m^2).  General Appearance: Well Groomed  Eye Contact::  Good  Speech:  Normal Rate409  Volume:  Normal  Mood:  denies depression, mood described as "OK"  Affect:  more reactive, fuller in range   Thought Process:  Linear  Orientation:  Full (Time, Place, and Person)  Thought Content:  denies hallucinations, no delusions  Suicidal Thoughts:  No  Homicidal Thoughts:  No  Memory:  recent and remote grossly intact   Judgement:  Other:  improved   Insight:  Present  Psychomotor Activity:  Normal  Concentration:  Good  Recall:  Good  Fund of Knowledge:Good  Language: Good  Akathisia:  Negative  Handed:  Right  AIMS (if indicated):     Assets:  Communication Skills Desire for Improvement Physical Health Resilience  Sleep:  Number of Hours: 6  Cognition: WNL  ADL's:  Improved    Have you used any form of tobacco in the last 30 days? (Cigarettes, Smokeless Tobacco, Cigars, and/or Pipes): No  Has this patient used any form of tobacco in the last 30 days? (Cigarettes, Smokeless Tobacco, Cigars, and/or Pipes) No  Mental Status Per Nursing Assessment::   On Admission:     Current Mental Status by Physician: At this time patient is much improved compared to admission- mood is better and now euthymic, affect is bright, no thought disorder, no hallucinations, no delusions, no SI , no HI, future oriented .  Loss  Factors: Her good friend disappeared one month ago and police suspect foul play. Argument with boyfriend.  Historical Factors: No prior psychiatric admissions, no history of suicide attempts, some depression in the past   Risk Reduction Factors:   Responsible for children under 50 years of age, Sense of responsibility to family, Employed, Positive social support and Positive coping skills or problem solving skills  Continued Clinical Symptoms:  As noted, much improved   Cognitive Features That Contribute To Risk:  No gross cognitive deficits noted upon discharge. Is alert , attentive, and oriented x 3   Suicide Risk:  Mild:  Suicidal ideation of limited frequency, intensity, duration, and specificity.  There are no identifiable plans, no associated intent, mild dysphoria and related symptoms, good self-control (both objective and subjective assessment), few other risk factors, and identifiable protective factors, including available and accessible social support.  Principal Problem: Major depressive disorder, recurrent episode, severe with anxious distress Discharge Diagnoses:  Patient Active Problem List   Diagnosis Date Noted  . Depression, major, single episode, severe [F32.2] 04/22/2015  . Suicidal ideations [R45.851] 04/22/2015  . Alcohol abuse [F10.10] 04/22/2015  . Major depressive disorder, recurrent episode, severe with anxious distress [F33.2] 04/22/2015  . Suicidal ideation [R45.851]     Follow-up Information    Follow up with [facility].   Why:  [name] will be here to pick you up at noon the day of d/c.   Contact information:   [address],  [address] [place], [place] [address] Phone: [phone] Fax: [phone]      Plan Of Care/Follow-up recommendations:  Activity:  as tolerated Diet:  regular Tests:  NA Other:  See below  Is patient on multiple antipsychotic therapies at discharge:  No   Has Patient had three or more failed trials of  antipsychotic monotherapy by history:  No  Recommended Plan for Multiple Antipsychotic Therapies: NA   Patient is leaving in good spirits . Plans to return home. Plans to follow up as above.  Patient is currently on first trimester of pregnancy- [facility]/  prenatal care . Encouraged to take prenatal vitamins.  Not currently on any psychiatric medications.    Tonya Yates 04/25/2015, 10:57 AM

## 2015-04-25 NOTE — Progress Notes (Signed)
D- Patient verbalizes readiness for discharge: Denies SI/HI, is not psychotic or delusional. A- Discharge instructions read and discussed with patient.  All belongings returned to patient. R- Patient cooperative during discharge process.  Patient verbalizes understanding of discharge instructions.  Signed for return of belongings. Escorted to the lobby.  Patient left with her therapist, Mrs. Katrinka BlazingSmith.

## 2015-04-25 NOTE — Discharge Summary (Signed)
Physician Discharge Summary Note  Patient:  Tonya Yates is an 29 y.o., female MRN:  [id number] DOB:  March 13, 1986 Patient phone:  [phone] (home)  Patient address:   [address] [place] [address],  Total Time spent with patient: 45 minutes  Date of Admission:  04/22/2015 Date of Discharge: 04/25/2015  Reason for Admission:  Suicidal ideations  Principal Problem: Major depressive disorder, recurrent episode, severe with anxious distress Discharge Diagnoses: Patient Active Problem List   Diagnosis Date Noted  . Depression, major, single episode, severe [F32.2] 04/22/2015  . Suicidal ideations [R45.851] 04/22/2015  . Alcohol abuse [F10.10] 04/22/2015  . Major depressive disorder, recurrent episode, severe with anxious distress [F33.2] 04/22/2015  . Suicidal ideation [R45.851]     Musculoskeletal: Strength & Muscle Tone: within normal limits Gait & Station: normal Patient leans: N/A  Psychiatric Specialty Exam:  SEE SRA Physical Exam  Vitals reviewed.   Review of Systems  All other systems reviewed and are negative.   Blood pressure 119/57, pulse 65, temperature 98.7 F (37.1 C), temperature source Oral, resp. rate 16, height 5\' 1"  (1.549 m), weight 78.472 kg (173 lb), last menstrual period 03/06/2015, SpO2 100 %.Body mass index is 32.7 kg/(m^2).  Have you used any form of tobacco in the last 30 days? (Cigarettes, Smokeless Tobacco, Cigars, and/or Pipes): No  Has this patient used any form of tobacco in the last 30 days? (Cigarettes, Smokeless Tobacco, Cigars, and/or Pipes) N/A  Past Medical History:  Past Medical History  Diagnosis Date  . Acid reflux   . Hypoglycemia    History reviewed. No pertinent past surgical history. Family History: History reviewed. No pertinent family history. Social History:  History  Alcohol Use  . Yes    Comment: Patient reports she drinks alcohol 1x a week     History  Drug Use  . Yes  . Special: Marijuana    Comment:  Patient reports smoking marijuana     History   Social History  . Marital Status: Single    Spouse Name: N/A  . Number of Children: N/A  . Years of Education: N/A   Social History Main Topics  . Smoking status: Never Smoker   . Smokeless tobacco: Never Used  . Alcohol Use: Yes     Comment: Patient reports she drinks alcohol 1x a week  . Drug Use: Yes    Special: Marijuana     Comment: Patient reports smoking marijuana   . Sexual Activity: Not on file   Other Topics Concern  . None   Social History Narrative   Risk to Self: Is patient at risk for suicide?: Yes What has been your use of drugs/alcohol within the last 12 months?: Pt denies Risk to Others:   Prior Inpatient Therapy:   Prior Outpatient Therapy:    Level of Care:  OP  Hospital Course:   Tonya Yates is an 29 y.o. female who presents voluntarily to [facility] ED with the presenting problem of suicidal ideations with plan to overdose on pills. The patient reports numerous stressors that have resulted in worsening of her depression. She endorsed multiple depressive symptoms to include fatigue, worthlessness, social isolation, loss of interest, and suicidal thoughts.   Tonya Yates was admitted for Major depressive disorder, recurrent episode, severe with anxious distress and crisis management.  She was treated discharged with the medications listed below under Medication List.  Medical problems were identified and treated as needed.  Home medications were restarted as appropriate.  Improvement was  monitored by observation and Tonya Yates daily report of symptom reduction.  Emotional and mental status was monitored by daily self-inventory reports completed by Tonya Yates and clinical staff.         Tonya Yates was evaluated by the treatment team for stability and plans for continued recovery upon discharge.  Tonya Yates motivation was an integral factor for scheduling further treatment.  Employment, transportation,  bed availability, health status, family support, and any pending legal issues were also considered during her hospital stay.  She was offered further treatment options upon discharge including but not limited to Residential, Intensive Outpatient, and Outpatient treatment.  Tonya Yates will follow up with the services as listed below under Follow Up Information.     Upon completion of this admission the patient was both mentally and medically stable for discharge denying suicidal/homicidal ideation, auditory/visual/tactile hallucinations, delusional thoughts and paranoia.       Consults:  psychiatry  Significant Diagnostic Studies:  labs: per ED  Discharge Vitals:   Blood pressure 119/57, pulse 65, temperature 98.7 F (37.1 C), temperature source Oral, resp. rate 16, height 5\' 1"  (1.549 m), weight 78.472 kg (173 lb), last menstrual period 03/06/2015, SpO2 100 %. Body mass index is 32.7 kg/(m^2). Lab Results:   No results found for this or any previous visit (from the past 72 hour(s)).  Physical Findings: AIMS: Facial and Oral Movements Muscles of Facial Expression: None, normal Lips and Perioral Area: None, normal Jaw: None, normal Tongue: None, normal,Extremity Movements Upper (arms, wrists, hands, fingers): None, normal Lower (legs, knees, ankles, toes): None, normal, Trunk Movements Neck, shoulders, hips: None, normal, Overall Severity Severity of abnormal movements (highest score from questions above): None, normal Incapacitation due to abnormal movements: None, normal Patient's awareness of abnormal movements (rate only patient's report): No Awareness, Dental Status Current problems with teeth and/or dentures?: No Does patient usually wear dentures?: No  CIWA:    COWS:      See Psychiatric Specialty Exam and Suicide Risk Assessment completed by Attending Physician prior to discharge.  Discharge destination:  Home  Is patient on multiple antipsychotic therapies at discharge:   No   Has Patient had three or more failed trials of antipsychotic monotherapy by history:  No    Recommended Plan for Multiple Antipsychotic Therapies: NA     Medication List    STOP taking these medications        acetaminophen 500 MG tablet  Commonly known as:  TYLENOL     cefdinir 300 MG capsule  Commonly known as:  OMNICEF     naproxen 500 MG tablet  Commonly known as:  NAPROSYN     traMADol 50 MG tablet  Commonly known as:  ULTRAM       Follow-up Information    Follow up with [facility].   Why:  Ms Katrinka Blazing will be here to pick you up at noon the day of d/c.   Contact information:   [address] [place], [place] [address] Phone: [phone] Fax: [phone]      Follow-up recommendations:  Activity:  as tol, diet as tol  Comments:  1.  Take all your medications as prescribed.              2.  Report any adverse side effects to outpatient provider.                       3.  Patient instructed to not use  alcohol or illegal drugs while on prescription medicines.            4.  In the event of worsening symptoms, instructed patient to call 911, the crisis hotline or go to nearest emergency room for evaluation of symptoms.  Total Discharge Time:  40 min  Signed: Velna Hatchet May Agustin AGNP-BC 04/25/2015, 1:36 PM   Patient seen, Suicide Assessment Completed.  Disposition Plan Reviewed

## 2015-06-19 ENCOUNTER — Encounter (HOSPITAL_COMMUNITY): Payer: Self-pay | Admitting: Emergency Medicine

## 2015-06-19 ENCOUNTER — Emergency Department (INDEPENDENT_AMBULATORY_CARE_PROVIDER_SITE_OTHER)
Admission: EM | Admit: 2015-06-19 | Discharge: 2015-06-19 | Disposition: A | Discharge status: Home / Self Care | Payer: Medicaid Other | Source: Home / Self Care | Attending: Family Medicine | Admitting: Family Medicine

## 2015-06-19 DIAGNOSIS — R05 Cough: Secondary | ICD-10-CM | POA: Diagnosis not present

## 2015-06-19 DIAGNOSIS — E559 Vitamin D deficiency, unspecified: Secondary | ICD-10-CM

## 2015-06-19 DIAGNOSIS — J302 Other seasonal allergic rhinitis: Secondary | ICD-10-CM | POA: Diagnosis not present

## 2015-06-19 DIAGNOSIS — R059 Cough, unspecified: Secondary | ICD-10-CM

## 2015-06-19 MED ORDER — AZITHROMYCIN 250 MG PO TABS: 250.0000 mg | ORAL_TABLET | Freq: Every day | ORAL | Status: DC

## 2015-06-19 MED ORDER — VITAMIN D (ERGOCALCIFEROL) 1.25 MG (50000 UNIT) PO CAPS: 50000.0000 [IU] | ORAL_CAPSULE | ORAL | Status: DC

## 2015-06-19 MED ORDER — CETIRIZINE-PSEUDOEPHEDRINE ER 5-120 MG PO TB12: 1.0000 | ORAL_TABLET | Freq: Every day | ORAL | Status: DC

## 2015-06-19 NOTE — ED Provider Notes (Signed)
CSN: [id number]     Arrival date & time 06/19/15  1623 History   First MD Initiated Contact with Patient 06/19/15 1735     Chief Complaint  Patient presents with  . Cough   (Consider location/radiation/quality/duration/timing/severity/associated sxs/prior Treatment) Patient is a 29 y.o. female presenting with cough. The history is provided by the patient.  Cough Cough characteristics:  Dry Severity:  Mild Onset quality:  Sudden Duration:  2 days Timing:  Constant Progression:  Unchanged Chronicity:  New  is a 29 year old woman who comes in with her six-year-old daughter. She is complaining about some onset of cough beginning 2 days ago. She has no history of asthma; she's had no fever. The cough is nonproductive. She has no shortness of breath.  Patient's also asking for prescription for vitamin D.  Past Medical History  Diagnosis Date  . Acid reflux   . Hypoglycemia    History reviewed. No pertinent past surgical history. History reviewed. No pertinent family history. Social History  Substance Use Topics  . Smoking status: Never Smoker   . Smokeless tobacco: Never Used  . Alcohol Use: Yes     Comment: Patient reports she drinks alcohol 1x a week   OB History    Gravida Para Term Preterm AB TAB SAB Ectopic Multiple Living   Review of Systems  Constitutional: Negative.   HENT: Negative.   Eyes: Positive for itching.  Respiratory: Positive for cough.   Cardiovascular: Negative.   Gastrointestinal: Negative.   Genitourinary: Negative.   Neurological: Negative.     Allergies  Review of patient's allergies indicates no known allergies.  Home Medications   Prior to Admission medications   Not on File   Meds Ordered and Administered this Visit  Medications - No data to display  BP 112/72 mmHg  Pulse 56  Temp(Src) 98.5 F (36.9 C) (Oral)  Resp 16  SpO2 98%  LMP 06/12/2015 No data found.   Physical Exam  Constitutional: She is oriented  to person, place, and time. She appears well-developed and well-nourished.  HENT:  Head: Normocephalic and atraumatic.  Right Ear: External ear normal.  Left Ear: External ear normal.  Throat is red with minimal swelling of the posterior pharyngeal tissues.  Neck: Normal range of motion. Neck supple. No thyromegaly present.  Cardiovascular: Normal rate, regular rhythm and normal heart sounds.   Pulmonary/Chest: Effort normal and breath sounds normal.  Abdominal: Soft.  Musculoskeletal: She exhibits tenderness.  Some tenderness in the upper back.  Lymphadenopathy:    She has no cervical adenopathy.  Neurological: She is alert and oriented to person, place, and time.  Skin: Skin is warm.  Psychiatric: She has a normal mood and affect. Her behavior is normal. Judgment and thought content normal.  Nursing note and vitals reviewed.   ED Course  Procedures (including critical care time)  Labs Review Labs Reviewed - No data to display  Imaging Review No results found.   Visual Acuity Review       MDM  Assessment: Patient has mild URI symptoms with recurrent strep history. She also has a history of vitamin D deficiency.  Plan: Patient referred back to her primary care doctor for follow-up care  Z-Pak, Zyrtec-D, and vitamin D prescription provided  Signed, Elvina Sidle M.D.    Elvina Sidle, MD 06/19/15 [phone]

## 2015-06-19 NOTE — ED Notes (Signed)
C/o cough States she has productive cough with clear mucous  States she cough more at night States at nighttime she has sob due to coughing No tx tried

## 2015-06-19 NOTE — Discharge Instructions (Signed)
Follow-up with your doctor if your symptoms persist. For the fibromyalgia, try going to the [employer] [facility] next to the [facility] on [address]

## 2015-08-15 ENCOUNTER — Emergency Department (INDEPENDENT_AMBULATORY_CARE_PROVIDER_SITE_OTHER)
Admission: EM | Admit: 2015-08-15 | Discharge: 2015-08-15 | Disposition: A | Discharge status: Home / Self Care | Payer: Medicaid Other | Source: Home / Self Care | Attending: Family Medicine | Admitting: Family Medicine

## 2015-08-15 ENCOUNTER — Encounter (HOSPITAL_COMMUNITY): Payer: Self-pay | Admitting: Emergency Medicine

## 2015-08-15 DIAGNOSIS — J069 Acute upper respiratory infection, unspecified: Secondary | ICD-10-CM | POA: Diagnosis not present

## 2015-08-15 LAB — POCT RAPID STREP A: STREPTOCOCCUS, GROUP A SCREEN (DIRECT): NEGATIVE

## 2015-08-15 MED ORDER — GUAIFENESIN ER 600 MG PO TB12
1200.0000 mg | ORAL_TABLET | Freq: Two times a day (BID) | ORAL | Status: DC
Start: 2015-08-15 — End: 2015-09-23

## 2015-08-15 NOTE — ED Notes (Signed)
Pt has been suffering from URI symptoms for about a week.  She states she has been nauseous for several days and has had a sore throat as well.  She denies any fever, but also complains of GERD.

## 2015-08-15 NOTE — ED Provider Notes (Addendum)
CSN: [id number]     Arrival date & time 08/15/15  1417 History   None    Chief Complaint  Patient presents with  . Sore Throat  . Nausea  . Cough  . Nasal Congestion  . Gastroesophageal Reflux   (Consider location/radiation/quality/duration/timing/severity/associated sxs/prior Treatment) Patient is a 29 y.o. female presenting with pharyngitis, cough, and GERD. The history is provided by the patient. No language interpreter was used.  Sore Throat  Cough Associated symptoms: no fever and no wheezing   Gastroesophageal Reflux  Patient complains of respiratory sxs, starting with sore throat with "white spots" 1 week ago, at the same time as her 18 year old daughter had similar sxs and was diagnosed with strep throat.  Patient says she had a fever initially but none recently. She started with cough and sneezing 4 days ago, green nasal discharge that turned clear recently. Smokes cigarettes. Admits to drinking 1 beer a day in the past month, since her best friend had been found murdered on Oct 16th in [place].  She denies SI, says she had experienced this and was recently in [facility] for this.   Some GERD symptoms, not sure what to do for this. At present no fevers or shortness of breath; does have some cough and congestion.   Past Medical History  Diagnosis Date  . Acid reflux   . Hypoglycemia    History reviewed. No pertinent past surgical history. History reviewed. No pertinent family history. Social History  Substance Use Topics  . Smoking status: Current Every Day Smoker  . Smokeless tobacco: Never Used  . Alcohol Use: Yes     Comment: Patient reports she drinks alcohol 1x a week   OB History    Gravida Para Term Preterm AB TAB SAB Ectopic Multiple Living   Review of Systems  Constitutional: Negative for fever.  Respiratory: Positive for cough. Negative for wheezing.     Allergies  Review of patient's allergies indicates no known  allergies.  Home Medications   Prior to Admission medications   Medication Sig Start Date End Date Taking? Authorizing Provider  ALPRAZolam Prudy Feeler) 0.5 MG tablet Take 0.5 mg by mouth at bedtime as needed for anxiety.   Yes Historical Provider, MD  azithromycin (ZITHROMAX) 250 MG tablet Take 1 tablet (250 mg total) by mouth daily. Take first 2 tablets together, then 1 every day until finished. 06/19/15  Yes Elvina Sidle, MD  cetirizine-pseudoephedrine (ZYRTEC-D) 5-120 MG per tablet Take 1 tablet by mouth daily. 06/19/15   Elvina Sidle, MD  Vitamin D, Ergocalciferol, (DRISDOL) 50000 UNITS CAPS capsule Take 1 capsule (50,000 Units total) by mouth every 7 (seven) days. 06/19/15   Elvina Sidle, MD   Meds Ordered and Administered this Visit  Medications - No data to display  BP 114/73 mmHg  Pulse 75  Temp(Src) 98.7 F (37.1 C) (Oral)  Resp 16  SpO2 97% No data found.   Physical Exam  Constitutional: She appears well-developed and well-nourished. No distress.  Well appearing, no distress  HENT:  Head: Normocephalic and atraumatic.  Right Ear: External ear normal.  Left Ear: External ear normal.  Nose: Nose normal.  Mouth/Throat: Oropharynx is clear and moist. No oropharyngeal exudate.  Eyes: Pupils are equal, round, and reactive to light. Right eye exhibits no discharge. Left eye exhibits no discharge. No scleral icterus.  Neck: Normal range of motion. Neck supple. No thyromegaly present.  Trace shotty bilateral anterior cervical adenopathy noted.   Cardiovascular: Normal rate, regular rhythm and normal heart sounds.  Exam reveals no friction rub.   No murmur heard. Pulmonary/Chest: Effort normal and breath sounds normal. No respiratory distress. She has no wheezes. She has no rales. She exhibits no tenderness.  Abdominal: Soft.  Skin: She is not diaphoretic.    ED Course  Procedures (including critical care time)  Labs Review Labs Reviewed  POCT RAPID STREP A     Imaging Review No results found.   Visual Acuity Review  Right Eye Distance:   Left Eye Distance:   Bilateral Distance:    Right Eye Near:   Left Eye Near:    Bilateral Near:         MDM   1. Upper respiratory infection    Patient with apparent viral URI symptoms; no evidence to support GAS, negative rapid strep in office today. Supportive care, her worst symptom is the congestion and cough> Guaifenesin 1200mg  twice daily by mouth, increased hydration. Discussed reasons that should prompt [facility] to [facility] or her primary doctor at [facility] on [address].     Barbaraann BarthelJames O Grayland Daisey, MD 08/15/15 1710  Barbaraann BarthelJames O Florabel Faulks, MD 08/15/15 [phone]

## 2015-08-15 NOTE — Discharge Instructions (Signed)
It was a pleasure to see you today. I believe you have a viral upper respiratory infection. This should improve in the coming 2 to 4 days.   I recommend taking GUAIFENESIN 600mg  tablets, take 2 tablets (1200mg ) by mouth twice daily, to thin the mucus and make it easier to bring up phlegm.   Your rapid strep test is negative today. I do not believe you have strep throat or will benefit from an antibiotic at this time.  Please return if you begin to experience fevers, shortness of breath, are worsening or with other concerns. Follow up with your doctors at [facility] on [address] for non-urgent issues like your vitamin D supplement and your reflux symptoms.

## 2015-08-18 LAB — CULTURE, GROUP A STREP

## 2015-08-21 NOTE — ED Notes (Signed)
Final report of strep swab positive for strep B, untreated. Discussed w Dr Piedad ClimesHonig, who authorized Amoxicillin 500 mg, PO, BID x 1 week. Called patient, and left VM on patient cell phone to call us. Called Rx to [facility] [address] of [address] as listed in chart as preferred pharmacy. Spoke directly w pharmacy staff. Called patient again at 1951, and left another message on patient cell phone for patient to call us. Home number has message that the subscriber is not available/.

## 2015-09-04 ENCOUNTER — Emergency Department (HOSPITAL_COMMUNITY)
Admission: EM | Admit: 2015-09-04 | Discharge: 2015-09-04 | Disposition: A | Discharge status: Home / Self Care | Payer: Medicaid Other | Source: Home / Self Care | Attending: Emergency Medicine | Admitting: Emergency Medicine

## 2015-09-04 ENCOUNTER — Encounter (HOSPITAL_COMMUNITY): Payer: Self-pay | Admitting: Emergency Medicine

## 2015-09-04 DIAGNOSIS — R12 Heartburn: Secondary | ICD-10-CM

## 2015-09-04 DIAGNOSIS — K5901 Slow transit constipation: Secondary | ICD-10-CM

## 2015-09-04 DIAGNOSIS — R110 Nausea: Secondary | ICD-10-CM | POA: Diagnosis not present

## 2015-09-04 DIAGNOSIS — B349 Viral infection, unspecified: Secondary | ICD-10-CM | POA: Diagnosis not present

## 2015-09-04 DIAGNOSIS — K219 Gastro-esophageal reflux disease without esophagitis: Secondary | ICD-10-CM

## 2015-09-04 LAB — POCT PREGNANCY, URINE: PREG TEST UR: NEGATIVE

## 2015-09-04 LAB — POCT URINALYSIS DIP (DEVICE)
Bilirubin Urine: NEGATIVE
Glucose, UA: NEGATIVE mg/dL
Ketones, ur: NEGATIVE mg/dL
Leukocytes, UA: NEGATIVE
NITRITE: NEGATIVE
PH: 6 (ref 5.0–8.0)
PROTEIN: NEGATIVE mg/dL
Specific Gravity, Urine: >=1.03 (ref 1.005–1.030)
Urobilinogen, UA: 0.2 mg/dL (ref 0.0–1.0)

## 2015-09-04 MED ORDER — OMEPRAZOLE 20 MG PO CPDR: 20.0000 mg | DELAYED_RELEASE_CAPSULE | Freq: Every day | ORAL | Status: DC

## 2015-09-04 MED ORDER — ONDANSETRON HCL 4 MG PO TABS: 4.0000 mg | ORAL_TABLET | Freq: Four times a day (QID) | ORAL | Status: DC

## 2015-09-04 NOTE — ED Notes (Signed)
C/o constant abd pain onset 11/25 associated w/flatulence and has noticed pain increases at night Also has noticed strong urine foul odor Denies fevers, chills A&O x4... No acute distress.

## 2015-09-04 NOTE — ED Provider Notes (Signed)
CSN: [id number]     Arrival date & time 09/04/15  1616 History   First MD Initiated Contact with Patient 09/04/15 1702     Chief Complaint  Patient presents with  . Abdominal Pain  . Urinary Tract Infection   (Consider location/radiation/quality/duration/timing/severity/associated sxs/prior Treatment) HPI Comments: 29 year old female complaining of abdominal pain for 3 days. It comes and goes. She is also having nausea and vomiting. Vomiting is more of a reflux and spitting up according to the patient. It is often worse with eating. When asked to place her hand only area of pain she places her hand or the epigastrium and the entire upper half of the abdomen. Patient is also complaining of urinary urgency with a bad odor. No dysuria or urinary frequency. Denies vaginal discharge. She states she feels bad in general.   Past Medical History  Diagnosis Date  . Acid reflux   . Hypoglycemia    History reviewed. No pertinent past surgical history. No family history on file. Social History  Substance Use Topics  . Smoking status: Current Every Day Smoker  . Smokeless tobacco: Never Used  . Alcohol Use: Yes     Comment: Patient reports she drinks alcohol 1x a week   OB History    Gravida Para Term Preterm AB TAB SAB Ectopic Multiple Living   Review of Systems  Constitutional: Positive for activity change and fatigue. Negative for fever and appetite change.  HENT: Negative.   Respiratory: Negative for cough and shortness of breath.   Cardiovascular: Negative for chest pain.  Gastrointestinal: Positive for nausea, vomiting and abdominal pain.       Heartburn and regurgitation.  Genitourinary: Negative for dysuria, frequency, flank pain, vaginal bleeding, vaginal discharge, menstrual problem and pelvic pain.  Skin: Negative for rash.  Neurological: Negative.     Allergies  Review of patient's allergies indicates no known allergies.  Home Medications   Prior to  Admission medications   Medication Sig Start Date End Date Taking? Authorizing Provider  ALPRAZolam Prudy Feeler) 0.5 MG tablet Take 0.5 mg by mouth at bedtime as needed for anxiety.    Historical Provider, MD  cetirizine-pseudoephedrine (ZYRTEC-D) 5-120 MG per tablet Take 1 tablet by mouth daily. 06/19/15   Elvina Sidle, MD  guaiFENesin (MUCINEX) 600 MG 12 hr tablet Take 2 tablets (1,200 mg total) by mouth 2 (two) times daily. 08/15/15   Barbaraann Barthel, MD  omeprazole (PRILOSEC) 20 MG capsule Take 1 capsule (20 mg total) by mouth daily. 09/04/15   Hayden Rasmussen, NP  ondansetron (ZOFRAN) 4 MG tablet Take 1 tablet (4 mg total) by mouth every 6 (six) hours. Prn nausea or vomiting 09/04/15   Hayden Rasmussen, NP  Vitamin D, Ergocalciferol, (DRISDOL) 50000 UNITS CAPS capsule Take 1 capsule (50,000 Units total) by mouth every 7 (seven) days. 06/19/15   Elvina Sidle, MD   Meds Ordered and Administered this Visit  Medications - No data to display  BP 110/65 mmHg  Pulse 62  Temp(Src) 98.5 F (36.9 C) (Oral)  Resp 18  SpO2 97%  LMP 08/12/2015 No data found.   Physical Exam  Constitutional: She appears well-developed and well-nourished. No distress.  HENT:  Mouth/Throat: Oropharynx is clear and moist. No oropharyngeal exudate.  Neck: Normal range of motion. Neck supple.  Cardiovascular: Normal rate, regular rhythm and normal heart sounds.   Pulmonary/Chest: Effort normal and breath sounds normal. No respiratory distress. She  has no wheezes. She has no rales.  Abdominal: Soft.  Normal bowel sounds. Upper 3/4 of the abdomen percusses is tympanic. Mild tenderness in the epigastrium and left upper quadrant.  Neurological: She is alert. She exhibits normal muscle tone. Coordination normal.  Skin: Skin is warm and dry.  Psychiatric: She has a normal mood and affect.  Nursing note and vitals reviewed.   ED Course  Procedures (including critical care time)  Labs Review Labs Reviewed  POCT URINALYSIS DIP  (DEVICE) - Abnormal; Notable for the following:    Hgb urine dipstick MODERATE (*)    All other components within normal limits  POCT PREGNANCY, URINE   Results for orders placed or performed during the hospital encounter of 09/04/15  POCT urinalysis dip (device)  Result Value Ref Range   Glucose, UA NEGATIVE NEGATIVE mg/dL   Bilirubin Urine NEGATIVE NEGATIVE   Ketones, ur NEGATIVE NEGATIVE mg/dL   Specific Gravity, Urine >=1.030 1.005 - 1.030   Hgb urine dipstick MODERATE (A) NEGATIVE   pH 6.0 5.0 - 8.0   Protein, ur NEGATIVE NEGATIVE mg/dL   Urobilinogen, UA 0.2 0.0 - 1.0 mg/dL   Nitrite NEGATIVE NEGATIVE   Leukocytes, UA NEGATIVE NEGATIVE  Pregnancy, urine POC  Result Value Ref Range   Preg Test, Ur NEGATIVE NEGATIVE       Imaging Review No results found.   Visual Acuity Review  Right Eye Distance:   Left Eye Distance:   Bilateral Distance:    Right Eye Near:   Left Eye Near:    Bilateral Near:         MDM   1. Viral illness   2. Gastric reflux   3. Heartburn   4. Nausea   5. Slow transit constipation   urine culture pending. No U/A nit or leuk.  Clear liquids Omeprazole zofran Miralax for constipation. Wait til your stomach is feeling better before using this Follow with your doctor as needed    Hayden RasmussenDavid Kalese Ensz, NP 09/04/15 1804  Hayden Rasmussenavid Ezel Vallone, NP 09/04/15 [phone]

## 2015-09-04 NOTE — Discharge Instructions (Signed)
Gastroesophageal Reflux Disease, Adult Normally, food travels down the esophagus and stays in the stomach to be digested. However, when a person has gastroesophageal reflux disease (GERD), food and stomach acid move back up into the esophagus. When this happens, the esophagus becomes sore and inflamed. Over time, GERD can create small holes (ulcers) in the lining of the esophagus.  CAUSES This condition is caused by a problem with the muscle between the esophagus and the stomach (lower esophageal sphincter, or LES). Normally, the LES muscle closes after food passes through the esophagus to the stomach. When the LES is weakened or abnormal, it does not close properly, and that allows food and stomach acid to go back up into the esophagus. The LES can be weakened by certain dietary substances, medicines, and medical conditions, including:  Tobacco use.  Pregnancy.  Having a hiatal hernia.  Heavy alcohol use.  Certain foods and beverages, such as coffee, chocolate, onions, and peppermint. RISK FACTORS This condition is more likely to develop in:  People who have an increased body weight.  People who have connective tissue disorders.  People who use NSAID medicines. SYMPTOMS Symptoms of this condition include:  Heartburn.  Difficult or painful swallowing.  The feeling of having a lump in the throat.  Abitter taste in the mouth.  Bad breath.  Having a large amount of saliva.  Having an upset or bloated stomach.  Belching.  Chest pain.  Shortness of breath or wheezing.  Ongoing (chronic) cough or a night-time cough.  Wearing away of tooth enamel.  Weight loss. Different conditions can cause chest pain. Make sure to see your health care provider if you experience chest pain. DIAGNOSIS Your health care provider will take a medical history and perform a physical exam. To determine if you have mild or severe GERD, your health care provider may also monitor how you respond  to treatment. You may also have other tests, including:  An endoscopy toexamine your stomach and esophagus with a small camera.  A test thatmeasures the acidity level in your esophagus.  A test thatmeasures how much pressure is on your esophagus.  A barium swallow or modified barium swallow to show the shape, size, and functioning of your esophagus. TREATMENT The goal of treatment is to help relieve your symptoms and to prevent complications. Treatment for this condition may vary depending on how severe your symptoms are. Your health care provider may recommend:  Changes to your diet.  Medicine.  Surgery. HOME CARE INSTRUCTIONS Diet  Follow a diet as recommended by your health care provider. This may involve avoiding foods and drinks such as:  Coffee and tea (with or without caffeine).  Drinks that containalcohol.  Energy drinks and sports drinks.  Carbonated drinks or sodas.  Chocolate and cocoa.  Peppermint and mint flavorings.  Garlic and onions.  Horseradish.  Spicy and acidic foods, including peppers, chili powder, curry powder, vinegar, hot sauces, and barbecue sauce.  Citrus fruit juices and citrus fruits, such as oranges, lemons, and limes.  Tomato-based foods, such as red sauce, chili, salsa, and pizza with red sauce.  Fried and fatty foods, such as donuts, french fries, potato chips, and high-fat dressings.  High-fat meats, such as hot dogs and fatty cuts of red and white meats, such as rib eye steak, sausage, ham, and bacon.  High-fat dairy items, such as whole milk, butter, and cream cheese.  Eat small, frequent meals instead of large meals.  Avoid drinking large amounts of liquid with your  meals.  Avoid eating meals during the 2-3 hours before bedtime.  Avoid lying down right after you eat.  Do not exercise right after you eat. General Instructions  Pay attention to any changes in your symptoms.  Take over-the-counter and prescription  medicines only as told by your health care provider. Do not take aspirin, ibuprofen, or other NSAIDs unless your health care provider told you to do so.  Do not use any tobacco products, including cigarettes, chewing tobacco, and e-cigarettes. If you need help quitting, ask your health care provider.  Wear loose-fitting clothing. Do not wear anything tight around your waist that causes pressure on your abdomen.  Raise (elevate) the head of your bed 6 inches (15cm).  Try to reduce your stress, such as with yoga or meditation. If you need help reducing stress, ask your health care provider.  If you are overweight, reduce your weight to an amount that is healthy for you. Ask your health care provider for guidance about a safe weight loss goal.  Keep all follow-up visits as told by your health care provider. This is important. SEEK MEDICAL CARE IF:  You have new symptoms.  You have unexplained weight loss.  You have difficulty swallowing, or it hurts to swallow.  You have wheezing or a persistent cough.  Your symptoms do not improve with treatment.  You have a hoarse voice. SEEK IMMEDIATE MEDICAL CARE IF:  You have pain in your arms, neck, jaw, teeth, or back.  You feel sweaty, dizzy, or light-headed.  You have chest pain or shortness of breath.  You vomit and your vomit looks like blood or coffee grounds.  You faint.  Your stool is bloody or black.  You cannot swallow, drink, or eat.   This information is not intended to replace advice given to you by your health care provider. Make sure you discuss any questions you have with your health care provider.   Document Released: 07/02/2005 Document Revised: 06/13/2015 Document Reviewed: 01/17/2015 Elsevier Interactive Patient Education 2016 [employer]  Nausea and Vomiting Nausea is a sick feeling that often comes before throwing up (vomiting). Vomiting is a reflex where stomach contents come out of your mouth. Vomiting can  cause severe loss of body fluids (dehydration). Children and elderly adults can become dehydrated quickly, especially if they also have diarrhea. Nausea and vomiting are symptoms of a condition or disease. It is important to find the cause of your symptoms. CAUSES   Direct irritation of the stomach lining. This irritation can result from increased acid production (gastroesophageal reflux disease), infection, food poisoning, taking certain medicines (such as nonsteroidal anti-inflammatory drugs), alcohol use, or tobacco use.  Signals from the brain.These signals could be caused by a headache, heat exposure, an inner ear disturbance, increased pressure in the brain from injury, infection, a tumor, or a concussion, pain, emotional stimulus, or metabolic problems.  An obstruction in the gastrointestinal tract (bowel obstruction).  Illnesses such as diabetes, hepatitis, gallbladder problems, appendicitis, kidney problems, cancer, sepsis, atypical symptoms of a heart attack, or eating disorders.  Medical treatments such as chemotherapy and radiation.  Receiving medicine that makes you sleep (general anesthetic) during surgery. DIAGNOSIS Your caregiver may ask for tests to be done if the problems do not improve after a few days. Tests may also be done if symptoms are severe or if the reason for the nausea and vomiting is not clear. Tests may include:  Urine tests.  Blood tests.  Stool tests.  Cultures (to look  for evidence of infection).  X-rays or other imaging studies. Test results can help your caregiver make decisions about treatment or the need for additional tests. TREATMENT You need to stay well hydrated. Drink frequently but in small amounts.You may wish to drink water, sports drinks, clear broth, or eat frozen ice pops or gelatin dessert to help stay hydrated.When you eat, eating slowly may help prevent nausea.There are also some antinausea medicines that may help prevent  nausea. HOME CARE INSTRUCTIONS   Take all medicine as directed by your caregiver.  If you do not have an appetite, do not force yourself to eat. However, you must continue to drink fluids.  If you have an appetite, eat a normal diet unless your caregiver tells you differently.  Eat a variety of complex carbohydrates (rice, wheat, potatoes, bread), lean meats, yogurt, fruits, and vegetables.  Avoid high-fat foods because they are more difficult to digest.  Drink enough water and fluids to keep your urine clear or pale yellow.  If you are dehydrated, ask your caregiver for specific rehydration instructions. Signs of dehydration may include:  Severe thirst.  Dry lips and mouth.  Dizziness.  Dark urine.  Decreasing urine frequency and amount.  Confusion.  Rapid breathing or pulse. SEEK IMMEDIATE MEDICAL CARE IF:   You have blood or brown flecks (like coffee grounds) in your vomit.  You have black or bloody stools.  You have a severe headache or stiff neck.  You are confused.  You have severe abdominal pain.  You have chest pain or trouble breathing.  You do not urinate at least once every 8 hours.  You develop cold or clammy skin.  You continue to vomit for longer than 24 to 48 hours.  You have a fever. MAKE SURE YOU:   Understand these instructions.  Will watch your condition.  Will get help right away if you are not doing well or get worse.   This information is not intended to replace advice given to you by your health care provider. Make sure you discuss any questions you have with your health care provider.   Document Released: 09/22/2005 Document Revised: 12/15/2011 Document Reviewed: 02/19/2011 Elsevier Interactive Patient Education 2016 Else[employer] Inc.  Viral Infections A virus is a type of germ. Viruses can cause:  Minor sore throats.  Aches and pains.  Headaches.  Runny nose.  Rashes.  Watery eyes.  Tiredness.  Coughs.  Loss of  appetite.  Feeling sick to your stomach (nausea).  Throwing up (vomiting).  Watery poop (diarrhea). HOME CARE   Only take medicines as told by your doctor.  Drink enough water and fluids to keep your pee (urine) clear or pale yellow. Sports drinks are a good choice.  Get plenty of rest and eat healthy. Soups and broths with crackers or rice are fine. GET HELP RIGHT AWAY IF:   You have a very bad headache.  You have shortness of breath.  You have chest pain or neck pain.  You have an unusual rash.  You cannot stop throwing up.  You have watery poop that does not stop.  You cannot keep fluids down.  You or your child has a temperature by mouth above 102 F (38.9 C), not controlled by medicine.  Your baby is older than 3 months with a rectal temperature of 102 F (38.9 C) or higher.  Your baby is 36 months old or younger with a rectal temperature of 100.4 F (38 C) or higher. MAKE SURE YOU:  Understand these instructions.  Will watch this condition.  Will get help right away if you are not doing well or get worse.   This information is not intended to replace advice given to you by your health care provider. Make sure you discuss any questions you have with your health care provider.   Document Released: 09/04/2008 Document Revised: 12/15/2011 Document Reviewed: 02/28/2015 Elsevier Interactive Patient Education 2016 [employer].  Constipation, Adult Miralax for constipation. Wait til your stomach is feeling better before using this Constipation is when a person:  Poops (has a bowel movement) less than 3 times a week.  Has a hard time pooping.  Has poop that is dry, hard, or bigger than normal. HOME CARE   Eat foods with a lot of fiber in them. This includes fruits, vegetables, beans, and whole grains such as brown rice.  Avoid fatty foods and foods with a lot of sugar. This includes french fries, hamburgers, cookies, candy, and soda.  If you are not  getting enough fiber from food, take products with added fiber in them (supplements).  Drink enough fluid to keep your pee (urine) clear or pale yellow.  Exercise on a regular basis, or as told by your doctor.  Go to the restroom when you feel like you need to poop. Do not hold it.  Only take medicine as told by your doctor. Do not take medicines that help you poop (laxatives) without talking to your doctor first. GET HELP RIGHT AWAY IF:   You have bright red blood in your poop (stool).  Your constipation lasts more than 4 days or gets worse.  You have belly (abdominal) or butt (rectal) pain.  You have thin poop (as thin as a pencil).  You lose weight, and it cannot be explained. MAKE SURE YOU:   Understand these instructions.  Will watch your condition.  Will get help right away if you are not doing well or get worse.   This information is not intended to replace advice given to you by your health care provider. Make sure you discuss any questions you have with your health care provider.   Document Released: 03/10/2008 Document Revised: 10/13/2014 Document Reviewed: 07/04/2013 Elsevier Interactive Patient Education [employer]

## 2015-09-23 ENCOUNTER — Emergency Department (HOSPITAL_COMMUNITY)
Admission: EM | Admit: 2015-09-23 | Discharge: 2015-09-23 | Disposition: A | Discharge status: Home / Self Care | Payer: Medicaid Other | Attending: Emergency Medicine | Admitting: Emergency Medicine

## 2015-09-23 ENCOUNTER — Encounter (HOSPITAL_COMMUNITY): Payer: Self-pay | Admitting: Emergency Medicine

## 2015-09-23 ENCOUNTER — Emergency Department (HOSPITAL_COMMUNITY): Payer: Medicaid Other

## 2015-09-23 DIAGNOSIS — R1012 Left upper quadrant pain: Secondary | ICD-10-CM | POA: Diagnosis not present

## 2015-09-23 DIAGNOSIS — Z3202 Encounter for pregnancy test, result negative: Secondary | ICD-10-CM | POA: Insufficient documentation

## 2015-09-23 DIAGNOSIS — Z8639 Personal history of other endocrine, nutritional and metabolic disease: Secondary | ICD-10-CM | POA: Diagnosis not present

## 2015-09-23 DIAGNOSIS — R112 Nausea with vomiting, unspecified: Secondary | ICD-10-CM | POA: Insufficient documentation

## 2015-09-23 DIAGNOSIS — F329 Major depressive disorder, single episode, unspecified: Secondary | ICD-10-CM | POA: Insufficient documentation

## 2015-09-23 DIAGNOSIS — F17200 Nicotine dependence, unspecified, uncomplicated: Secondary | ICD-10-CM | POA: Insufficient documentation

## 2015-09-23 DIAGNOSIS — K219 Gastro-esophageal reflux disease without esophagitis: Secondary | ICD-10-CM | POA: Insufficient documentation

## 2015-09-23 DIAGNOSIS — R1013 Epigastric pain: Secondary | ICD-10-CM | POA: Diagnosis present

## 2015-09-23 DIAGNOSIS — Z79899 Other long term (current) drug therapy: Secondary | ICD-10-CM | POA: Insufficient documentation

## 2015-09-23 DIAGNOSIS — R109 Unspecified abdominal pain: Secondary | ICD-10-CM

## 2015-09-23 LAB — CBC
HCT: 37.9 % (ref 36.0–46.0)
HEMOGLOBIN: 12.1 g/dL (ref 12.0–15.0)
MCH: 28.6 pg (ref 26.0–34.0)
MCHC: 31.9 g/dL (ref 30.0–36.0)
MCV: 89.6 fL (ref 78.0–100.0)
Platelets: 324 10*3/uL (ref 150–400)
RBC: 4.23 MIL/uL (ref 3.87–5.11)
RDW: 14.1 % (ref 11.5–15.5)
WBC: 16.4 10*3/uL — AB (ref 4.0–10.5)

## 2015-09-23 LAB — COMPREHENSIVE METABOLIC PANEL
ALK PHOS: 73 U/L (ref 38–126)
ALT: 18 U/L (ref 14–54)
AST: 28 U/L (ref 15–41)
Albumin: 3.4 g/dL — ABNORMAL LOW (ref 3.5–5.0)
Anion gap: 5 (ref 5–15)
BILIRUBIN TOTAL: 1.3 mg/dL — AB (ref 0.3–1.2)
BUN: 7 mg/dL (ref 6–20)
CALCIUM: 9.5 mg/dL (ref 8.9–10.3)
CO2: 26 mmol/L (ref 22–32)
Chloride: 104 mmol/L (ref 101–111)
Creatinine, Ser: 0.66 mg/dL (ref 0.44–1.00)
GFR calc Af Amer: >60 mL/min (ref >60)
Glucose, Bld: 98 mg/dL (ref 65–99)
POTASSIUM: 4.2 mmol/L (ref 3.5–5.1)
Sodium: 135 mmol/L (ref 135–145)
TOTAL PROTEIN: 6.3 g/dL — AB (ref 6.5–8.1)

## 2015-09-23 LAB — URINALYSIS, ROUTINE W REFLEX MICROSCOPIC
Bilirubin Urine: NEGATIVE
Glucose, UA: NEGATIVE mg/dL
Hgb urine dipstick: NEGATIVE
Ketones, ur: NEGATIVE mg/dL
LEUKOCYTES UA: NEGATIVE
NITRITE: NEGATIVE
Protein, ur: NEGATIVE mg/dL
SPECIFIC GRAVITY, URINE: 1.023 (ref 1.005–1.030)
pH: 8 (ref 5.0–8.0)

## 2015-09-23 LAB — PREGNANCY, URINE: Preg Test, Ur: NEGATIVE

## 2015-09-23 LAB — I-STAT BETA HCG BLOOD, ED (MC, WL, AP ONLY): I-stat hCG, quantitative: <5 m[IU]/mL (ref <5)

## 2015-09-23 LAB — RAPID STREP SCREEN (MED CTR MEBANE ONLY): Streptococcus, Group A Screen (Direct): NEGATIVE

## 2015-09-23 LAB — LIPASE, BLOOD: Lipase: 24 U/L (ref 11–51)

## 2015-09-23 MED ORDER — KETOROLAC TROMETHAMINE 15 MG/ML IJ SOLN
15.0000 mg | Freq: Once | INTRAMUSCULAR | Status: AC
  Administered 2015-09-23: 15 mg via INTRAVENOUS
  Filled 2015-09-23: qty 1

## 2015-09-23 MED ORDER — SODIUM CHLORIDE 0.9 % IV BOLUS (SEPSIS)
1000.0000 mL | Freq: Once | INTRAVENOUS | Status: AC
  Administered 2015-09-23: 1000 mL via INTRAVENOUS

## 2015-09-23 MED ORDER — DICYCLOMINE HCL 10 MG PO CAPS
10.0000 mg | ORAL_CAPSULE | Freq: Once | ORAL | Status: AC
  Administered 2015-09-23: 10 mg via ORAL
  Filled 2015-09-23: qty 1

## 2015-09-23 MED ORDER — FAMOTIDINE 20 MG PO TABS
20.0000 mg | ORAL_TABLET | Freq: Once | ORAL | Status: AC
  Administered 2015-09-23: 20 mg via ORAL
  Filled 2015-09-23: qty 1

## 2015-09-23 MED ORDER — ONDANSETRON HCL 4 MG/2ML IJ SOLN
4.0000 mg | Freq: Once | INTRAMUSCULAR | Status: AC
  Administered 2015-09-23: 4 mg via INTRAVENOUS
  Filled 2015-09-23: qty 2

## 2015-09-23 MED ORDER — ONDANSETRON 4 MG PO TBDP: ORAL_TABLET | ORAL | Status: DC

## 2015-09-23 MED ORDER — IOHEXOL 300 MG/ML  SOLN
100.0000 mL | Freq: Once | INTRAMUSCULAR | Status: AC | PRN
  Administered 2015-09-23: 100 mL via INTRAVENOUS

## 2015-09-23 NOTE — Discharge Instructions (Signed)
If you were given medicines take as directed.  If you are on coumadin or contraceptives realize their levels and effectiveness is altered by many different medicines.  If you have any reaction (rash, tongues swelling, other) to the medicines stop taking and see a physician.    If your blood pressure was elevated in the ER make sure you follow up for management with a primary doctor or return for chest pain, shortness of breath or stroke symptoms.  Please follow up as directed and return to the ER or see a physician for new or worsening symptoms.  Thank you. Filed Vitals:   09/23/15 1830 09/23/15 1915 09/23/15 2000 09/23/15 2015  BP: 109/61 91/52 108/68 110/60  Pulse: 72 73 56 67  Temp:      TempSrc:      Resp:      SpO2: 100% 99% 98% 97%

## 2015-09-23 NOTE — ED Provider Notes (Signed)
CSN: [id number]     Arrival date & time 09/23/15  1714 History   First MD Initiated Contact with Patient 09/23/15 1741     Chief Complaint  Patient presents with  . Abdominal Pain  . Nausea  . Emesis     (Consider location/radiation/quality/duration/timing/severity/associated sxs/prior Treatment) HPI Comments: 29 year old female history of depression, bipolar, alcohol abuse presents with intermittent epigastric left upper quadrant abdominal discomfort since last night. This started after drinking 3 glasses of milk. No focal radiation. Cramping and intermittent sharp component. Patient has had this in the past over this is more severe. Patient is a smoker. No known gallbladder problems in the past  Patient is a 29 y.o. female presenting with abdominal pain and vomiting. The history is provided by the patient.  Abdominal Pain Associated symptoms: nausea and vomiting   Associated symptoms: no chest pain, no chills, no diarrhea, no dysuria, no fever and no shortness of breath   Emesis Associated symptoms: abdominal pain   Associated symptoms: no chills, no diarrhea and no headaches     Past Medical History  Diagnosis Date  . Acid reflux   . Hypoglycemia    History reviewed. No pertinent past surgical history. No family history on file. Social History  Substance Use Topics  . Smoking status: Current Every Day Smoker  . Smokeless tobacco: Never Used  . Alcohol Use: Yes     Comment: Patient reports she drinks alcohol 1x a week   OB History    Gravida Para Term Preterm AB TAB SAB Ectopic Multiple Living   4 4 4       4      Review of Systems  Constitutional: Negative for fever and chills.  HENT: Negative for congestion.   Eyes: Negative for visual disturbance.  Respiratory: Negative for shortness of breath.   Cardiovascular: Negative for chest pain.  Gastrointestinal: Positive for nausea, vomiting and abdominal pain. Negative for diarrhea and blood in stool.  Genitourinary:  Negative for dysuria and flank pain.  Musculoskeletal: Negative for back pain, neck pain and neck stiffness.  Skin: Negative for rash.  Neurological: Negative for light-headedness and headaches.      Allergies  Review of patient's allergies indicates no known allergies.  Home Medications   Prior to Admission medications   Medication Sig Start Date End Date Taking? Authorizing Provider  ALPRAZolam Prudy Feeler(XANAX) 0.5 MG tablet Take 0.5 mg by mouth at bedtime as needed for anxiety.   Yes Historical Provider, MD  hydrOXYzine (ATARAX/VISTARIL) 25 MG tablet Take 25 mg by mouth at bedtime as needed (sleep).  09/06/15  Yes Historical Provider, MD  omeprazole (PRILOSEC) 20 MG capsule Take 1 capsule (20 mg total) by mouth daily. 09/04/15  Yes Hayden Rasmussenavid Mabe, NP  ondansetron (ZOFRAN) 4 MG tablet Take 1 tablet (4 mg total) by mouth every 6 (six) hours. Prn nausea or vomiting 09/04/15  Yes Hayden Rasmussenavid Mabe, NP  pantoprazole (PROTONIX) 40 MG tablet Take 40 mg by mouth 2 (two) times daily. 09/06/15  Yes Historical Provider, MD  Vitamin D, Ergocalciferol, (DRISDOL) 50000 UNITS CAPS capsule Take 1 capsule (50,000 Units total) by mouth every 7 (seven) days. 06/19/15  Yes Elvina SidleKurt Lauenstein, MD  ondansetron (ZOFRAN ODT) 4 MG disintegrating tablet 4mg  ODT q4 hours prn nausea/vomit 09/23/15   Blane OharaJoshua Crimson Dubberly, MD   BP 110/60 mmHg  Pulse 67  Temp(Src) 97.8 F (36.6 C) (Oral)  Resp 18  SpO2 97%  LMP 09/10/2015 Physical Exam  Constitutional: She is oriented to person, place, and  time. She appears well-developed and well-nourished.  HENT:  Head: Normocephalic and atraumatic.  Mild dry mucous membranes  Eyes: Conjunctivae are normal. Right eye exhibits no discharge. Left eye exhibits no discharge.  Neck: Normal range of motion. Neck supple. No tracheal deviation present.  Cardiovascular: Normal rate and regular rhythm.   Pulmonary/Chest: Effort normal and breath sounds normal.  Abdominal: Soft. She exhibits no distension. There  is tenderness (mild epigastric left upper quadrant). There is no guarding.  Musculoskeletal: She exhibits no edema.  Neurological: She is alert and oriented to person, place, and time.  Skin: Skin is warm. No rash noted.  Psychiatric: She has a normal mood and affect.  Nursing note and vitals reviewed.   ED Course  Procedures (including critical care time) Labs Review Labs Reviewed  COMPREHENSIVE METABOLIC PANEL - Abnormal; Notable for the following:    Total Protein 6.3 (*)    Albumin 3.4 (*)    Total Bilirubin 1.3 (*)    All other components within normal limits  CBC - Abnormal; Notable for the following:    WBC 16.4 (*)    All other components within normal limits  RAPID STREP SCREEN (NOT AT [facility])  [facility]  LIPASE, BLOOD  URINALYSIS, ROUTINE W REFLEX MICROSCOPIC (NOT AT [facility])  PREGNANCY, URINE  I-STAT BETA HCG BLOOD, ED ([facility], [facility], [facility] ONLY)    Imaging Review Ct Abdomen Pelvis W Contrast  09/23/2015  CLINICAL DATA:  Left upper quadrant pain with nausea and vomiting. EXAM: CT ABDOMEN AND PELVIS WITH CONTRAST TECHNIQUE: Multidetector CT imaging of the abdomen and pelvis was performed using the standard protocol following bolus administration of intravenous contrast. CONTRAST:  OMNIPAQUE IOHEXOL 300 MG/ML  SOLN COMPARISON:  None available FINDINGS: Lower chest and abdominal wall:  No contributory findings. Hepatobiliary: Probable 1 cm cyst in the upper right liver. A sub cm low-density in the right liver on image 29 of series 2 is too small for characterization.No evidence of biliary obstruction or stone. Pancreas: Unremarkable. Spleen: Unremarkable. Adrenals/Urinary Tract: Negative adrenals. No hydronephrosis or stone. Unremarkable bladder. Reproductive:No pathologic findings. Stomach/Bowel: Borderline distal colonic wall thickening, but stable from prior and likely from underdistention. No obstruction. No appendicitis. Vascular/Lymphatic: There is a subtle eccentric  low-density in an ileal portal venous branch on series 2, image 44. There is a high probability this represents an artifact. Even if a true finding, eccentric flat appearance would suggest scar from remote thrombus. There is no venous congestion or occlusion. No mass or adenopathy. Peritoneal: No ascites or pneumoperitoneum. Musculoskeletal: No acute abnormalities. IMPRESSION: No acute finding or explanation for symptoms. Electronically Signed   By: Marnee Spring M.D.   On: 09/23/2015 22:13   I have personally reviewed and evaluated these images and lab results as part of my medical decision-making.   EKG Interpretation None      MDM   Final diagnoses:  Abdominal cramping   Well-appearing female presents with upper abdominal pain with clinical concern for mild gastritis. No concern for emergent process at this time reasons to return given. Plan for screening blood work, IV fluids, supportive/symptomatic care. Patient does have reflux history. CT results reviewed no acute findings. Results and differential diagnosis were discussed with the patient/parent/guardian. Xrays were independently reviewed by myself.  Close follow up outpatient was discussed, comfortable with the plan.   Medications  dicyclomine (BENTYL) capsule 10 mg (10 mg Oral Given 09/23/15 1818)  ondansetron (ZOFRAN) injection 4 mg (4 mg Intravenous Given 09/23/15  1818)  sodium chloride 0.9 % bolus 1,000 mL (0 mLs Intravenous Stopped 09/23/15 1943)  famotidine (PEPCID) tablet 20 mg (20 mg Oral Given 09/23/15 1818)  ketorolac (TORADOL) 15 MG/ML injection 15 mg (15 mg Intravenous Given 09/23/15 1938)  iohexol (OMNIPAQUE) 300 MG/ML solution 100 mL (100 mLs Intravenous Contrast Given 09/23/15 2117)    Filed Vitals:   09/23/15 1830 09/23/15 1915 09/23/15 2000 09/23/15 2015  BP: 109/61 91/52 108/68 110/60  Pulse: 72 73 56 67  Temp:      TempSrc:      Resp:      SpO2: 100% 99% 98% 97%    Final diagnoses:  Abdominal  cramping       Blane Ohara, MD 09/23/15 2232

## 2015-09-23 NOTE — ED Notes (Signed)
Pt c/o abdominal pain with N/V onset yesterday. Pt reports that she drank 3 cups of milk prior to pain onset. Pt also reports feeling gassy.

## 2015-09-23 NOTE — ED Notes (Signed)
Ct notified of physician order for no PO contrast for CT scan.

## 2015-09-23 NOTE — ED Notes (Signed)
Patient transported to CT 

## 2015-09-25 LAB — CULTURE, GROUP A STREP

## 2015-10-18 ENCOUNTER — Encounter: Payer: Self-pay | Admitting: Internal Medicine

## 2015-12-04 ENCOUNTER — Inpatient Hospital Stay (HOSPITAL_COMMUNITY)
Admission: AD | Admit: 2015-12-04 | Discharge: 2015-12-04 | Disposition: A | Discharge status: Home / Self Care | Payer: Medicaid Other | Source: Ambulatory Visit | Attending: Obstetrics & Gynecology | Admitting: Obstetrics & Gynecology

## 2015-12-04 ENCOUNTER — Encounter (HOSPITAL_COMMUNITY): Payer: Self-pay | Admitting: *Deleted

## 2015-12-04 DIAGNOSIS — N939 Abnormal uterine and vaginal bleeding, unspecified: Secondary | ICD-10-CM | POA: Diagnosis not present

## 2015-12-04 DIAGNOSIS — F17210 Nicotine dependence, cigarettes, uncomplicated: Secondary | ICD-10-CM | POA: Diagnosis not present

## 2015-12-04 DIAGNOSIS — Z3202 Encounter for pregnancy test, result negative: Secondary | ICD-10-CM | POA: Diagnosis not present

## 2015-12-04 HISTORY — DX: Depression, unspecified: F32.A

## 2015-12-04 HISTORY — DX: Major depressive disorder, single episode, unspecified: F32.9

## 2015-12-04 HISTORY — DX: Anxiety disorder, unspecified: F41.9

## 2015-12-04 LAB — URINE MICROSCOPIC-ADD ON

## 2015-12-04 LAB — URINALYSIS, ROUTINE W REFLEX MICROSCOPIC
Bilirubin Urine: NEGATIVE
GLUCOSE, UA: NEGATIVE mg/dL
KETONES UR: NEGATIVE mg/dL
LEUKOCYTES UA: NEGATIVE
Nitrite: NEGATIVE
PH: 7 (ref 5.0–8.0)
Protein, ur: NEGATIVE mg/dL
Specific Gravity, Urine: 1.02 (ref 1.005–1.030)

## 2015-12-04 LAB — CBC WITH DIFFERENTIAL/PLATELET
Basophils Absolute: 0.1 10*3/uL (ref 0.0–0.1)
Basophils Relative: 1 %
EOS ABS: 1.7 10*3/uL — AB (ref 0.0–0.7)
Eosinophils Relative: 15 %
HEMATOCRIT: 37.6 % (ref 36.0–46.0)
HEMOGLOBIN: 12.4 g/dL (ref 12.0–15.0)
LYMPHS ABS: 2.9 10*3/uL (ref 0.7–4.0)
Lymphocytes Relative: 25 %
MCH: 28.9 pg (ref 26.0–34.0)
MCHC: 33 g/dL (ref 30.0–36.0)
MCV: 87.6 fL (ref 78.0–100.0)
MONO ABS: 0.6 10*3/uL (ref 0.1–1.0)
MONOS PCT: 5 %
NEUTROS PCT: 54 %
Neutro Abs: 6.3 10*3/uL (ref 1.7–7.7)
Platelets: 364 10*3/uL (ref 150–400)
RBC: 4.29 MIL/uL (ref 3.87–5.11)
RDW: 13.7 % (ref 11.5–15.5)
WBC: 11.6 10*3/uL — ABNORMAL HIGH (ref 4.0–10.5)

## 2015-12-04 LAB — POCT PREGNANCY, URINE: Preg Test, Ur: NEGATIVE

## 2015-12-04 NOTE — MAU Note (Addendum)
Pt stated she had a period that started on the 2/11 and stopped on  2/16. Then started bleeding again on 2/21 and stopped yesterday. Also c/o abd  And back pain cramping and pain when she urinates Pt reports she is also having nausea in the morning.

## 2015-12-04 NOTE — MAU Provider Note (Signed)
History     CSN: [id number]  Arrival date and time: 12/04/15 2014   First Provider Initiated Contact with Patient 12/04/15 2044      Chief Complaint  Patient presents with  . Vaginal Bleeding   HPI  Tonya Yates is a 30 y.o. [id number] who presents to MAU today with complaint of irregular vaginal bleeding. The patient states that in January and February she has had 2 periods/month. She states LMP of 11/27/15, but also bled from 2/11-2/16. She states that during her cycles cramping has been much worse than previously. She denies pain or bleeding today. She states that prior to January she had regular periods. She denies any recent birth control use. She is not currently sexually active. She had recent negative STD testing at her PCP at [facility]. She also endorses occasional dysuria which has been going on "forever." She denies fever. She has has some nausea occasionally, but none now. Also denies vomiting or diarrhea.   OB History    Gravida Para Term Preterm AB TAB SAB Ectopic Multiple Living   Past Medical History  Diagnosis Date  . Acid reflux   . Hypoglycemia   . Anxiety   . Depression     Past Surgical History  Procedure Laterality Date  . Eye surgery      No family history on file.  Social History  Substance Use Topics  . Smoking status: Light Tobacco Smoker  . Smokeless tobacco: Never Used  . Alcohol Use: Yes     Comment: Patient reports she drinks alcohol 1x a week    Allergies: No Known Allergies  Prescriptions prior to admission  Medication Sig Dispense Refill Last Dose  . ibuprofen (ADVIL,MOTRIN) 800 MG tablet Take 1 tablet by mouth 3 (three) times daily as needed for mild pain.   2 Past Week at Unknown time  . loratadine (CLARITIN) 10 MG tablet Take 10 mg by mouth daily.   Past Week at Unknown time  . omeprazole (PRILOSEC) 20 MG capsule Take 1 capsule (20 mg total) by mouth daily. (Patient not taking: Reported on  12/04/2015) 20 capsule 0 Not Taking at Unknown time  . ondansetron (ZOFRAN ODT) 4 MG disintegrating tablet  ODT q4 hours prn nausea/vomit (Patient not taking: Reported on 12/04/2015) 4 tablet 0 Not Taking at Unknown time  . ondansetron (ZOFRAN) 4 MG tablet Take 1 tablet (4 mg total) by mouth every 6 (six) hours. Prn nausea or vomiting (Patient not taking: Reported on 12/04/2015) 12 tablet 0 Not Taking at Unknown time  . Vitamin D, Ergocalciferol, (DRISDOL) 50000 UNITS CAPS capsule Take 1 capsule (50,000 Units total) by mouth every 7 (seven) days. (Patient not taking: Reported on 12/04/2015) 10 capsule 1 Not Taking at Unknown time    Review of Systems  Constitutional: Positive for malaise/fatigue. Negative for fever.  Gastrointestinal: Negative for nausea, vomiting, abdominal pain, diarrhea and constipation.  Genitourinary: Positive for dysuria. Negative for urgency and frequency.       Neg - vaginal bleeding, discharge  Neurological: Positive for weakness. Negative for dizziness and loss of consciousness.   Physical Exam   Blood pressure 119/58, pulse 18, temperature 98 F (36.7 C), temperature source Oral, resp. rate 57.  Physical Exam  Nursing note and vitals reviewed. Constitutional: She is oriented to person, place, and time. She appears well-developed and well-nourished. No distress.  HENT:  Head: Normocephalic and atraumatic.  Cardiovascular: Normal rate.   Respiratory: Effort normal.  GI: Soft. She exhibits no distension and no mass. There is no tenderness. There is no rebound and no guarding.  Genitourinary: Uterus is not enlarged and not tender. Cervix exhibits no motion tenderness, no discharge and no friability. Right adnexum displays no mass and no tenderness. Left adnexum displays no mass and no tenderness.  Neurological: She is alert and oriented to person, place, and time.  Skin: Skin is warm and dry. No erythema.  Psychiatric: She has a normal mood and affect.     Results for orders placed or performed during the hospital encounter of 12/04/15 (from the past 24 hour(s))  Urinalysis, Routine w reflex microscopic (not at [facility])     Status: Abnormal   Collection Time: 12/04/15  8:25 PM  Result Value Ref Range   Color, Urine YELLOW YELLOW   APPearance CLEAR CLEAR   Specific Gravity, Urine 1.020 1.005 - 1.030   pH 7.0 5.0 - 8.0   Glucose, UA NEGATIVE NEGATIVE mg/dL   Hgb urine dipstick TRACE (A) NEGATIVE   Bilirubin Urine NEGATIVE NEGATIVE   Ketones, ur NEGATIVE NEGATIVE mg/dL   Protein, ur NEGATIVE NEGATIVE mg/dL   Nitrite NEGATIVE NEGATIVE   Leukocytes, UA NEGATIVE NEGATIVE  Urine microscopic-add on     Status: Abnormal   Collection Time: 12/04/15  8:25 PM  Result Value Ref Range   Squamous Epithelial / LPF 0-5 (A) NONE SEEN   WBC, UA 0-5 0 - 5 WBC/hpf   RBC / HPF 0-5 0 - 5 RBC/hpf   Bacteria, UA RARE (A) NONE SEEN   Urine-Other MUCOUS PRESENT   Pregnancy, urine POC     Status: None   Collection Time: 12/04/15  8:37 PM  Result Value Ref Range   Preg Test, Ur NEGATIVE NEGATIVE  CBC with Differential/Platelet     Status: Abnormal   Collection Time: 12/04/15  9:00 PM  Result Value Ref Range   WBC 11.6 (H) 4.0 - 10.5 K/uL   RBC 4.29 3.87 - 5.11 MIL/uL   Hemoglobin 12.4 12.0 - 15.0 g/dL   HCT 16.1 09.6 - 04.5 %   MCV 87.6 78.0 - 100.0 fL   MCH 28.9 26.0 - 34.0 pg   MCHC 33.0 30.0 - 36.0 g/dL   RDW 40.9 81.1 - 91.4 %   Platelets 364 150 - 400 K/uL   Neutrophils Relative % 54 %   Neutro Abs 6.3 1.7 - 7.7 K/uL   Lymphocytes Relative 25 %   Lymphs Abs 2.9 0.7 - 4.0 K/uL   Monocytes Relative 5 %   Monocytes Absolute 0.6 0.1 - 1.0 K/uL   Eosinophils Relative 15 %   Eosinophils Absolute 1.7 (H) 0.0 - 0.7 K/uL   Basophils Relative 1 %   Basophils Absolute 0.1 0.0 - 0.1 K/uL    MAU Course  Procedures None  MDM UPT - negative UA and CBC today Patient is hemodynamically stable today. Will continue work-up as outpatient.  Assessment  and Plan  A: Abnormal uterine bleeding   P: Discharge home Bleeding precautions discussed Outpatient [place] ordered. They will contact patient with appt date/time.  Patient advised to follow-up with [facility]. They will contact patient with appt date/time for further work-up of AUB and pap smear Patient may return to MAU as needed or if her condition were to change or worsen   Marny Lowenstein, PA-C  12/04/2015, 9:53 PM

## 2015-12-04 NOTE — Discharge Instructions (Signed)
Abnormal Uterine Bleeding °Abnormal uterine bleeding means bleeding from the vagina that is not your normal menstrual period. This can be: °· Bleeding or spotting between periods. °· Bleeding after sex (sexual intercourse). °· Bleeding that is heavier or more than normal. °· Periods that last longer than usual. °· Bleeding after menopause. °There are many problems that may cause this. Treatment will depend on the cause of the bleeding. Any kind of bleeding that is not normal should be reviewed by your doctor.  °HOME CARE °Watch your condition for any changes. These actions may lessen any discomfort you are having: °· Do not use tampons or douches as told by your doctor. °· Change your pads often. °You should get regular pelvic exams and Pap tests. Keep all appointments for tests as told by your doctor. °GET HELP IF: °· You are bleeding for more than 1 week. °· You feel dizzy at times. °GET HELP RIGHT AWAY IF:  °· You pass out. °· You have to change pads every 15 to 30 minutes. °· You have belly pain. °· You have a fever. °· You become sweaty or weak. °· You are passing large blood clots from the vagina. °· You feel sick to your stomach (nauseous) and throw up (vomit). °MAKE SURE YOU: °· Understand these instructions. °· Will watch your condition. °· Will get help right away if you are not doing well or get worse. °  °This information is not intended to replace advice given to you by your health care provider. Make sure you discuss any questions you have with your health care provider. °  °Document Released: 07/20/2009 Document Revised: 09/27/2013 Document Reviewed: 04/21/2013 °Elsevier Interactive Patient Education ©2016 Elsevier Inc. ° °

## 2015-12-21 ENCOUNTER — Encounter: Payer: Self-pay | Admitting: Obstetrics & Gynecology

## 2016-01-21 ENCOUNTER — Encounter (HOSPITAL_COMMUNITY): Payer: Self-pay | Admitting: *Deleted

## 2016-01-21 ENCOUNTER — Inpatient Hospital Stay (HOSPITAL_COMMUNITY): Payer: Medicaid Other

## 2016-01-21 ENCOUNTER — Inpatient Hospital Stay (HOSPITAL_COMMUNITY)
Admission: AD | Admit: 2016-01-21 | Discharge: 2016-01-21 | Disposition: A | Discharge status: Home / Self Care | Payer: Medicaid Other | Source: Ambulatory Visit | Attending: Obstetrics & Gynecology | Admitting: Obstetrics & Gynecology

## 2016-01-21 DIAGNOSIS — O99331 Smoking (tobacco) complicating pregnancy, first trimester: Secondary | ICD-10-CM | POA: Diagnosis not present

## 2016-01-21 DIAGNOSIS — Z3A01 Less than 8 weeks gestation of pregnancy: Secondary | ICD-10-CM | POA: Insufficient documentation

## 2016-01-21 DIAGNOSIS — K5900 Constipation, unspecified: Secondary | ICD-10-CM | POA: Insufficient documentation

## 2016-01-21 DIAGNOSIS — O3680X0 Pregnancy with inconclusive fetal viability, not applicable or unspecified: Secondary | ICD-10-CM

## 2016-01-21 DIAGNOSIS — N760 Acute vaginitis: Secondary | ICD-10-CM | POA: Diagnosis not present

## 2016-01-21 DIAGNOSIS — A499 Bacterial infection, unspecified: Secondary | ICD-10-CM | POA: Diagnosis not present

## 2016-01-21 DIAGNOSIS — O99311 Alcohol use complicating pregnancy, first trimester: Secondary | ICD-10-CM | POA: Insufficient documentation

## 2016-01-21 DIAGNOSIS — O23591 Infection of other part of genital tract in pregnancy, first trimester: Secondary | ICD-10-CM | POA: Diagnosis not present

## 2016-01-21 DIAGNOSIS — O26891 Other specified pregnancy related conditions, first trimester: Secondary | ICD-10-CM | POA: Diagnosis not present

## 2016-01-21 DIAGNOSIS — B9689 Other specified bacterial agents as the cause of diseases classified elsewhere: Secondary | ICD-10-CM

## 2016-01-21 DIAGNOSIS — R109 Unspecified abdominal pain: Secondary | ICD-10-CM | POA: Insufficient documentation

## 2016-01-21 DIAGNOSIS — O26899 Other specified pregnancy related conditions, unspecified trimester: Secondary | ICD-10-CM

## 2016-01-21 LAB — WET PREP, GENITAL
Sperm: NONE SEEN
TRICH WET PREP: NONE SEEN
Yeast Wet Prep HPF POC: NONE SEEN

## 2016-01-21 LAB — URINALYSIS, ROUTINE W REFLEX MICROSCOPIC
Bilirubin Urine: NEGATIVE
Glucose, UA: NEGATIVE mg/dL
Ketones, ur: NEGATIVE mg/dL
Leukocytes, UA: NEGATIVE
NITRITE: NEGATIVE
Protein, ur: NEGATIVE mg/dL
SPECIFIC GRAVITY, URINE: 1.02 (ref 1.005–1.030)
pH: 6.5 (ref 5.0–8.0)

## 2016-01-21 LAB — CBC WITH DIFFERENTIAL/PLATELET
BASOS PCT: 0 %
Basophils Absolute: 0 10*3/uL (ref 0.0–0.1)
EOS ABS: 1.8 10*3/uL — AB (ref 0.0–0.7)
EOS PCT: 18 %
HCT: 37.2 % (ref 36.0–46.0)
HEMOGLOBIN: 12.3 g/dL (ref 12.0–15.0)
Lymphocytes Relative: 28 %
Lymphs Abs: 2.9 10*3/uL (ref 0.7–4.0)
MCH: 28.7 pg (ref 26.0–34.0)
MCHC: 33.1 g/dL (ref 30.0–36.0)
MCV: 86.9 fL (ref 78.0–100.0)
Monocytes Absolute: 0.4 10*3/uL (ref 0.1–1.0)
Monocytes Relative: 4 %
Neutro Abs: 5.2 10*3/uL (ref 1.7–7.7)
Neutrophils Relative %: 50 %
PLATELETS: 283 10*3/uL (ref 150–400)
RBC: 4.28 MIL/uL (ref 3.87–5.11)
RDW: 14.1 % (ref 11.5–15.5)
WBC: 10.3 10*3/uL (ref 4.0–10.5)

## 2016-01-21 LAB — URINE MICROSCOPIC-ADD ON

## 2016-01-21 LAB — GC/CHLAMYDIA PROBE AMP (~~LOC~~) NOT AT ARMC
CHLAMYDIA, DNA PROBE: NEGATIVE
Neisseria Gonorrhea: NEGATIVE

## 2016-01-21 LAB — HIV ANTIBODY (ROUTINE TESTING W REFLEX): HIV Screen 4th Generation wRfx: NONREACTIVE

## 2016-01-21 LAB — RPR: RPR Ser Ql: NONREACTIVE

## 2016-01-21 LAB — HCG, QUANTITATIVE, PREGNANCY: HCG, BETA CHAIN, QUANT, S: 2956 m[IU]/mL — AB (ref <5)

## 2016-01-21 LAB — POCT PREGNANCY, URINE
PREG TEST UR: POSITIVE — AB
PREG TEST UR: POSITIVE — AB

## 2016-01-21 MED ORDER — METRONIDAZOLE 500 MG PO TABS: 500.0000 mg | ORAL_TABLET | Freq: Two times a day (BID) | ORAL | Status: DC

## 2016-01-21 NOTE — Discharge Instructions (Signed)
First Trimester of Pregnancy °The first trimester of pregnancy is from week 1 until the end of week 12 (months 1 through 3). During this time, your baby will begin to develop inside you. At 6-8 weeks, the eyes and face are formed, and the heartbeat can be seen on ultrasound. At the end of 12 weeks, all the baby's organs are formed. Prenatal care is all the medical care you receive before the birth of your baby. Make sure you get good prenatal care and follow all of your doctor's instructions. °HOME CARE  °Medicines °· Take medicine only as told by your doctor. Some medicines are safe and some are not during pregnancy. °· Take your prenatal vitamins as told by your doctor. °· Take medicine that helps you poop (stool softener) as needed if your doctor says it is okay. °Diet °· Eat regular, healthy meals. °· Your doctor will tell you the amount of weight gain that is right for you. °· Avoid raw meat and uncooked cheese. °· If you feel sick to your stomach (nauseous) or throw up (vomit): °¨ Eat 4 or 5 small meals a day instead of 3 large meals. °¨ Try eating a few soda crackers. °¨ Drink liquids between meals instead of during meals. °· If you have a hard time pooping (constipation): °¨ Eat high-fiber foods like fresh vegetables, fruit, and whole grains. °¨ Drink enough fluids to keep your pee (urine) clear or pale yellow. °Activity and Exercise °· Exercise only as told by your doctor. Stop exercising if you have cramps or pain in your lower belly (abdomen) or low back. °· Try to avoid standing for long periods of time. Move your legs often if you must stand in one place for a long time. °· Avoid heavy lifting. °· Wear low-heeled shoes. Sit and stand up straight. °· You can have sex unless your doctor tells you not to. °Relief of Pain or Discomfort °· Wear a good support bra if your breasts are sore. °· Take warm water baths (sitz baths) to soothe pain or discomfort caused by hemorrhoids. Use hemorrhoid cream if your  doctor says it is okay. °· Rest with your legs raised if you have leg cramps or low back pain. °· Wear support hose if you have puffy, bulging veins (varicose veins) in your legs. Raise (elevate) your feet for 15 minutes, 3-4 times a day. Limit salt in your diet. °Prenatal Care °· Schedule your prenatal visits by the twelfth week of pregnancy. °· Write down your questions. Take them to your prenatal visits. °· Keep all your prenatal visits as told by your doctor. °Safety °· Wear your seat belt at all times when driving. °· Make a list of emergency phone numbers. The list should include numbers for family, friends, the hospital, and police and fire departments. °General Tips °· Ask your doctor for a referral to a local prenatal class. Begin classes no later than at the start of month 6 of your pregnancy. °· Ask for help if you need counseling or help with nutrition. Your doctor can give you advice or tell you where to go for help. °· Do not use hot tubs, steam rooms, or saunas. °· Do not douche or use tampons or scented sanitary pads. °· Do not cross your legs for long periods of time. °· Avoid litter boxes and soil used by cats. °· Avoid all smoking, herbs, and alcohol. Avoid drugs not approved by your doctor. °· Do not use any tobacco products, including cigarettes,   chewing tobacco, and electronic cigarettes. If you need help quitting, ask your doctor. You may get counseling or other support to help you quit. °· Visit your dentist. At home, brush your teeth with a soft toothbrush. Be gentle when you floss. °GET HELP IF: °· You are dizzy. °· You have mild cramps or pressure in your lower belly. °· You have a nagging pain in your belly area. °· You continue to feel sick to your stomach, throw up, or have watery poop (diarrhea). °· You have a bad smelling fluid coming from your vagina. °· You have pain with peeing (urination). °· You have increased puffiness (swelling) in your face, hands, legs, or ankles. °GET HELP  RIGHT AWAY IF:  °· You have a fever. °· You are leaking fluid from your vagina. °· You have spotting or bleeding from your vagina. °· You have very bad belly cramping or pain. °· You gain or lose weight rapidly. °· You throw up blood. It may look like coffee grounds. °· You are around people who have German measles, fifth disease, or chickenpox. °· You have a very bad headache. °· You have shortness of breath. °· You have any kind of trauma, such as from a fall or a car accident. °  °This information is not intended to replace advice given to you by your health care provider. Make sure you discuss any questions you have with your health care provider. °  °Document Released: 03/10/2008 Document Revised: 10/13/2014 Document Reviewed: 08/02/2013 °Elsevier Interactive Patient Education ©2016 Elsevier Inc. °Bacterial Vaginosis °Bacterial vaginosis is an infection of the vagina. It happens when too many germs (bacteria) grow in the vagina. Having this infection puts you at risk for getting other infections from sex. Treating this infection can help lower your risk for other infections, such as:  °· Chlamydia. °· Gonorrhea. °· HIV. °· Herpes. °HOME CARE °· Take your medicine as told by your doctor. °· Finish your medicine even if you start to feel better. °· Tell your sex partner that you have an infection. They should see their doctor for treatment. °· During treatment: °¨ Avoid sex or use condoms correctly. °¨ Do not douche. °¨ Do not drink alcohol unless your doctor tells you it is ok. °¨ Do not breastfeed unless your doctor tells you it is ok. °GET HELP IF: °· You are not getting better after 3 days of treatment. °· You have more grey fluid (discharge) coming from your vagina than before. °· You have more pain than before. °· You have a fever. °MAKE SURE YOU:  °· Understand these instructions. °· Will watch your condition. °· Will get help right away if you are not doing well or get worse. °  °This information is not  intended to replace advice given to you by your health care provider. Make sure you discuss any questions you have with your health care provider. °  °Document Released: 07/01/2008 Document Revised: 10/13/2014 Document Reviewed: 05/04/2013 °Elsevier Interactive Patient Education ©2016 Elsevier Inc. ° °

## 2016-01-21 NOTE — MAU Provider Note (Signed)
History     CSN: [id number]  Arrival date and time: 01/21/16 4098   First Provider Initiated Contact with Patient 01/21/16 0131      Chief Complaint  Patient presents with  . Abdominal Cramping   HPI [name] is a 30 y.o. [phone] at [redacted]w[redacted]d who presents to MAU today with complaint of abdominal cramping x 2 weeks. The patient states recent +HPT and LMP of 12/27/15 although she states she had 2 periods last month. She states that pain comes and goes and is rated at 6/10 now. She has not taken anything for pain. She states last intercourse was 2 days ago. She has had some nausea without vomiting or diarrhea. She does endorse constipation, but last BM was today. She denies vaginal bleeding, discharge or fever. She states a history of multiple SABs and believes it is because she doesn't produce enough hormone. She feels she needs hormone supplements because her sister had the same problem.    OB History    Gravida Para Term Preterm AB TAB SAB Ectopic Multiple Living   Past Medical History  Diagnosis Date  . Acid reflux   . Hypoglycemia   . Anxiety   . Depression     Past Surgical History  Procedure Laterality Date  . Eye surgery      History reviewed. No pertinent family history.  Social History  Substance Use Topics  . Smoking status: Light Tobacco Smoker  . Smokeless tobacco: Never Used  . Alcohol Use: Yes     Comment: Patient reports she drinks alcohol 1x a week    Allergies: No Known Allergies  Prescriptions prior to admission  Medication Sig Dispense Refill Last Dose  . ibuprofen (ADVIL,MOTRIN) 800 MG tablet Take 1 tablet by mouth 3 (three) times daily as needed for mild pain.   2 Past Week at Unknown time  . loratadine (CLARITIN) 10 MG tablet Take 10 mg by mouth daily.   Past Week at Unknown time    Review of Systems  Constitutional: Negative for fever and malaise/fatigue.  Gastrointestinal: Positive for nausea and abdominal pain.  Negative for vomiting, diarrhea and constipation.  Genitourinary: Negative for dysuria, urgency and frequency.       Neg - vaginal bleeding, discharge   Physical Exam   Blood pressure 123/69, pulse 60, temperature 98.3 F (36.8 C), temperature source Oral, resp. rate 16, height  (1.549 m), weight 170 lb (77.111 kg), last menstrual period 12/27/2015, SpO2 100 %.  Physical Exam  Nursing note and vitals reviewed. Constitutional: She is oriented to person, place, and time. She appears well-developed and well-nourished. No distress.  HENT:  Head: Normocephalic and atraumatic.  Cardiovascular: Normal rate.   Respiratory: Effort normal.  GI: Soft. She exhibits no distension and no mass. There is no tenderness. There is no rebound and no guarding.  Genitourinary: Uterus is not enlarged and not tender. Cervix exhibits no motion tenderness, no discharge and no friability. Right adnexum displays tenderness (mild). Right adnexum displays no mass. Left adnexum displays no mass. No bleeding in the vagina. Vaginal discharge (small amount of thin, white discharge noted) found.  Neurological: She is alert and oriented to person, place, and time.  Skin: Skin is warm and dry. No erythema.  Psychiatric: She has a normal mood and affect.    Results for orders placed or performed during the hospital encounter of 01/21/16 (from the past  24 hour(s))  Urinalysis, Routine w reflex microscopic (not at [facility])     Status: Abnormal   Collection Time: 01/21/16 12:59 AM  Result Value Ref Range   Color, Urine YELLOW YELLOW   APPearance CLEAR CLEAR   Specific Gravity, Urine 1.020 1.005 - 1.030   pH 6.5 5.0 - 8.0   Glucose, UA NEGATIVE NEGATIVE mg/dL   Hgb urine dipstick SMALL (A) NEGATIVE   Bilirubin Urine NEGATIVE NEGATIVE   Ketones, ur NEGATIVE NEGATIVE mg/dL   Protein, ur NEGATIVE NEGATIVE mg/dL   Nitrite NEGATIVE NEGATIVE   Leukocytes, UA NEGATIVE NEGATIVE  Urine microscopic-add on     Status: Abnormal    Collection Time: 01/21/16 12:59 AM  Result Value Ref Range   Squamous Epithelial / LPF 6-30 (A) NONE SEEN   WBC, UA 0-5 0 - 5 WBC/hpf   RBC / HPF 0-5 0 - 5 RBC/hpf   Bacteria, UA FEW (A) NONE SEEN   Urine-Other MUCOUS PRESENT   Pregnancy, urine POC     Status: Abnormal   Collection Time: 01/21/16  1:08 AM  Result Value Ref Range   Preg Test, Ur POSITIVE (A) NEGATIVE  Pregnancy, urine POC     Status: Abnormal   Collection Time: 01/21/16  1:31 AM  Result Value Ref Range   Preg Test, Ur POSITIVE (A) NEGATIVE  CBC with Differential/Platelet     Status: Abnormal   Collection Time: 01/21/16  1:51 AM  Result Value Ref Range   WBC 10.3 4.0 - 10.5 K/uL   RBC 4.28 3.87 - 5.11 MIL/uL   Hemoglobin 12.3 12.0 - 15.0 g/dL   HCT 16.137.2 09.636.0 - 04.546.0 %   MCV 86.9 78.0 - 100.0 fL   MCH 28.7 26.0 - 34.0 pg   MCHC 33.1 30.0 - 36.0 g/dL   RDW 40.914.1 81.111.5 - 91.415.5 %   Platelets 283 150 - 400 K/uL   Neutrophils Relative % 50 %   Neutro Abs 5.2 1.7 - 7.7 K/uL   Lymphocytes Relative 28 %   Lymphs Abs 2.9 0.7 - 4.0 K/uL   Monocytes Relative 4 %   Monocytes Absolute 0.4 0.1 - 1.0 K/uL   Eosinophils Relative 18 %   Eosinophils Absolute 1.8 (H) 0.0 - 0.7 K/uL   Basophils Relative 0 %   Basophils Absolute 0.0 0.0 - 0.1 K/uL  hCG, quantitative, pregnancy     Status: Abnormal   Collection Time: 01/21/16  1:51 AM  Result Value Ref Range   hCG, Beta Chain, Quant, S 2956 (H) <5 mIU/mL  Wet prep, genital     Status: Abnormal   Collection Time: 01/21/16  1:53 AM  Result Value Ref Range   Yeast Wet Prep HPF POC NONE SEEN NONE SEEN   Trich, Wet Prep NONE SEEN NONE SEEN   Clue Cells Wet Prep HPF POC PRESENT (A) NONE SEEN   WBC, Wet Prep HPF POC MANY (A) NONE SEEN   Sperm NONE SEEN    [place] Ob Comp Less 14 Wks  01/21/2016  CLINICAL DATA:  Abdominal pain. Estimated gestational age by LMP is 3 weeks 4 days. Quantitative beta HCG is pending. EXAM: OBSTETRIC <14 WK US AND TRANSVAGINAL OB US TECHNIQUE: Both  transabdominal and transvaginal ultrasound examinations were performed for complete evaluation of the gestation as well as the maternal uterus, adnexal regions, and pelvic cul-de-sac. Transvaginal technique was performed to assess early pregnancy. COMPARISON:  CT 09/23/2015 FINDINGS: Intrauterine gestational sac: A tiny intrauterine gestational sac is demonstrated. Yolk sac:  Not identified. Embryo:  Not identified. Cardiac Activity: Not identified. MSD: 4  mm   5 w   1  d Subchorionic hemorrhage:  None visualized. Maternal uterus/adnexae: Uterus is anteverted. Endometrium is prominent, likely decidual reaction. Minimal fluid in the endocervical canal. Both ovaries are identified and demonstrate normal follicular changes. No abnormal adnexal masses. No free pelvic fluid. IMPRESSION: Probable early intrauterine gestational sac, but no yolk sac, fetal pole, or cardiac activity yet visualized. Recommend follow-up quantitative B-HCG levels and follow-up US in 14 days to confirm and assess viability. This recommendation follows SRU consensus guidelines: Diagnostic Criteria for Nonviable Pregnancy Early in the First Trimester. Malva Limes Med 2013; 161:0960-45. Electronically Signed   By: Burman Nieves M.D.   On: 01/21/2016 02:47   US Ob Transvaginal  01/21/2016  CLINICAL DATA:  Abdominal pain. Estimated gestational age by LMP is 3 weeks 4 days. Quantitative beta HCG is pending. EXAM: OBSTETRIC <14 WK [place] AND TRANSVAGINAL OB US TECHNIQUE: Both transabdominal and transvaginal ultrasound examinations were performed for complete evaluation of the gestation as well as the maternal uterus, adnexal regions, and pelvic cul-de-sac. Transvaginal technique was performed to assess early pregnancy. COMPARISON:  CT 09/23/2015 FINDINGS: Intrauterine gestational sac: A tiny intrauterine gestational sac is demonstrated. Yolk sac:  Not identified. Embryo:  Not identified. Cardiac Activity: Not identified. MSD: 4  mm   5 w   1  d  Subchorionic hemorrhage:  None visualized. Maternal uterus/adnexae: Uterus is anteverted. Endometrium is prominent, likely decidual reaction. Minimal fluid in the endocervical canal. Both ovaries are identified and demonstrate normal follicular changes. No abnormal adnexal masses. No free pelvic fluid. IMPRESSION: Probable early intrauterine gestational sac, but no yolk sac, fetal pole, or cardiac activity yet visualized. Recommend follow-up quantitative B-HCG levels and follow-up US in 14 days to confirm and assess viability. This recommendation follows SRU consensus guidelines: Diagnostic Criteria for Nonviable Pregnancy Early in the First Trimester. Malva Limes Med 2013; 409:8119-14. Electronically Signed   By: Burman Nieves M.D.   On: 01/21/2016 02:47    MAU Course  Procedures None  MDM +UPT UA, wet prep, GC/chlamydia, CBC, quant hCG, HIV, RPR and [place] today to rule out ectopic pregnancy O+ blood type in Epic from previous visit  Assessment and Plan  A: IUGS at [redacted]w[redacted]d without YS or FP Pregnancy of unknown location Abdominal pain in pregnancy, first trimester Bacterial vaginosis  P: Discharge home Rx for Flagyl given to patient  Ectopic precautions discussed Patient advised to follow-up with [facility] on 01/23/16 at 11:00 am for repeat labs, may consider adding progesterone level at patient's request to labs drawn in the clinic Patient may return to MAU as needed or if her condition were to change or worsen   Marny Lowenstein, PA-C  01/21/2016, 3:09 AM

## 2016-01-21 NOTE — MAU Note (Signed)
Positive home preg test 2 days ago and she is having lower abd cramping.

## 2016-01-23 ENCOUNTER — Other Ambulatory Visit: Payer: Medicaid Other

## 2016-01-23 DIAGNOSIS — O3680X0 Pregnancy with inconclusive fetal viability, not applicable or unspecified: Secondary | ICD-10-CM

## 2016-01-23 LAB — HCG, QUANTITATIVE, PREGNANCY: HCG, BETA CHAIN, QUANT, S: 7587 m[IU]/mL — AB (ref <5)

## 2016-01-23 MED ORDER — PROGESTERONE MICRONIZED 200 MG PO CAPS: 200.0000 mg | ORAL_CAPSULE | Freq: Every day | ORAL | Status: DC

## 2016-01-23 NOTE — Progress Notes (Signed)
Pt in for repeat hcg level. Ultrasound showed pregnancy of unknown anatomic location. Todays hcg level is 7587. Reviewed with Dr. Macon LargeAnyanwu who stated that this is an appropriate rise in hcg. Pt will need a repeat ultrasound next week. Ultrasound scheduled for Monday at 1 pm. 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 Per Dr Macon LargeAnyanwu pt may start prometrium 200 mg per vagina nightly. Patient is agreeable to this.

## 2016-01-28 ENCOUNTER — Ambulatory Visit (HOSPITAL_COMMUNITY): Payer: Medicaid Other

## 2016-02-12 ENCOUNTER — Encounter (HOSPITAL_COMMUNITY): Payer: Self-pay

## 2016-02-12 ENCOUNTER — Inpatient Hospital Stay (HOSPITAL_COMMUNITY)
Admission: AD | Admit: 2016-02-12 | Discharge: 2016-02-12 | Disposition: A | Discharge status: Home / Self Care | Payer: Medicaid Other | Source: Ambulatory Visit | Attending: Obstetrics & Gynecology | Admitting: Obstetrics & Gynecology

## 2016-02-12 DIAGNOSIS — O219 Vomiting of pregnancy, unspecified: Secondary | ICD-10-CM

## 2016-02-12 DIAGNOSIS — F419 Anxiety disorder, unspecified: Secondary | ICD-10-CM | POA: Diagnosis not present

## 2016-02-12 DIAGNOSIS — R112 Nausea with vomiting, unspecified: Secondary | ICD-10-CM | POA: Insufficient documentation

## 2016-02-12 DIAGNOSIS — Z3A08 8 weeks gestation of pregnancy: Secondary | ICD-10-CM | POA: Diagnosis not present

## 2016-02-12 DIAGNOSIS — O26891 Other specified pregnancy related conditions, first trimester: Secondary | ICD-10-CM | POA: Insufficient documentation

## 2016-02-12 DIAGNOSIS — O99331 Smoking (tobacco) complicating pregnancy, first trimester: Secondary | ICD-10-CM | POA: Insufficient documentation

## 2016-02-12 DIAGNOSIS — K219 Gastro-esophageal reflux disease without esophagitis: Secondary | ICD-10-CM | POA: Diagnosis not present

## 2016-02-12 DIAGNOSIS — O218 Other vomiting complicating pregnancy: Secondary | ICD-10-CM | POA: Diagnosis not present

## 2016-02-12 LAB — URINALYSIS, ROUTINE W REFLEX MICROSCOPIC
Bilirubin Urine: NEGATIVE
GLUCOSE, UA: NEGATIVE mg/dL
Ketones, ur: NEGATIVE mg/dL
LEUKOCYTES UA: NEGATIVE
NITRITE: NEGATIVE
Protein, ur: NEGATIVE mg/dL
SPECIFIC GRAVITY, URINE: 1.01 (ref 1.005–1.030)
pH: 7 (ref 5.0–8.0)

## 2016-02-12 LAB — URINE MICROSCOPIC-ADD ON: WBC, UA: NONE SEEN WBC/hpf (ref 0–5)

## 2016-02-12 MED ORDER — PROMETHAZINE HCL 25 MG PO TABS: 25.0000 mg | ORAL_TABLET | Freq: Four times a day (QID) | ORAL | Status: DC | PRN

## 2016-02-12 MED ORDER — PANTOPRAZOLE SODIUM 40 MG PO TBEC
40.0000 mg | DELAYED_RELEASE_TABLET | Freq: Once | ORAL | Status: AC
  Administered 2016-02-12: 40 mg via ORAL
  Filled 2016-02-12: qty 1

## 2016-02-12 MED ORDER — METRONIDAZOLE 0.75 % VA GEL: 1.0000 | Freq: Two times a day (BID) | VAGINAL | Status: AC

## 2016-02-12 MED ORDER — METOCLOPRAMIDE HCL 10 MG PO TABS
10.0000 mg | ORAL_TABLET | Freq: Once | ORAL | Status: AC
  Administered 2016-02-12: 10 mg via ORAL
  Filled 2016-02-12: qty 1

## 2016-02-12 NOTE — MAU Provider Note (Signed)
History     CSN: [id number]  Arrival date and time: 02/12/16 4098   First Provider Initiated Contact with Patient 02/12/16 [phone]        Chief Complaint  Patient presents with  . Emesis During Pregnancy   HPI  Tonya Yates is a 30 y.o. [phone] at [redacted]w[redacted]d who presents with nausea & vomiting. Symptoms began yesterday. Reports vomiting 8 times in the last 24 hours. States unable to keep down food or liquids. Last tried to eat last night, salad, unable to tolerate. Does not have antiemetic at home. Reports some epigastric tenderness, worsened with vomiting. Denies abdominal pain, vaginal bleeding, or diarrhea. Plans on getting care with [facility]; hasn't schedule initial ob yet. Requesting ultrasound today as she missed her outpatient ultrasound for viability.    OB History    Gravida Para Term Preterm AB TAB SAB Ectopic Multiple Living   Past Medical History  Diagnosis Date  . Acid reflux   . Hypoglycemia   . Anxiety   . Depression     Past Surgical History  Procedure Laterality Date  . Eye surgery      History reviewed. No pertinent family history.  Social History  Substance Use Topics  . Smoking status: Light Tobacco Smoker  . Smokeless tobacco: Never Used  . Alcohol Use: Yes     Comment: Patient reports she drinks alcohol 1x a week    Allergies: No Known Allergies  Prescriptions prior to admission  Medication Sig Dispense Refill Last Dose  . loratadine (CLARITIN) 10 MG tablet Take 10 mg by mouth daily.   Past Week at Unknown time  . metroNIDAZOLE (FLAGYL) 500 MG tablet Take 1 tablet (500 mg total) by mouth 2 (two) times daily. 14 tablet 0   . progesterone (PROMETRIUM) 200 MG capsule Place 1 capsule (200 mg total) vaginally daily. 30 capsule 2     Review of Systems  Constitutional: Negative.   Gastrointestinal: Positive for heartburn, nausea and vomiting. Negative for abdominal pain, diarrhea and constipation.  Genitourinary: Negative.     Physical Exam   Blood pressure 115/68, pulse 64, temperature 98.2 F (36.8 C), temperature source Oral, resp. rate 18, height  (1.549 m), weight 169 lb (76.658 kg), last menstrual period 12/27/2015.  Physical Exam  Nursing note and vitals reviewed. Constitutional: She is oriented to person, place, and time. She appears well-developed and well-nourished. No distress.  HENT:  Head: Normocephalic and atraumatic.  Eyes: Conjunctivae are normal. Right eye exhibits no discharge. Left eye exhibits no discharge. No scleral icterus.  Neck: Normal range of motion.  Cardiovascular: Normal rate, regular rhythm and normal heart sounds.   No murmur heard. Respiratory: Effort normal and breath sounds normal. No respiratory distress. She has no wheezes.  GI: Soft. Bowel sounds are normal. She exhibits no distension. There is no tenderness. There is no rebound and no guarding.  Neurological: She is alert and oriented to person, place, and time.  Skin: Skin is warm and dry. She is not diaphoretic.  Psychiatric: She has a normal mood and affect. Her behavior is normal. Judgment and thought content normal.    MAU Course  Procedures Results for orders placed or performed during the hospital encounter of 02/12/16 (from the past 24 hour(s))  Urinalysis, Routine w reflex microscopic (not at [facility])     Status: Abnormal   Collection Time: 02/12/16  8:36 AM  Result Value Ref  Range   Color, Urine YELLOW YELLOW   APPearance CLEAR CLEAR   Specific Gravity, Urine 1.010 1.005 - 1.030   pH 7.0 5.0 - 8.0   Glucose, UA NEGATIVE NEGATIVE mg/dL   Hgb urine dipstick TRACE (A) NEGATIVE   Bilirubin Urine NEGATIVE NEGATIVE   Ketones, ur NEGATIVE NEGATIVE mg/dL   Protein, ur NEGATIVE NEGATIVE mg/dL   Nitrite NEGATIVE NEGATIVE   Leukocytes, UA NEGATIVE NEGATIVE  Urine microscopic-add on     Status: Abnormal   Collection Time: 02/12/16  8:36 AM  Result Value Ref Range   Squamous Epithelial / LPF 0-5 (A) NONE  SEEN   WBC, UA NONE SEEN 0 - 5 WBC/hpf   RBC / HPF 0-5 0 - 5 RBC/hpf   Bacteria, UA RARE (A) NONE SEEN   Urine-Other MUCOUS PRESENT     MDM No ketones in urine Reglan 10 mg PO Protonix 40 mg PO Not observed vomiting in MAU Pt reports improvement in symptoms since meds Will reorder outpatient ultrasound for viability - previous ultrasound showed IUGS, no yolk sac; pt's f/u in clinic showed appropriate rises in BHCG - pt denies abdominal pain or vaginal bleeding.  Pt requesting metrogel b/c she can't tolerate the flagyl she was previously prescribed for BV  Assessment and Plan  A: 1. Nausea and vomiting during pregnancy prior to [redacted] weeks gestation     P; Discharge home Rx metrogel Rx phenergan Discussed reasons to return to MAU Call [facility] to schedule initial prenatal visit Outpatient ultrasound ordered  Judeth Hornrin Vineet Kinney 02/12/2016, 9:01 AM

## 2016-02-12 NOTE — MAU Note (Signed)
Has thrown up 4 times today and kept waking up last night to throw up.

## 2016-02-12 NOTE — Discharge Instructions (Signed)
Eating Plan for Hyperemesis Gravidarum °Severe cases of hyperemesis gravidarum can lead to dehydration and malnutrition. The hyperemesis eating plan is one way to lessen the symptoms of nausea and vomiting. It is often used with prescribed medicines to control your symptoms.  °WHAT CAN I DO TO RELIEVE MY SYMPTOMS? °Listen to your body. Everyone is different and has different preferences. Find what works best for you. Some of the following things may help: °· Eat and drink slowly. °· Eat 5-6 small meals daily instead of 3 large meals.   °· Eat crackers before you get out of bed in the morning.   °· Starchy foods are usually well tolerated (such as cereal, toast, bread, potatoes, pasta, rice, and pretzels).   °· Ginger may help with nausea. Add ¼ tsp ground ginger to hot tea or choose ginger tea.   °· Try drinking 100% fruit juice or an electrolyte drink. °· Continue to take your prenatal vitamins as directed by your health care provider. If you are having trouble taking your prenatal vitamins, talk with your health care provider about different options. °· Include at least 1 serving of protein with your meals and snacks (such as meats or poultry, beans, nuts, eggs, or yogurt). Try eating a protein-rich snack before bed (such as cheese and crackers or a half turkey or peanut butter sandwich). °WHAT THINGS SHOULD I AVOID TO REDUCE MY SYMPTOMS? °The following things may help reduce your symptoms: °· Avoid foods with strong smells. Try eating meals in well-ventilated areas that are free of odors. °· Avoid drinking water or other beverages with meals. Try not to drink anything less than 30 minutes before and after meals. °· Avoid drinking more than 1 cup of fluid at a time. °· Avoid fried or high-fat foods, such as butter and cream sauces. °· Avoid spicy foods. °· Avoid skipping meals the best you can. Nausea can be more intense on an empty stomach. If you cannot tolerate food at that time, do not force it. Try sucking on  ice chips or other frozen items and make up the calories later. °· Avoid lying down within 2 hours after eating. °  °This information is not intended to replace advice given to you by your health care provider. Make sure you discuss any questions you have with your health care provider. °  °Document Released: 07/20/2007 Document Revised: 09/27/2013 Document Reviewed: 07/27/2013 °Elsevier Interactive Patient Education ©2016 Elsevier Inc. ° °

## 2016-02-19 ENCOUNTER — Ambulatory Visit (HOSPITAL_COMMUNITY): Payer: Medicaid Other

## 2016-02-19 ENCOUNTER — Ambulatory Visit (HOSPITAL_COMMUNITY)
Admission: RE | Admit: 2016-02-19 | Discharge: 2016-02-19 | Disposition: A | Discharge status: Home / Self Care | Payer: Medicaid Other | Source: Ambulatory Visit | Attending: Obstetrics & Gynecology | Admitting: Obstetrics & Gynecology

## 2016-02-19 DIAGNOSIS — Z3A09 9 weeks gestation of pregnancy: Secondary | ICD-10-CM | POA: Insufficient documentation

## 2016-02-19 DIAGNOSIS — Z36 Encounter for antenatal screening of mother: Secondary | ICD-10-CM | POA: Diagnosis present

## 2016-02-19 DIAGNOSIS — O3680X0 Pregnancy with inconclusive fetal viability, not applicable or unspecified: Secondary | ICD-10-CM

## 2016-03-04 IMAGING — US US OB LIMITED
1 series · 14 of 28 positions shown · non-contrast
Comparison: 02/27/2015

CLINICAL DATA: Vaginal bleeding and cramping

EXAM:
OBSTETRIC <14 WK US AND TRANSVAGINAL OB US
TECHNIQUE: Both transabdominal and transvaginal ultrasound examinations were
performed for complete evaluation of the gestation as well as the
maternal uterus, adnexal regions, and pelvic cul-de-sac.
Transvaginal technique was performed to assess early pregnancy.

[Series 1: us ob limited · 0.22mm/px · 41 acquisitions, 14 frames shown]
[im 2/41]
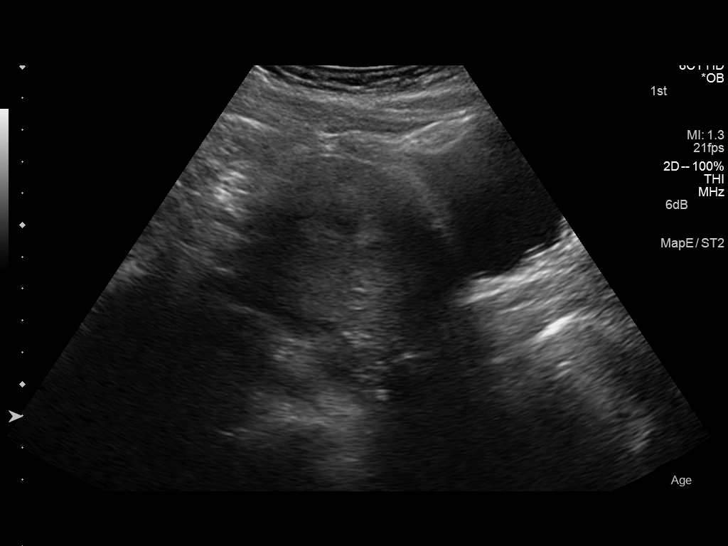
[im 5/41]
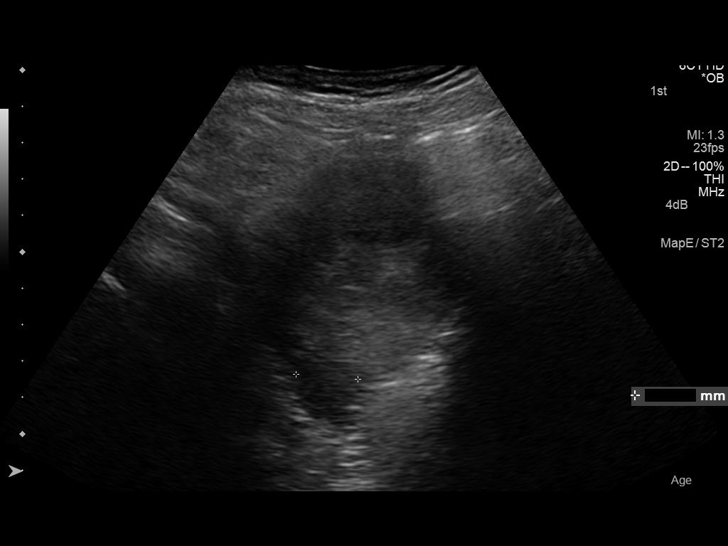
[im 8/41]
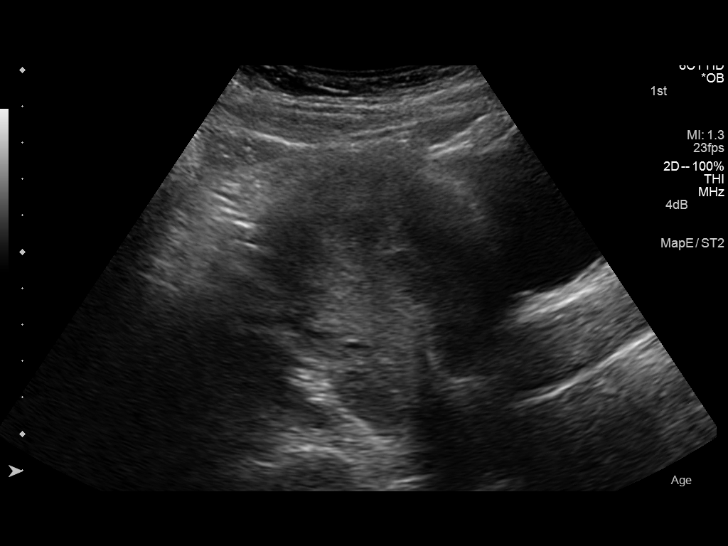
[im 11/41]
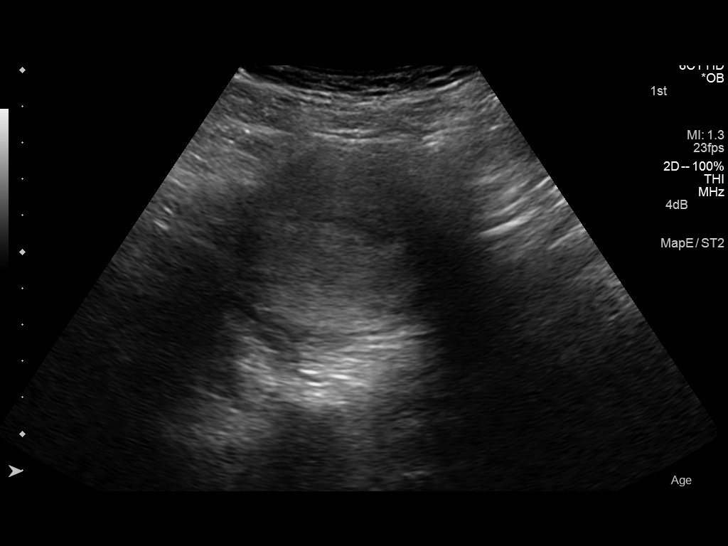
[im 14/41]
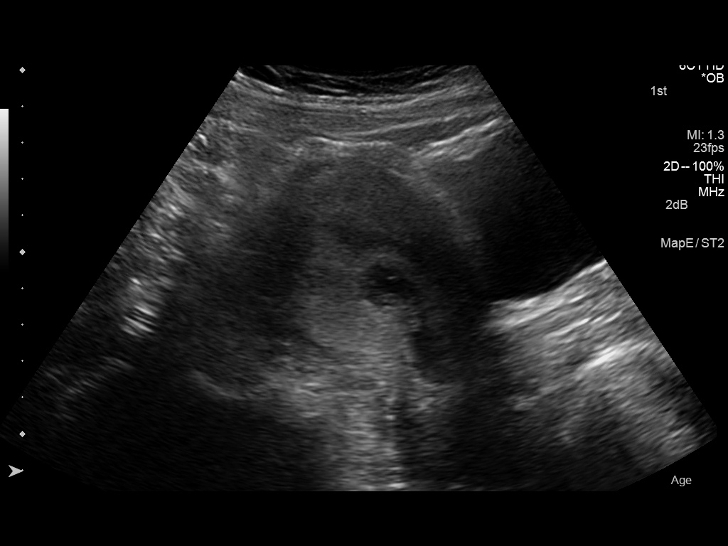
[im 17/41]
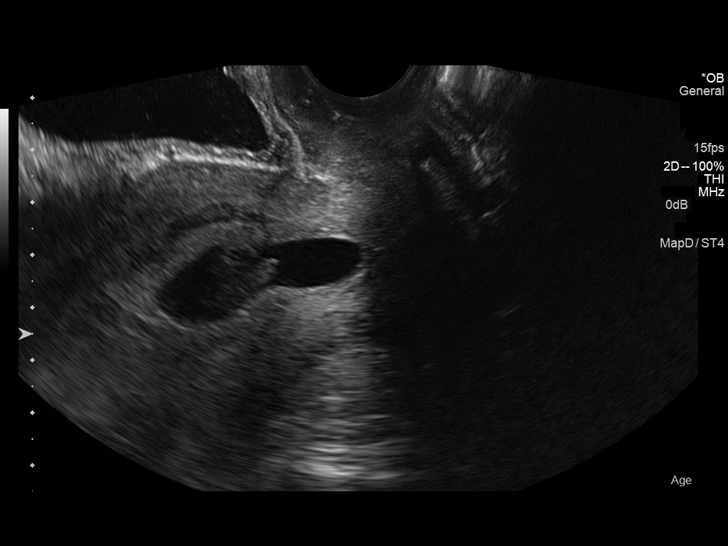
[im 20/41]
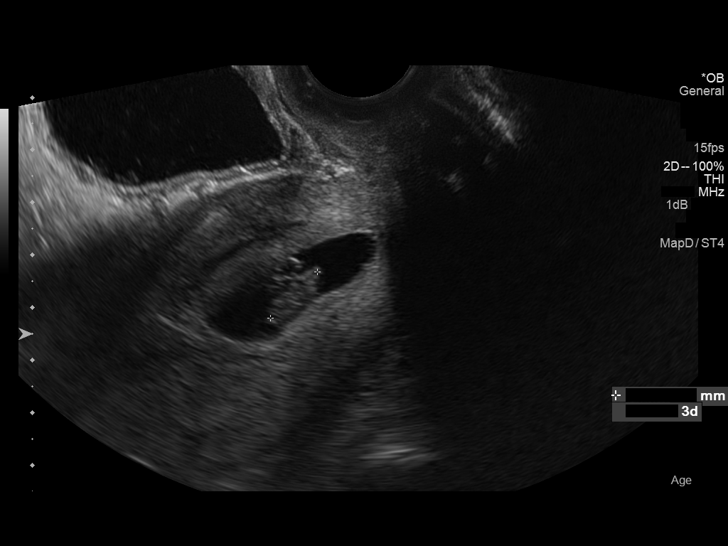
[im 23/41]
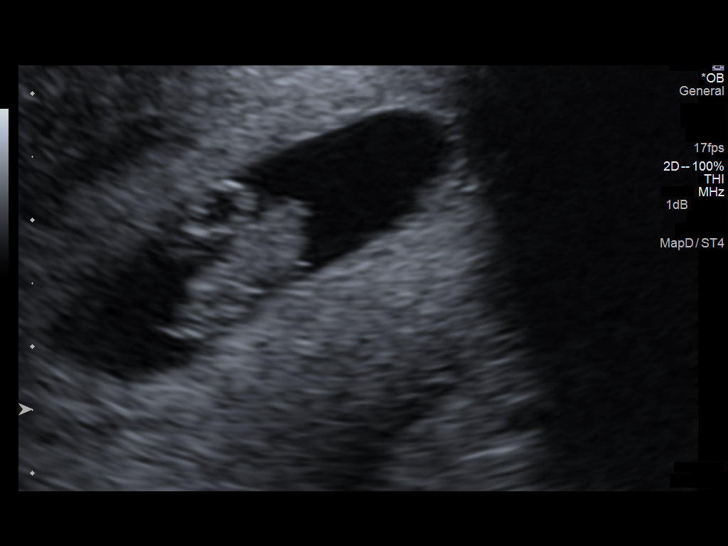
[im 26/41]
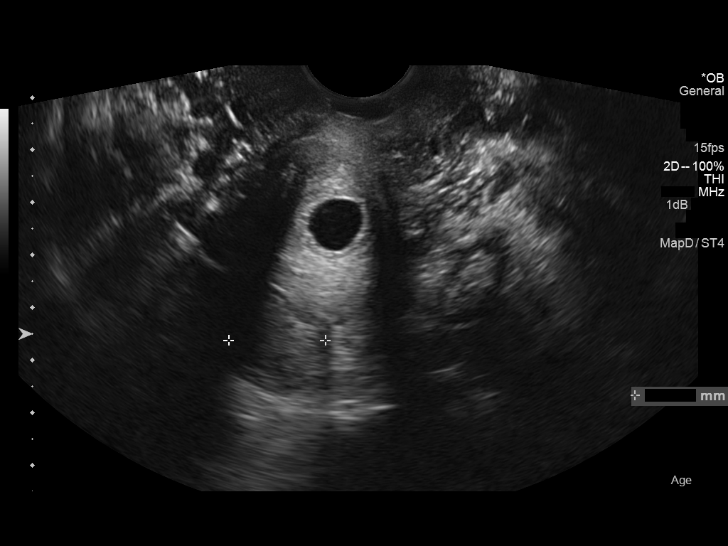
[im 29/41]
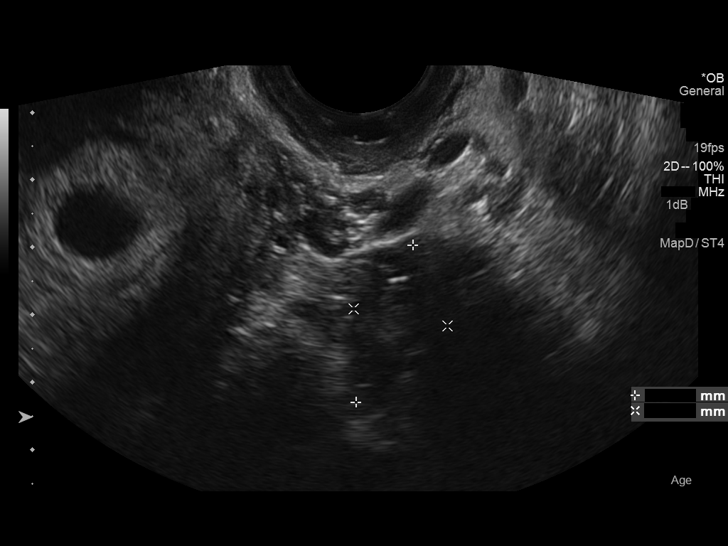
[im 32/41]
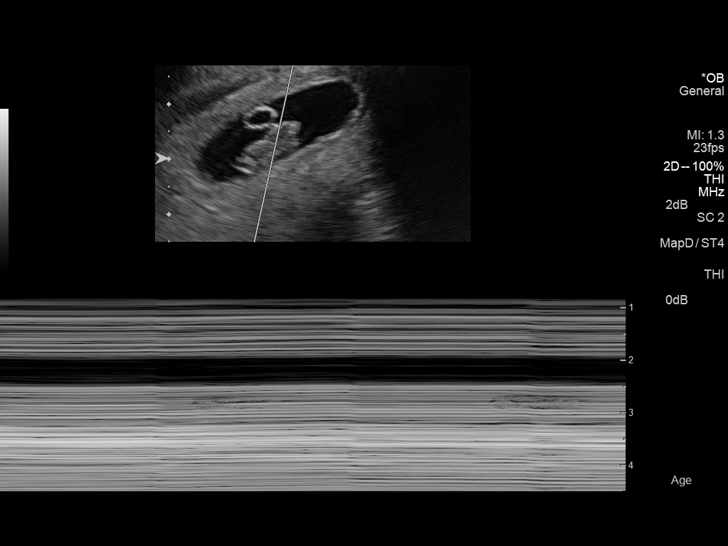
[im 35/41]
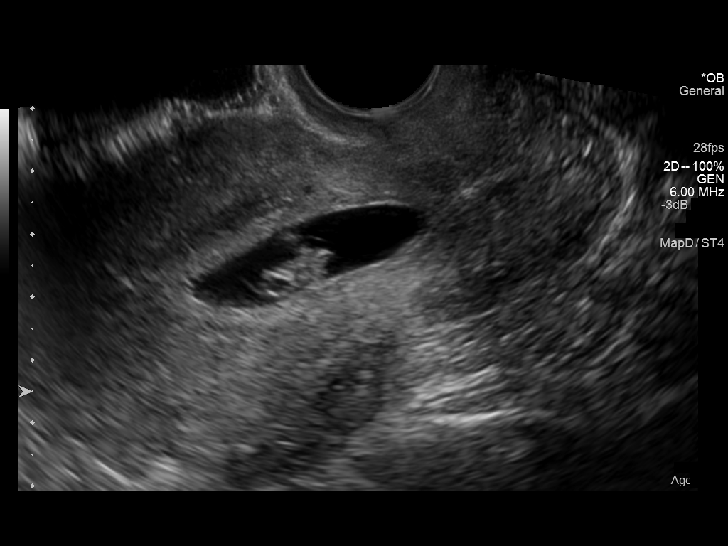
[im 38/41]
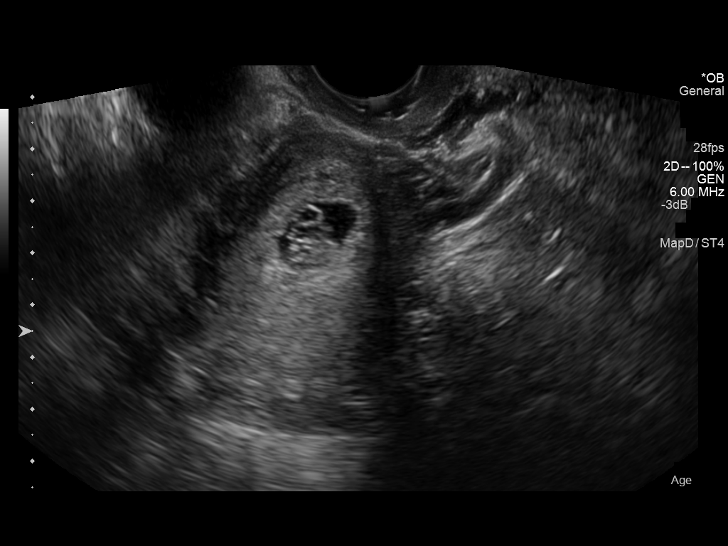
[im 41/41]
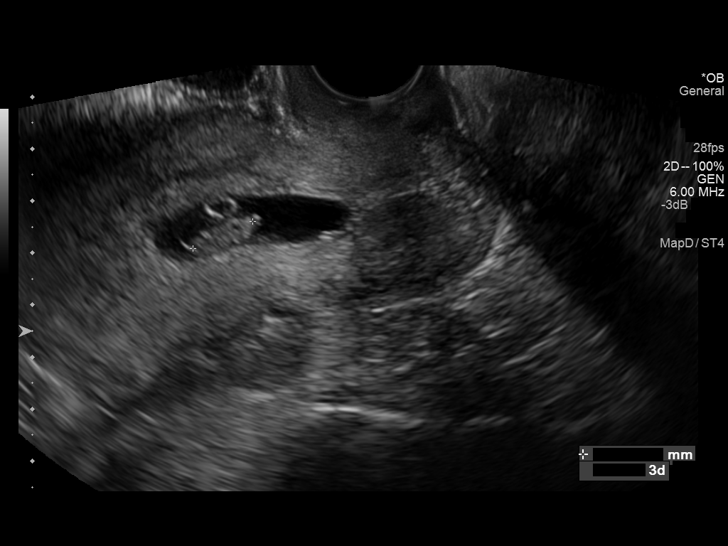

[14 of 28 positions shown; findings below may reference images not displayed]

FINDINGS: Intrauterine gestational sac: Visualized/normal in shape. There is
again noted a subchorionic hemorrhage.

Yolk sac:  Present

Embryo:  Present

Cardiac Activity: Difficult to obtain but present

Heart Rate: 56  bpm

CRL:  12.7  mm   7 w   3 d                  US EDC: 10/20/2015

Maternal uterus/adnexae: Within normal limits aside from the
subchorionic hemorrhage.
IMPRESSION: Single live intrauterine gestation as described. There has been
adequate size progression when compared with the prior study. A
subchorionic hemorrhage is again seen. The cardiac activity and
heartbeat appear less than that seen on the prior exam. Clinical
followup is recommended.

## 2016-03-12 ENCOUNTER — Encounter: Payer: Self-pay | Admitting: Advanced Practice Midwife

## 2016-04-03 ENCOUNTER — Encounter (HOSPITAL_COMMUNITY): Payer: Self-pay | Admitting: Advanced Practice Midwife

## 2016-04-03 ENCOUNTER — Inpatient Hospital Stay (HOSPITAL_COMMUNITY)
Admission: AD | Admit: 2016-04-03 | Discharge: 2016-04-03 | Disposition: A | Discharge status: Home / Self Care | Payer: Medicaid Other | Source: Ambulatory Visit | Attending: Obstetrics and Gynecology | Admitting: Obstetrics and Gynecology

## 2016-04-03 DIAGNOSIS — F419 Anxiety disorder, unspecified: Secondary | ICD-10-CM | POA: Diagnosis not present

## 2016-04-03 DIAGNOSIS — O23592 Infection of other part of genital tract in pregnancy, second trimester: Secondary | ICD-10-CM | POA: Insufficient documentation

## 2016-04-03 DIAGNOSIS — O99332 Smoking (tobacco) complicating pregnancy, second trimester: Secondary | ICD-10-CM | POA: Insufficient documentation

## 2016-04-03 DIAGNOSIS — Z3A15 15 weeks gestation of pregnancy: Secondary | ICD-10-CM | POA: Diagnosis not present

## 2016-04-03 DIAGNOSIS — N949 Unspecified condition associated with female genital organs and menstrual cycle: Secondary | ICD-10-CM

## 2016-04-03 DIAGNOSIS — K219 Gastro-esophageal reflux disease without esophagitis: Secondary | ICD-10-CM | POA: Diagnosis not present

## 2016-04-03 DIAGNOSIS — R102 Pelvic and perineal pain: Secondary | ICD-10-CM | POA: Diagnosis present

## 2016-04-03 DIAGNOSIS — B9689 Other specified bacterial agents as the cause of diseases classified elsewhere: Secondary | ICD-10-CM | POA: Insufficient documentation

## 2016-04-03 DIAGNOSIS — O99612 Diseases of the digestive system complicating pregnancy, second trimester: Secondary | ICD-10-CM | POA: Diagnosis not present

## 2016-04-03 DIAGNOSIS — N760 Acute vaginitis: Secondary | ICD-10-CM | POA: Diagnosis not present

## 2016-04-03 DIAGNOSIS — F329 Major depressive disorder, single episode, unspecified: Secondary | ICD-10-CM | POA: Diagnosis not present

## 2016-04-03 DIAGNOSIS — O99342 Other mental disorders complicating pregnancy, second trimester: Secondary | ICD-10-CM | POA: Diagnosis not present

## 2016-04-03 DIAGNOSIS — O26892 Other specified pregnancy related conditions, second trimester: Secondary | ICD-10-CM | POA: Insufficient documentation

## 2016-04-03 LAB — URINE MICROSCOPIC-ADD ON

## 2016-04-03 LAB — WET PREP, GENITAL
SPERM: NONE SEEN
Trich, Wet Prep: NONE SEEN
YEAST WET PREP: NONE SEEN

## 2016-04-03 LAB — URINALYSIS, ROUTINE W REFLEX MICROSCOPIC
Bilirubin Urine: NEGATIVE
GLUCOSE, UA: NEGATIVE mg/dL
Ketones, ur: NEGATIVE mg/dL
LEUKOCYTES UA: NEGATIVE
Nitrite: NEGATIVE
PROTEIN: NEGATIVE mg/dL
SPECIFIC GRAVITY, URINE: 1.015 (ref 1.005–1.030)
pH: 7 (ref 5.0–8.0)

## 2016-04-03 LAB — GC/CHLAMYDIA PROBE AMP (~~LOC~~) NOT AT ARMC
Chlamydia: NEGATIVE
Neisseria Gonorrhea: NEGATIVE

## 2016-04-03 MED ORDER — METRONIDAZOLE 500 MG PO TABS: 500.0000 mg | ORAL_TABLET | Freq: Two times a day (BID) | ORAL | Status: DC

## 2016-04-03 NOTE — MAU Note (Signed)
Patient is here with c/o cramping that started 3 days ago. Patient denies any bleeding

## 2016-04-03 NOTE — MAU Provider Note (Signed)
History     CSN: [id number]  Arrival date and time: 04/03/16 [id number]   First Provider Initiated Contact with Patient 04/03/16 0252      Chief Complaint  Patient presents with  . Pelvic Pain   Pelvic Pain The patient's primary symptoms include pelvic pain. This is a new problem. The current episode started 1 to 4 weeks ago (but worse in the last 2 days ). The problem has been gradually worsening. Pain severity now: 7/10  The problem affects both sides. She is pregnant. Associated symptoms include abdominal pain, nausea and vomiting. Pertinent negatives include no chills, constipation, diarrhea, dysuria, fever, frequency or urgency. The vaginal discharge was normal. There has been no bleeding. The symptoms are aggravated by intercourse (vomiting ). She has tried nothing for the symptoms.    Past Medical History  Diagnosis Date  . Acid reflux   . Hypoglycemia   . Anxiety   . Depression     Past Surgical History  Procedure Laterality Date  . Eye surgery      History reviewed. No pertinent family history.  Social History  Substance Use Topics  . Smoking status: Light Tobacco Smoker  . Smokeless tobacco: Never Used  . Alcohol Use: Yes     Comment: Patient reports she drinks alcohol 1x a week    Allergies: No Known Allergies  Prescriptions prior to admission  Medication Sig Dispense Refill Last Dose  . promethazine (PHENERGAN) 25 MG tablet Take 1 tablet (25 mg total) by mouth every 6 (six) hours as needed for nausea or vomiting. 30 tablet 0     Review of Systems  Constitutional: Negative for fever and chills.  Gastrointestinal: Positive for nausea, vomiting and abdominal pain. Negative for diarrhea and constipation.  Genitourinary: Positive for pelvic pain. Negative for dysuria, urgency and frequency.   Physical Exam   Blood pressure 122/63, pulse 62, temperature 98.7 F (37.1 C), temperature source Oral, resp. rate 18, last menstrual period 12/27/2015.  Physical Exam   Nursing note and vitals reviewed. Constitutional: She is oriented to person, place, and time. She appears well-developed and well-nourished. No distress.  HENT:  Head: Normocephalic.  Cardiovascular: Normal rate.   Respiratory: Effort normal.  GI: Soft. There is no tenderness. There is no rebound.  Genitourinary:  Fundus: AGA, FHT 162 with doppler  Cervix: closed/thick  Neurological: She is alert and oriented to person, place, and time.  Skin: Skin is warm and dry.  Psychiatric: She has a normal mood and affect.   Results for orders placed or performed during the hospital encounter of 04/03/16 (from the past 24 hour(s))  Urinalysis, Routine w reflex microscopic (not at [facility])     Status: Abnormal   Collection Time: 04/03/16  2:34 AM  Result Value Ref Range   Color, Urine YELLOW YELLOW   APPearance CLEAR CLEAR   Specific Gravity, Urine 1.015 1.005 - 1.030   pH 7.0 5.0 - 8.0   Glucose, UA NEGATIVE NEGATIVE mg/dL   Hgb urine dipstick TRACE (A) NEGATIVE   Bilirubin Urine NEGATIVE NEGATIVE   Ketones, ur NEGATIVE NEGATIVE mg/dL   Protein, ur NEGATIVE NEGATIVE mg/dL   Nitrite NEGATIVE NEGATIVE   Leukocytes, UA NEGATIVE NEGATIVE  Urine microscopic-add on     Status: Abnormal   Collection Time: 04/03/16  2:34 AM  Result Value Ref Range   Squamous Epithelial / LPF 0-5 (A) NONE SEEN   WBC, UA 0-5 0 - 5 WBC/hpf   RBC / HPF 0-5 0 - 5  RBC/hpf   Bacteria, UA RARE (A) NONE SEEN   Urine-Other AMORPHOUS URATES/PHOSPHATES   Wet prep, genital     Status: Abnormal   Collection Time: 04/03/16  2:56 AM  Result Value Ref Range   Yeast Wet Prep HPF POC NONE SEEN NONE SEEN   Trich, Wet Prep NONE SEEN NONE SEEN   Clue Cells Wet Prep HPF POC PRESENT (A) NONE SEEN   WBC, Wet Prep HPF POC FEW (A) NONE SEEN   Sperm NONE SEEN      MAU Course  Procedures  MDM   Assessment and Plan   1. Round ligament pain   2. [redacted] weeks gestation of pregnancy   3. BV (bacterial vaginosis)    DC  home Comfort measures reviewed  2nd Trimester precautions  Bleeding precautions PTL precautions  Fetal kick counts RX: flagyl 500mg  BID x 7 days  Return to MAU as needed  Follow-up Information    [facility] with [facility]   Specialty:  Obstetrics and Gynecology   Why:  They will call you with an appointment   Contact information:   [address] [place] [id number] [phone]        Tawnya CrookHogan, Heather Donovan 04/03/2016, 2:54 AM

## 2016-04-03 NOTE — Discharge Instructions (Signed)
Round Ligament Pain during Pregnancy Many women will experience a type of pain referred to as round ligament pain during their pregnancy. This is associated with abdominal pain or discomfort. Since any type of abdominal pain during pregnancy can be disconcerting, it is important to talk about round ligament pain to relieve any anxiety or fears you may have regarding the symptoms you are feeling. Round ligament pain is due to normal changes that take place in the body during pregnancy. It is caused by stretching of the round ligaments attached to the uterus. More commonly it occurs on the right side of the pelvis. Round Ligament: An Overview Typically in the non-pregnant state the uterus is about the size of an apple or pear. There are thick ligaments which hold the uterus in place in the abdomen, referred to as round ligaments. During pregnancy, your uterus will expand in size and weight, and the ligaments supporting it will have to stretch, becoming longer and thinner. As these ligaments pull and tug they may irritate nearby nerve fibers, which causes pain. The severity of the pain in some cases can seem extreme. Some common symptoms of round ligament pain include:  Ligament spasms or contractions/cramps that trigger a sharp pain typically on the right side of the abdomen.  Pain upon waking or suddenly rolling over in your sleep.  Pain in the abdomen that is sharp brought on by exercise or other vigorous activity. Similar Problems Round ligament pain is often mistaken for other medical conditions because the symptoms are similar. Acute abdominal pain during pregnancy may also be a sign of other conditions including:  Abdominal cramps - Some abdominal pain is simply caused by change in bowel habits associated with pregnancy. Gas is a common problem that can cause sharp, shooting pain. You should always seek out medical care if your pain is accompanied by fever, chills, pain  upon urination or if you have difficulty walking. Further exams and tests will be conducted to ensure that you do not have a more serious condition. It is not uncommon for women with lower abdominal pain to have a urinary tract infection, thus you may also be asked for a urine sample. Treatment If all other conditions are ruled out you can treat your round ligament pain relatively easily. You may be advised to take some acetaminophen (Tylenol) to reduce the severity of any persistent pain and asked to reduce your activity level. You can apply a heating pad to the area of pain or take a warm bath. Lying on the opposite side of the pain may help as well. Most women will find relief from round ligament pain simply by altering their daily routines slightly. The good news is round ligament pain will disappear completely once you have given birth to your child!   Bacterial Vaginosis Bacterial vaginosis is an infection of the vagina. It happens when too many germs (bacteria) grow in the vagina. Having this infection puts you at risk for getting other infections from sex. Treating this infection can help lower your risk for other infections, such as:   Chlamydia.  Gonorrhea.  HIV.  Herpes. HOME CARE  Take your medicine as told by your doctor.  Finish your medicine even if you start to feel better.  Tell your sex partner that you have an infection. They should see their doctor for treatment.  During treatment:  Avoid sex or use condoms correctly.  Do not douche.  Do not drink alcohol unless your doctor tells you it is  ok.  Do not breastfeed unless your doctor tells you it is ok. GET HELP IF:  You are not getting better after 3 days of treatment.  You have more grey fluid (discharge) coming from your vagina than before.  You have more pain than before.  You have a fever. MAKE SURE YOU:   Understand these instructions.  Will watch your condition.  Will get help right away  if you are not doing well or get worse.   This information is not intended to replace advice given to you by your health care provider. Make sure you discuss any questions you have with your health care provider.   Document Released: 07/01/2008 Document Revised: 10/13/2014 Document Reviewed: 05/04/2013 Elsevier Interactive Patient Education [employer]2016 Elsevier Inc.

## 2016-04-15 ENCOUNTER — Ambulatory Visit (INDEPENDENT_AMBULATORY_CARE_PROVIDER_SITE_OTHER): Payer: Self-pay | Admitting: Clinical

## 2016-04-15 ENCOUNTER — Encounter: Payer: Self-pay | Admitting: *Deleted

## 2016-04-15 DIAGNOSIS — F329 Major depressive disorder, single episode, unspecified: Secondary | ICD-10-CM

## 2016-04-15 DIAGNOSIS — F32A Depression, unspecified: Secondary | ICD-10-CM

## 2016-04-15 DIAGNOSIS — F332 Major depressive disorder, recurrent severe without psychotic features: Secondary | ICD-10-CM

## 2016-04-15 MED ORDER — HYDROXYZINE HCL 25 MG PO TABS: 25.0000 mg | ORAL_TABLET | Freq: Four times a day (QID) | ORAL | Status: DC | PRN

## 2016-04-15 NOTE — Progress Notes (Signed)
  ASSESSMENT: Pt currently experiencing Major depressive disorder, recurrent episode, severe, without psychotic features. Pt needs [facility]. Pt would benefit from psychoeducation and therapeutic interventions. Pt may benefit from psychiatry and continued therapy. Stage of Change: contemplative  PLAN: 1. F/U with behavioral health clinician in one week, or as needed 2. Psychiatric Medications: Atarax, begin today 3. Behavioral recommendations:   - Take at least 10-20 minute nap daily -Consider reading educational material regarding coping with symptoms of depression   4. Referral: Will further discuss psychiatry at next visit  SUBJECTIVE: Pt. referred by Dr Macon LargeAnyanwu for symptoms of depression and anxiety Pt. reports the following symptoms/concerns: Pt states that she has felt an increase in depressive symptoms in past two weeks, since [facility] left. Pt says she is crying daily, and that she is unable to sleep well because of overwhelming sadness. Pt states that has been one year since her best friend passed away, and that although she had SI one year ago, that she does not feel suicidal today; no SI since one year prior. She looks forward to move to [place], with very supportive mother and sister, along with many friends, but will be unable to move until mid-September. Pt looks forward to new baby and to see her children, who are being cared for by grandparents this summer. Pt will think about psychiatry, after speaking to OB this coming week; primarily wants to be able to sleep to cope.  Duration of problem: Two weeks Severity: severe   OBJECTIVE: Orientation & Cognition: Oriented x3. Thought processes normal and appropriate to situation. Mood: teary Affect: appropriate Appearance: appropriate Risk of harm to self or others: no risk of harm to self today; no known risk of harm to others Substance use: none Assessments administered: PHQ9: 18/ GAD7: 15  Diagnosis: Major depressive  disorder, recurrent, severe, without psychotic features CPT Code: F33.2  -------------------------------------------- Other(s) present in the room: none  Time spent with patient in exam room: 60 minutes, 1:10 to 2:10pm   [facility] 04/15/2016  Decreased Interest 0  Down, Depressed, Hopeless 3  PHQ - 2 Score 3  Altered sleeping 3  Tired, decreased energy 3  Change in appetite 3  Feeling bad or failure about yourself  3  Trouble concentrating 0  Moving slowly or fidgety/restless 3  Suicidal thoughts 0  PHQ-9 Score 18  Difficult doing work/chores Very difficult    GAD 7 : Generalized Anxiety Score 04/15/2016  Nervous, Anxious, on Edge 3  Control/stop worrying 3  Worry too much - different things 3  Trouble relaxing 2  Restless 0  Easily annoyed or irritable 1  Afraid - awful might happen 3  Total GAD 7 Score 15  Anxiety Difficulty Somewhat difficult

## 2016-04-17 ENCOUNTER — Encounter: Payer: Self-pay | Admitting: Advanced Practice Midwife

## 2016-04-17 ENCOUNTER — Ambulatory Visit (INDEPENDENT_AMBULATORY_CARE_PROVIDER_SITE_OTHER): Payer: Medicaid Other | Admitting: Family Medicine

## 2016-04-17 ENCOUNTER — Encounter: Payer: Self-pay | Admitting: Family Medicine

## 2016-04-17 ENCOUNTER — Ambulatory Visit (INDEPENDENT_AMBULATORY_CARE_PROVIDER_SITE_OTHER): Payer: Self-pay | Admitting: Clinical

## 2016-04-17 VITALS — BP 116/61 | HR 74 | Wt 166.6 lb

## 2016-04-17 DIAGNOSIS — Z3480 Encounter for supervision of other normal pregnancy, unspecified trimester: Secondary | ICD-10-CM | POA: Insufficient documentation

## 2016-04-17 DIAGNOSIS — Z3482 Encounter for supervision of other normal pregnancy, second trimester: Secondary | ICD-10-CM

## 2016-04-17 DIAGNOSIS — F3341 Major depressive disorder, recurrent, in partial remission: Secondary | ICD-10-CM

## 2016-04-17 DIAGNOSIS — O2342 Unspecified infection of urinary tract in pregnancy, second trimester: Secondary | ICD-10-CM

## 2016-04-17 LAB — POCT URINALYSIS DIP (DEVICE)
Bilirubin Urine: NEGATIVE
Glucose, UA: NEGATIVE mg/dL
KETONES UR: NEGATIVE mg/dL
Leukocytes, UA: NEGATIVE
Nitrite: POSITIVE — AB
PH: 6 (ref 5.0–8.0)
PROTEIN: 30 mg/dL — AB
Specific Gravity, Urine: >=1.03 (ref 1.005–1.030)
UROBILINOGEN UA: 0.2 mg/dL (ref 0.0–1.0)

## 2016-04-17 MED ORDER — SULFAMETHOXAZOLE-TRIMETHOPRIM 800-160 MG PO TABS
1.0000 | ORAL_TABLET | Freq: Two times a day (BID) | ORAL | Status: DC
Start: 2016-04-17 — End: 2016-05-13

## 2016-04-17 NOTE — Progress Notes (Signed)
  ASSESSMENT: Pt currently experiencing Major depressive episode, in partial remission since last visit. Pt needs to f/u with OB.  Pt would benefit from brief therapeutic interventions.  Pt may benefit from psychiatry.  Stage of Change: contemplation  PLAN: 1. F/U with behavioral health clinician in as needed 2. Psychiatric Medications: Atarax, continue taking as prescribed 3. Behavioral recommendations:   -Continue with plans to move by mid-August -Consider establishing psychiatric care, as needed, prior to move   SUBJECTIVE: Pt. referred by Dr Macon LargeAnyanwu for symptoms of anxiety and depression Pt. reports the following symptoms/concerns: Pt states that since taking Atarax, she has been able to get enough sleep the past two nights to feel rested; she feels her mood has improved considerably since last visit, and has come to accept loss of relationship with FOB. Pt is now looking forward to move in one month; mother and sister continue to be supportive. Increase in anxiety attributed to missing medical appointment and traffic stop on way here today. Duration of problem: Decrease in symptoms, 2 days Severity: mild (depressive symptoms)/ moderate (anxious symptoms)   OBJECTIVE: Orientation & Cognition: Oriented x3. Thought processes normal and appropriate to situation. Mood: appropriate Affect: appropriate Appearance: appropriate Risk of harm to self or others: no risk of harm to self or others today Substance use: none Assessments administered: PHQ9: 8/ GAD7: 16  Diagnosis: Major depressive disorder, in partial remission CPT Code: F33.41  -------------------------------------------- Other(s) present in the room:none  Time spent with patient in exam room: 16 minutes 9:01 to 9:17am   Dep[facility] 04/17/2016 04/15/2016  Decreased Interest 2 0  Down, Depressed, Hopeless 1 3  PHQ - 2 Score 3 3  Altered sleeping 1 3  Tired, decreased energy 1 3  Change in appetite - 3   Feeling bad or failure about yourself  2 3  Trouble concentrating 1 0  Moving slowly or fidgety/restless 0 3  Suicidal thoughts 0 0  PHQ-9 Score 8 18  Difficult doing work/chores - Very difficult    GAD 7 : Generalized Anxiety Score 04/17/2016 04/15/2016  Nervous, Anxious, on Edge 2 3  Control/stop worrying 3 3  Worry too much - different things 3 3  Trouble relaxing 2 2  Restless 0 0  Easily annoyed or irritable 3 1  Afraid - awful might happen 3 3  Total GAD 7 Score 16 15  Anxiety Difficulty - Somewhat difficult

## 2016-04-17 NOTE — Patient Instructions (Addendum)

## 2016-04-17 NOTE — Progress Notes (Signed)
Patient would to wait until next visit for a Pap Smear.

## 2016-04-17 NOTE — Progress Notes (Signed)
   Subjective:    Tonya Yates is a [id number] being seen today for her first obstetrical visit.  Her obstetrical history is significant for none. Patient does not intend to breast feed. Pregnancy history fully reviewed.  Patient reports nausea and vomiting.  Filed Vitals:   04/17/16 0920  BP: 116/61  Pulse: 74  Weight: 166 lb 9.6 oz (75.569 kg)    HISTORY: OB History  Gravida Para Term Preterm AB SAB TAB Ectopic Multiple Living  9 5 4 1 3 2 1   4     # Outcome Date GA Lbr Len/2nd Weight Sex Delivery Anes PTL Lv  9 Current           8 Preterm  3964w0d       FD  7 TAB           6 SAB           5 SAB           4 Term           3 Term           2 Term           1 Term              Past Medical History  Diagnosis Date  . Acid reflux   . Hypoglycemia   . Anxiety   . Depression    Past Surgical History  Procedure Laterality Date  . Eye surgery     History reviewed. No pertinent family history.   Exam    Uterus:     Pelvic Exam: Deferred by pt request  System:     Skin: normal coloration and turgor, no rashes    Neurologic: gait normal; reflexes normal and symmetric   Extremities: normal strength, tone, and muscle mass   HEENT PERRLA and extra ocular movement intact   Mouth/Teeth mucous membranes moist, pharynx normal without lesions   Neck supple and no masses   Cardiovascular: regular rate and rhythm, no murmurs or gallops   Respiratory:  appears well, vitals normal, no respiratory distress, acyanotic, normal RR, ear and throat exam is normal, neck free of mass or lymphadenopathy, chest clear, no wheezing, crepitations, rhonchi, normal symmetric air entry   Abdomen: soft, non-tender; bowel sounds normal; no masses,  no organomegaly          Assessment:    Pregnancy: [name] Patient Active Problem List   Diagnosis Date Noted  . Depression, major, single episode, severe (HCC) 04/22/2015  . Suicidal ideations 04/22/2015  . Alcohol abuse 04/22/2015  .  Major depressive disorder, recurrent episode, severe with anxious distress (HCC) 04/22/2015  . Suicidal ideation         Plan:      Initial labs drawn. Prenatal vitamins. Problem list reviewed and updated. Genetic Screening discussed Quad Screen: ordered.  Ultrasound discussed; fetal survey: ordered.  Follow up in 4 weeks. 100% of 30 min visit spent on counseling and coordination of care.   Urine culture - nitrite positive UA. Will start keflex. [facility]    Tonya Yates, Tonya Yates 04/17/2016

## 2016-04-18 ENCOUNTER — Institutional Professional Consult (permissible substitution): Payer: Self-pay

## 2016-04-18 LAB — PRENATAL PROFILE (SOLSTAS)
Antibody Screen: NEGATIVE
BASOS PCT: 0 %
Basophils Absolute: 0 cells/uL (ref 0–200)
EOS PCT: 11 %
Eosinophils Absolute: 1496 cells/uL — ABNORMAL HIGH (ref 15–500)
HEMATOCRIT: 32.6 % — AB (ref 35.0–45.0)
HEMOGLOBIN: 10.8 g/dL — AB (ref 11.7–15.5)
HEP B S AG: NEGATIVE
HIV: NONREACTIVE
LYMPHS ABS: 2040 {cells}/uL (ref 850–3900)
Lymphocytes Relative: 15 %
MCH: 29.3 pg (ref 27.0–33.0)
MCHC: 33.1 g/dL (ref 32.0–36.0)
MCV: 88.3 fL (ref 80.0–100.0)
MONO ABS: 680 {cells}/uL (ref 200–950)
MPV: 10.4 fL (ref 7.5–12.5)
Monocytes Relative: 5 %
NEUTROS ABS: 9384 {cells}/uL — AB (ref 1500–7800)
NEUTROS PCT: 69 %
Platelets: 233 10*3/uL (ref 140–400)
RBC: 3.69 MIL/uL — AB (ref 3.80–5.10)
RDW: 13.7 % (ref 11.0–15.0)
Rh Type: POSITIVE
Rubella: 1.47 Index — ABNORMAL HIGH (ref <0.90)
WBC: 13.6 10*3/uL — AB (ref 3.8–10.8)

## 2016-04-21 ENCOUNTER — Encounter: Payer: Self-pay | Admitting: Family Medicine

## 2016-04-21 ENCOUNTER — Telehealth: Payer: Self-pay | Admitting: General Practice

## 2016-04-21 DIAGNOSIS — O99312 Alcohol use complicating pregnancy, second trimester: Secondary | ICD-10-CM | POA: Insufficient documentation

## 2016-04-21 LAB — PAIN MGMT, PROFILE 6 CONF W/O MM, U
6 Acetylmorphine: NEGATIVE ng/mL (ref <10)
Alcohol Metabolites: POSITIVE ng/mL — AB (ref <500)
Amphetamines: NEGATIVE ng/mL (ref <500)
Barbiturates: NEGATIVE ng/mL (ref <300)
Benzodiazepines: NEGATIVE ng/mL (ref <100)
Cocaine Metabolite: NEGATIVE ng/mL (ref <150)
Creatinine: 236.9 mg/dL (ref >=20.0)
Ethyl Glucuronide (ETG): 264082 ng/mL — ABNORMAL HIGH (ref <500)
Ethyl Sulfate (ETS): >40000 ng/mL — ABNORMAL HIGH (ref <100)
Marijuana Metabolite: NEGATIVE ng/mL (ref <20)
Methadone Metabolite: NEGATIVE ng/mL (ref <100)
Opiates: NEGATIVE ng/mL (ref <100)
Oxidant: NEGATIVE ug/mL (ref <200)
Oxycodone: NEGATIVE ng/mL (ref <100)
Phencyclidine: NEGATIVE ng/mL (ref <25)
Please note:: 0
pH: 7.33 (ref 4.5–9.0)

## 2016-04-21 NOTE — Telephone Encounter (Signed)
Patient called into front office wanting blood test results of infectious disease. Informed patient of negative hiv, hep b, & rpr. Patient verbalized understanding and had no questions

## 2016-04-22 ENCOUNTER — Encounter (HOSPITAL_COMMUNITY): Payer: Self-pay | Admitting: Family Medicine

## 2016-04-23 LAB — AFP, QUAD SCREEN
AFP: 31 ng/mL
Age Alone: 1:650 {titer}
CURR GEST AGE: 17.3 wk
HCG, Total: 35.32 IU/mL
INH: 250.2 pg/mL
INTERPRETATION-AFP: NEGATIVE
MOM FOR AFP: 0.85
MOM FOR INH: 1.53
MoM for hCG: 1.19
OPEN SPINA BIFIDA: NEGATIVE
Osb Risk: 1:22800 {titer}
TRI 18 SCR RISK EST: NEGATIVE
Trisomy 18 (Edward) Syndrome Interp.: 1:27100 {titer}
uE3 Mom: 0.79
uE3 Value: 0.86 ng/mL

## 2016-04-29 ENCOUNTER — Ambulatory Visit (HOSPITAL_COMMUNITY)
Admission: RE | Admit: 2016-04-29 | Discharge: 2016-04-29 | Disposition: A | Discharge status: Home / Self Care | Payer: Medicaid Other | Source: Ambulatory Visit | Attending: Family Medicine | Admitting: Family Medicine

## 2016-04-29 ENCOUNTER — Other Ambulatory Visit: Payer: Self-pay | Admitting: Family Medicine

## 2016-04-29 ENCOUNTER — Encounter: Payer: Self-pay | Admitting: Family Medicine

## 2016-04-29 DIAGNOSIS — Z3482 Encounter for supervision of other normal pregnancy, second trimester: Secondary | ICD-10-CM

## 2016-04-29 DIAGNOSIS — Z36 Encounter for antenatal screening of mother: Secondary | ICD-10-CM | POA: Diagnosis not present

## 2016-04-29 DIAGNOSIS — O4440 Low lying placenta NOS or without hemorrhage, unspecified trimester: Secondary | ICD-10-CM | POA: Insufficient documentation

## 2016-04-29 DIAGNOSIS — Z3A19 19 weeks gestation of pregnancy: Secondary | ICD-10-CM | POA: Insufficient documentation

## 2016-04-29 DIAGNOSIS — Z3480 Encounter for supervision of other normal pregnancy, unspecified trimester: Secondary | ICD-10-CM

## 2016-04-29 DIAGNOSIS — O09292 Supervision of pregnancy with other poor reproductive or obstetric history, second trimester: Secondary | ICD-10-CM | POA: Diagnosis not present

## 2016-05-05 ENCOUNTER — Encounter: Payer: Self-pay | Admitting: Obstetrics & Gynecology

## 2016-05-13 ENCOUNTER — Ambulatory Visit (HOSPITAL_COMMUNITY)
Admission: RE | Admit: 2016-05-13 | Discharge: 2016-05-13 | Disposition: A | Discharge status: Home / Self Care | Payer: Medicaid Other | Source: Ambulatory Visit | Attending: Family Medicine | Admitting: Family Medicine

## 2016-05-13 ENCOUNTER — Encounter (HOSPITAL_COMMUNITY): Payer: Self-pay

## 2016-05-13 ENCOUNTER — Other Ambulatory Visit (HOSPITAL_COMMUNITY): Payer: Self-pay | Admitting: Maternal and Fetal Medicine

## 2016-05-13 ENCOUNTER — Inpatient Hospital Stay (HOSPITAL_COMMUNITY)
Admission: AD | Admit: 2016-05-13 | Discharge: 2016-05-13 | Disposition: A | Discharge status: Home / Self Care | Payer: Medicaid Other | Source: Ambulatory Visit | Attending: Obstetrics & Gynecology | Admitting: Obstetrics & Gynecology

## 2016-05-13 DIAGNOSIS — O26892 Other specified pregnancy related conditions, second trimester: Secondary | ICD-10-CM | POA: Insufficient documentation

## 2016-05-13 DIAGNOSIS — O99332 Smoking (tobacco) complicating pregnancy, second trimester: Secondary | ICD-10-CM | POA: Diagnosis not present

## 2016-05-13 DIAGNOSIS — R1084 Generalized abdominal pain: Secondary | ICD-10-CM | POA: Diagnosis present

## 2016-05-13 DIAGNOSIS — O26899 Other specified pregnancy related conditions, unspecified trimester: Secondary | ICD-10-CM

## 2016-05-13 DIAGNOSIS — O9989 Other specified diseases and conditions complicating pregnancy, childbirth and the puerperium: Secondary | ICD-10-CM

## 2016-05-13 DIAGNOSIS — R12 Heartburn: Secondary | ICD-10-CM | POA: Insufficient documentation

## 2016-05-13 DIAGNOSIS — K219 Gastro-esophageal reflux disease without esophagitis: Secondary | ICD-10-CM | POA: Insufficient documentation

## 2016-05-13 DIAGNOSIS — F17210 Nicotine dependence, cigarettes, uncomplicated: Secondary | ICD-10-CM | POA: Insufficient documentation

## 2016-05-13 DIAGNOSIS — Z3A21 21 weeks gestation of pregnancy: Secondary | ICD-10-CM | POA: Diagnosis present

## 2016-05-13 DIAGNOSIS — O09292 Supervision of pregnancy with other poor reproductive or obstetric history, second trimester: Secondary | ICD-10-CM

## 2016-05-13 DIAGNOSIS — O3432 Maternal care for cervical incompetence, second trimester: Secondary | ICD-10-CM | POA: Insufficient documentation

## 2016-05-13 DIAGNOSIS — O4442 Low lying placenta NOS or without hemorrhage, second trimester: Secondary | ICD-10-CM | POA: Insufficient documentation

## 2016-05-13 DIAGNOSIS — R102 Pelvic and perineal pain: Secondary | ICD-10-CM

## 2016-05-13 DIAGNOSIS — O4440 Low lying placenta NOS or without hemorrhage, unspecified trimester: Secondary | ICD-10-CM

## 2016-05-13 DIAGNOSIS — F329 Major depressive disorder, single episode, unspecified: Secondary | ICD-10-CM | POA: Insufficient documentation

## 2016-05-13 DIAGNOSIS — O99342 Other mental disorders complicating pregnancy, second trimester: Secondary | ICD-10-CM | POA: Insufficient documentation

## 2016-05-13 DIAGNOSIS — R109 Unspecified abdominal pain: Secondary | ICD-10-CM

## 2016-05-13 DIAGNOSIS — Z3480 Encounter for supervision of other normal pregnancy, unspecified trimester: Secondary | ICD-10-CM

## 2016-05-13 LAB — URINALYSIS, ROUTINE W REFLEX MICROSCOPIC
Bilirubin Urine: NEGATIVE
GLUCOSE, UA: NEGATIVE mg/dL
Ketones, ur: NEGATIVE mg/dL
LEUKOCYTES UA: NEGATIVE
NITRITE: NEGATIVE
PROTEIN: NEGATIVE mg/dL
Specific Gravity, Urine: <1.005 — ABNORMAL LOW (ref 1.005–1.030)
pH: 6.5 (ref 5.0–8.0)

## 2016-05-13 LAB — AMYLASE: Amylase: 80 U/L (ref 28–100)

## 2016-05-13 LAB — ETHANOL: Alcohol, Ethyl (B): <5 mg/dL (ref <5)

## 2016-05-13 LAB — RAPID URINE DRUG SCREEN, HOSP PERFORMED
Amphetamines: NOT DETECTED
Barbiturates: NOT DETECTED
Benzodiazepines: NOT DETECTED
Cocaine: NOT DETECTED
Opiates: NOT DETECTED
Tetrahydrocannabinol: NOT DETECTED

## 2016-05-13 LAB — COMPREHENSIVE METABOLIC PANEL
ALBUMIN: 3.2 g/dL — AB (ref 3.5–5.0)
ALK PHOS: 52 U/L (ref 38–126)
ALT: 16 U/L (ref 14–54)
ANION GAP: 4 — AB (ref 5–15)
AST: 16 U/L (ref 15–41)
BUN: 6 mg/dL (ref 6–20)
CALCIUM: 9.3 mg/dL (ref 8.9–10.3)
CHLORIDE: 107 mmol/L (ref 101–111)
CO2: 24 mmol/L (ref 22–32)
CREATININE: 0.44 mg/dL (ref 0.44–1.00)
GFR calc Af Amer: >60 mL/min (ref >60)
GFR calc non Af Amer: >60 mL/min (ref >60)
GLUCOSE: 103 mg/dL — AB (ref 65–99)
Potassium: 3.6 mmol/L (ref 3.5–5.1)
SODIUM: 135 mmol/L (ref 135–145)
TOTAL PROTEIN: 5.9 g/dL — AB (ref 6.5–8.1)
Total Bilirubin: 0.6 mg/dL (ref 0.3–1.2)

## 2016-05-13 LAB — WET PREP, GENITAL
CLUE CELLS WET PREP: NONE SEEN
Sperm: NONE SEEN
TRICH WET PREP: NONE SEEN
YEAST WET PREP: NONE SEEN

## 2016-05-13 LAB — URINE MICROSCOPIC-ADD ON

## 2016-05-13 LAB — CBC
HEMATOCRIT: 30.7 % — AB (ref 36.0–46.0)
HEMOGLOBIN: 10.2 g/dL — AB (ref 12.0–15.0)
MCH: 28.9 pg (ref 26.0–34.0)
MCHC: 33.2 g/dL (ref 30.0–36.0)
MCV: 87 fL (ref 78.0–100.0)
Platelets: 211 10*3/uL (ref 150–400)
RBC: 3.53 MIL/uL — AB (ref 3.87–5.11)
RDW: 14.1 % (ref 11.5–15.5)
WBC: 13.6 10*3/uL — AB (ref 4.0–10.5)

## 2016-05-13 LAB — LIPASE, BLOOD: LIPASE: 26 U/L (ref 11–51)

## 2016-05-13 MED ORDER — RANITIDINE HCL 150 MG PO TABS: 150.0000 mg | ORAL_TABLET | Freq: Two times a day (BID) | ORAL | 1 refills | Status: DC

## 2016-05-13 MED ORDER — GI COCKTAIL ~~LOC~~
30.0000 mL | Freq: Once | ORAL | Status: AC
  Administered 2016-05-13: 30 mL via ORAL
  Filled 2016-05-13: qty 30

## 2016-05-13 NOTE — MAU Note (Signed)
Pt reports cramping all the time, having contractions, feeling weak,

## 2016-05-13 NOTE — Discharge Instructions (Signed)

## 2016-05-13 NOTE — MAU Provider Note (Signed)
Chief Complaint: No chief complaint on file.   None     SUBJECTIVE HPI: Tonya Yates is a 30 y.o. [phone] at 31w1dby LMP pt of [place] presents [place] matern[facility] admissions reporting generalized abdominal pain, both upper abdominal/epigastric pain and lower abdominal cramping x 1 week, worsening tonight.  She reports the pain is sometimes worse after eating but nothing makes it better. It is associated with back pain. She denies vaginal bleeding, vaginal itching/burning, urinary symptoms, h/a, dizziness, n/v, or fever/chills.    Pt had low-lying placenta on previous [place] no previa. Was told she could have intercourse.   HPI  Past Medical History:  Diagnosis Date  . Acid reflux   . Anxiety   . Depression   . Hypoglycemia    Past Surgical History:  Procedure Laterality Date  . EYE SURGERY     Social History   Social History  . Marital status: Single    Spouse name: N/A  . Number of children: N/A  . Years of education: N/A   Occupational History  . Not on file.   Social History Main Topics  . Smoking status: Light Tobacco Smoker  . Smokeless tobacco: Never Used  . Alcohol use Yes     Comment: Patient reports she drinks alcohol 1x a week  . Drug use: No     Comment: Patient reports smoking marijuana in the post  . Sexual activity: Yes    Birth control/ protection: Condom   Other Topics Concern  . Not on file   Social History Narrative  . No narrative on file   No current facility-administered medications on file prior to encounter.    Current Outpatient Prescriptions on File Prior to Encounter  Medication Sig Dispense Refill  . hydrOXYzine (ATARAX/VISTARIL) 25 MG tablet Take 1-2 tablets (25-50 mg total) by mouth every 6 (six) hours as needed. (Patient not taking: Reported on 05/13/2016) 30 tablet 2  . promethazine (PHENERGAN) 25 MG tablet Take 1 tablet (25 mg total) by mouth every 6 (six) hours as needed for nausea or vomiting. (Patient not taking: Reported on  05/13/2016) 30 tablet 0  . Prenatal Vit-Fe Fumarate-FA (PRENATAL VITAMIN PO) Take by mouth.     No Known Allergies  ROS:  Review of Systems  Constitutional: Negative for chills, fatigue and fever.  Respiratory: Negative for shortness of breath.   Cardiovascular: Negative for chest pain.  Gastrointestinal: Positive for abdominal pain.  Genitourinary: Positive for pelvic pain. Negative for difficulty urinating, dysuria, flank pain, vaginal bleeding, vaginal discharge and vaginal pain.  Neurological: Negative for dizziness and headaches.  Psychiatric/Behavioral: Negative.      I have reviewed patient's Past Medical Hx, Surgical Hx, Family Hx, Social Hx, medications and allergies.   Physical Exam  Patient Vitals for the past 24 hrs:  BP Pulse  05/13/16 0418 114/60 70   Constitutional: Well-developed, well-nourished female in no acute distress.  Cardiovascular: normal rate Respiratory: normal effort GI: Abd soft, non-tender. Pos BS x 4 MS: Extremities nontender, no edema, normal ROM Neurologic: Alert and oriented x 4.  GU: Neg CVAT.  PELVIC EXAM: Cervix pink, visually closed, without lesion, scant white creamy discharge, vaginal walls and external genitalia normal Bimanual exam: Cervix 0/long/high, firm, anterior, neg CMT, uterus nontender, nonenlarged, adnexa without tenderness, enlargement, or mass  FHT 157 by doppler  LAB RESULTS  Results for orders placed or performed during the hospital encounter of 05/13/16 (from the past 72 hour(s))  Urinalysis, Routine w reflex microscopic (not at  [place])     Status: Abnormal   Collection Time: 05/13/16 12:33 AM  Result Value Ref Range   Color, Urine YELLOW YELLOW   APPearance CLEAR CLEAR   Specific Gravity, Urine <1.005 (L) 1.005 - 1.030   pH 6.5 5.0 - 8.0   Glucose, UA NEGATIVE NEGATIVE mg/dL   Hgb urine dipstick TRACE (A) NEGATIVE   Bilirubin Urine NEGATIVE NEGATIVE   Ketones, ur NEGATIVE NEGATIVE mg/dL   Protein, ur NEGATIVE  NEGATIVE mg/dL   Nitrite NEGATIVE NEGATIVE   Leukocytes, UA NEGATIVE NEGATIVE  Urine microscopic-add on     Status: Abnormal   Collection Time: 05/13/16 12:33 AM  Result Value Ref Range   Squamous Epithelial / LPF 0-5 (A) NONE SEEN   WBC, UA 0-5 0 - 5 WBC/hpf   RBC / HPF 0-5 0 - 5 RBC/hpf   Bacteria, UA RARE (A) NONE SEEN  Urine rapid drug screen (hosp performed)     Status: None   Collection Time: 05/13/16 12:33 AM  Result Value Ref Range   Opiates NONE DETECTED NONE DETECTED   Cocaine NONE DETECTED NONE DETECTED   Benzodiazepines NONE DETECTED NONE DETECTED   Amphetamines NONE DETECTED NONE DETECTED   Tetrahydrocannabinol NONE DETECTED NONE DETECTED   Barbiturates NONE DETECTED NONE DETECTED    Comment:        DRUG SCREEN FOR MEDICAL PURPOSES ONLY.  IF CONFIRMATION IS NEEDED FOR ANY PURPOSE, NOTIFY LAB WITHIN 5 DAYS.        LOWEST DETECTABLE LIMITS FOR URINE DRUG SCREEN Drug Class       Cutoff (ng/mL) Amphetamine      1000 Barbiturate      200 Benzodiazepine   751 Tricyclics       700 Opiates          300 Cocaine          300 THC              50   CBC     Status: Abnormal   Collection Time: 05/13/16  2:14 AM  Result Value Ref Range   WBC 13.6 (H) 4.0 - 10.5 K/uL   RBC 3.53 (L) 3.87 - 5.11 MIL/uL   Hemoglobin 10.2 (L) 12.0 - 15.0 g/dL   HCT 30.7 (L) 36.0 - 46.0 %   MCV 87.0 78.0 - 100.0 fL   MCH 28.9 26.0 - 34.0 pg   MCHC 33.2 30.0 - 36.0 g/dL   RDW 14.1 11.5 - 15.5 %   Platelets 211 150 - 400 K/uL  Amylase     Status: None   Collection Time: 05/13/16  2:14 AM  Result Value Ref Range   Amylase 80 28 - 100 U/L  Lipase, blood     Status: None   Collection Time: 05/13/16  2:14 AM  Result Value Ref Range   Lipase 26 11 - 51 U/L  Comprehensive metabolic panel     Status: Abnormal   Collection Time: 05/13/16  2:14 AM  Result Value Ref Range   Sodium 135 135 - 145 mmol/L   Potassium 3.6 3.5 - 5.1 mmol/L   Chloride 107 101 - 111 mmol/L   CO2 24 22 - 32 mmol/L    Glucose, Bld 103 (H) 65 - 99 mg/dL   BUN 6 6 - 20 mg/dL   Creatinine, Ser 0.44 0.44 - 1.00 mg/dL   Calcium 9.3 8.9 - 10.3 mg/dL   Total Protein 5.9 (L) 6.5 - 8.1 g/dL   Albumin 3.2 (L)  3.5 - 5.0 g/dL   AST 16 15 - 41 U/L   ALT 16 14 - 54 U/L   Alkaline Phosphatase 52 38 - 126 U/L   Total Bilirubin 0.6 0.3 - 1.2 mg/dL   GFR calc non Af Amer >60 >60 mL/min   GFR calc Af Amer >60 >60 mL/min    Comment: (NOTE) The eGFR has been calculated using the CKD EPI equation. This calculation has not been validated in all clinical situations. eGFR's persistently <60 mL/min signify possible Chronic Kidney Disease.    Anion gap 4 (L) 5 - 15  Ethanol     Status: None   Collection Time: 05/13/16  2:14 AM  Result Value Ref Range   Alcohol, Ethyl (B) <5 <5 mg/dL    Comment:        LOWEST DETECTABLE LIMIT FOR SERUM ALCOHOL IS 5 mg/dL FOR MEDICAL PURPOSES ONLY Performed at [facility]   Wet prep, genital     Status: Abnormal   Collection Time: 05/13/16  2:20 AM  Result Value Ref Range   Yeast Wet Prep HPF POC NONE SEEN NONE SEEN   Trich, Wet Prep NONE SEEN NONE SEEN   Clue Cells Wet Prep HPF POC NONE SEEN NONE SEEN   WBC, Wet Prep HPF POC FEW (A) NONE SEEN    Comment: MODERATE BACTERIA SEEN   Sperm NONE SEEN    O/POS/-- (07/13 1254)  MAU Management/MDM: Ordered labs and reviewed results.  Cervix 0/thick/high, no preterm labor.  No acute abdomen, labwork with no evidence of infection or pancreatitis.  Pt with hx of acid reflux, likely contributing to GI pain today.  GI cocktail given in MAU with some relief.  Rx for Zantac 150 mg PO BID.  Pt stable at time of discharge.  ASSESSMENT 1. Abdominal pain complicating pregnancy   2. Heartburn during pregnancy in second trimester   3. Pain of round ligament affecting pregnancy, antepartum     PLAN Discharge home with second trimester precautions F/U in office as scheduled Teaching done about acid reflux, dietary changes, etc     Medication List    STOP taking these medications   metroNIDAZOLE 500 MG tablet Commonly known as:  FLAGYL   sulfamethoxazole-trimethoprim 800-160 MG tablet Commonly known as:  BACTRIM DS,SEPTRA DS     TAKE these medications   hydrOXYzine 25 MG tablet Commonly known as:  ATARAX/VISTARIL Take 1-2 tablets (25-50 mg total) by mouth every 6 (six) hours as needed.   promethazine 25 MG tablet Commonly known as:  PHENERGAN Take 1 tablet (25 mg total) by mouth every 6 (six) hours as needed for nausea or vomiting.   ranitidine 150 MG tablet Commonly known as:  ZANTAC Take 1 tablet (150 mg total) by mouth 2 (two) times daily.        Fatima Blank Certified Nurse-Midwife 05/14/2016  3:37 AM

## 2016-05-14 LAB — GC/CHLAMYDIA PROBE AMP (~~LOC~~) NOT AT ARMC
Chlamydia: NEGATIVE
Neisseria Gonorrhea: NEGATIVE

## 2016-05-18 ENCOUNTER — Encounter (HOSPITAL_COMMUNITY): Payer: Self-pay | Admitting: Emergency Medicine

## 2016-05-18 ENCOUNTER — Emergency Department (HOSPITAL_COMMUNITY)
Admission: EM | Admit: 2016-05-18 | Discharge: 2016-05-18 | Disposition: A | Discharge status: Home / Self Care | Payer: Medicaid Other | Attending: Emergency Medicine | Admitting: Emergency Medicine

## 2016-05-18 DIAGNOSIS — Z3A00 Weeks of gestation of pregnancy not specified: Secondary | ICD-10-CM | POA: Diagnosis not present

## 2016-05-18 DIAGNOSIS — J029 Acute pharyngitis, unspecified: Secondary | ICD-10-CM | POA: Insufficient documentation

## 2016-05-18 DIAGNOSIS — F17200 Nicotine dependence, unspecified, uncomplicated: Secondary | ICD-10-CM | POA: Diagnosis not present

## 2016-05-18 DIAGNOSIS — O99511 Diseases of the respiratory system complicating pregnancy, first trimester: Secondary | ICD-10-CM | POA: Insufficient documentation

## 2016-05-18 MED ORDER — AMOXICILLIN 500 MG PO CAPS: 500.0000 mg | ORAL_CAPSULE | Freq: Three times a day (TID) | ORAL | 0 refills | Status: DC

## 2016-05-18 MED ORDER — AMOXICILLIN 500 MG PO CAPS
500.0000 mg | ORAL_CAPSULE | Freq: Once | ORAL | Status: AC
  Administered 2016-05-18: 500 mg via ORAL
  Filled 2016-05-18: qty 1

## 2016-05-18 NOTE — ED Provider Notes (Signed)
[facility]-EMERGENCY DEPT Provider Note   CSN: [id number] Arrival date & time: 05/18/16  2048  First Provider Contact:  First MD Initiated Contact with Patient 05/18/16 2113        History   Chief Complaint Chief Complaint  Patient presents with  . Sore Throat  . Nasal Congestion    HPI Tonya Yates is a 30 y.o. female.  Patient presents to the emergency department with chief complaint of sore throat. She states that the sore throat started suddenly 2 days ago. She is uncertain whether she has run a fever. She denies cough. She states that she has had swollen lymph nodes. She states that she frequently gets strep throat, and this feels similar. There are no modifying factors. Of note, patient is approximately 5 months pregnant. She does not report any abdominal complaints.   The history is provided by the patient. No language interpreter was used.    Past Medical History:  Diagnosis Date  . Acid reflux   . Anxiety   . Depression   . Hypoglycemia     Patient Active Problem List   Diagnosis Date Noted  . Low lying placenta, antepartum 04/29/2016  . Alcohol use affecting pregnancy in second trimester, antepartum 04/21/2016  . Supervision of other normal pregnancy, antepartum 04/17/2016  . Depression, major, single episode, severe (HCC) 04/22/2015  . Suicidal ideations 04/22/2015  . Alcohol abuse 04/22/2015  . Major depressive disorder, recurrent episode, severe with anxious distress (HCC) 04/22/2015  . Suicidal ideation     Past Surgical History:  Procedure Laterality Date  . EYE SURGERY      OB History    Gravida Para Term Preterm AB Living   SAB TAB Ectopic Multiple Live Births   2 1             Home Medications    Prior to Admission medications   Medication Sig Start Date End Date Taking? Authorizing Provider  hydrOXYzine (ATARAX/VISTARIL) 25 MG tablet Take 1-2 tablets (25-50 mg total) by mouth every 6 (six) hours as needed. Patient not  taking: Reported on 05/13/2016 04/15/16   Tereso Newcomer, MD  Prenatal Vit-Fe Fumarate-FA (PRENATAL VITAMIN PO) Take by mouth.    Historical Provider, MD  promethazine (PHENERGAN) 25 MG tablet Take 1 tablet (25 mg total) by mouth every 6 (six) hours as needed for nausea or vomiting. Patient not taking: Reported on 05/13/2016 02/12/16   Judeth Horn, NP  ranitidine (ZANTAC) 150 MG tablet Take 1 tablet (150 mg total) by mouth 2 (two) times daily. Patient not taking: Reported on 05/13/2016 05/13/16 05/13/17  Hurshel Party, CNM    Family History History reviewed. No pertinent family history.  Social History Social History  Substance Use Topics  . Smoking status: Light Tobacco Smoker  . Smokeless tobacco: Never Used  . Alcohol use Yes     Comment: Patient reports she drinks alcohol 1x a week     Allergies   Review of patient's allergies indicates no known allergies.   Review of Systems Review of Systems  Constitutional: Positive for chills. Negative for fever.  HENT: Positive for sore throat. Negative for drooling and postnasal drip.   Respiratory: Negative for cough and shortness of breath.   Gastrointestinal: Negative for constipation, diarrhea, nausea and vomiting.     Physical Exam Updated Vital Signs BP 118/64 (BP Location: Right Arm)   Pulse 76   Temp 98.9 F (37.2 C) (Oral)  LMP 12/27/2015   SpO2 99%   Physical Exam  Constitutional: She is oriented to person, place, and time. She appears well-developed and well-nourished.  HENT:  Head: Normocephalic and atraumatic.  Oropharynx is moderately erythematous, no exudates, uvula is midline, airway intact, no abscess, no stridor  Eyes: Conjunctivae and EOM are normal.  Neck: Normal range of motion.  Cardiovascular: Normal rate.   Pulmonary/Chest: Effort normal.  Abdominal: She exhibits no distension. There is no tenderness. There is no guarding.  Gravid abdomen  Musculoskeletal: Normal range of motion.  Neurological:  She is alert and oriented to person, place, and time.  Skin: Skin is dry.  Psychiatric: She has a normal mood and affect. Her behavior is normal. Judgment and thought content normal.  Nursing note and vitals reviewed.    ED Treatments / Results  Labs (all labs ordered are listed, but only abnormal results are displayed) Labs Reviewed - No data to display  EKG  EKG Interpretation None       Radiology No results found.  Procedures Procedures (including critical care time)  Medications Ordered in ED Medications - No data to display   Initial Impression / Assessment and Plan / ED Course  I have reviewed the triage vital signs and the nursing notes.  Pertinent labs & imaging results that were available during my care of the patient were reviewed by me and considered in my medical decision making (see chart for details).  Clinical Course    Patient with sudden onset sore throat. No cough. Questionable fever. Quite erythematous, no abscess. States this feels like strep throat, and she has strep throat frequently. Will treat with amoxicillin.  Final Clinical Impressions(s) / ED Diagnoses   Final diagnoses:  Sore throat    New Prescriptions New Prescriptions   AMOXICILLIN (AMOXIL) 500 MG CAPSULE    Take 1 capsule (500 mg total) by mouth 3 (three) times daily.     Roxy Horsemanobert Isaish Alemu, PA-C 05/18/16 2129    Doug SouSam Jacubowitz, MD 05/19/16 [phone]

## 2016-05-18 NOTE — ED Triage Notes (Signed)
Sore throat, ear pain, and nasal congestion for two days.

## 2016-05-21 ENCOUNTER — Encounter: Payer: Self-pay | Admitting: Medical

## 2016-05-26 ENCOUNTER — Encounter (HOSPITAL_COMMUNITY): Payer: Self-pay

## 2016-05-27 ENCOUNTER — Ambulatory Visit (HOSPITAL_COMMUNITY): Admission: RE | Admit: 2016-05-27 | Payer: Medicaid Other | Source: Ambulatory Visit

## 2016-06-10 ENCOUNTER — Inpatient Hospital Stay (HOSPITAL_COMMUNITY): Admission: RE | Admit: 2016-06-10 | Payer: Self-pay | Source: Ambulatory Visit

## 2016-07-23 ENCOUNTER — Encounter (HOSPITAL_COMMUNITY): Payer: Self-pay

## 2016-07-23 ENCOUNTER — Inpatient Hospital Stay (HOSPITAL_COMMUNITY)
Admission: AD | Admit: 2016-07-23 | Discharge: 2016-07-23 | Disposition: A | Discharge status: Home / Self Care | Payer: Medicaid Other | Source: Ambulatory Visit | Attending: Obstetrics & Gynecology | Admitting: Obstetrics & Gynecology

## 2016-07-23 DIAGNOSIS — R102 Pelvic and perineal pain: Secondary | ICD-10-CM | POA: Insufficient documentation

## 2016-07-23 DIAGNOSIS — O99333 Smoking (tobacco) complicating pregnancy, third trimester: Secondary | ICD-10-CM | POA: Insufficient documentation

## 2016-07-23 DIAGNOSIS — O99613 Diseases of the digestive system complicating pregnancy, third trimester: Secondary | ICD-10-CM | POA: Insufficient documentation

## 2016-07-23 DIAGNOSIS — O219 Vomiting of pregnancy, unspecified: Secondary | ICD-10-CM

## 2016-07-23 DIAGNOSIS — O212 Late vomiting of pregnancy: Secondary | ICD-10-CM | POA: Insufficient documentation

## 2016-07-23 DIAGNOSIS — Z3A31 31 weeks gestation of pregnancy: Secondary | ICD-10-CM | POA: Insufficient documentation

## 2016-07-23 DIAGNOSIS — K219 Gastro-esophageal reflux disease without esophagitis: Secondary | ICD-10-CM | POA: Insufficient documentation

## 2016-07-23 LAB — URINALYSIS, ROUTINE W REFLEX MICROSCOPIC
Bilirubin Urine: NEGATIVE
Glucose, UA: NEGATIVE mg/dL
Hgb urine dipstick: NEGATIVE
Ketones, ur: NEGATIVE mg/dL
LEUKOCYTES UA: NEGATIVE
Nitrite: NEGATIVE
PROTEIN: NEGATIVE mg/dL
Specific Gravity, Urine: <1.005 — ABNORMAL LOW (ref 1.005–1.030)
pH: 6.5 (ref 5.0–8.0)

## 2016-07-23 MED ORDER — PROMETHAZINE HCL 12.5 MG PO TABS: 12.5000 mg | ORAL_TABLET | Freq: Four times a day (QID) | ORAL | 0 refills | Status: DC | PRN

## 2016-07-23 MED ORDER — PANTOPRAZOLE SODIUM 40 MG PO TBEC: 40.0000 mg | DELAYED_RELEASE_TABLET | Freq: Every day | ORAL | 2 refills | Status: DC

## 2016-07-23 MED ORDER — FAMOTIDINE IN NACL 20-0.9 MG/50ML-% IV SOLN
20.0000 mg | Freq: Once | INTRAVENOUS | Status: AC
  Administered 2016-07-23: 20 mg via INTRAVENOUS
  Filled 2016-07-23: qty 50

## 2016-07-23 MED ORDER — HYDROXYZINE HCL 25 MG PO TABS: 25.0000 mg | ORAL_TABLET | Freq: Four times a day (QID) | ORAL | 0 refills | Status: DC

## 2016-07-23 MED ORDER — ONDANSETRON HCL 4 MG/2ML IJ SOLN
4.0000 mg | Freq: Once | INTRAMUSCULAR | Status: AC
  Administered 2016-07-23: 4 mg via INTRAVENOUS
  Filled 2016-07-23: qty 2

## 2016-07-23 MED ORDER — LACTATED RINGERS IV BOLUS (SEPSIS)
1000.0000 mL | Freq: Once | INTRAVENOUS | Status: AC
  Administered 2016-07-23: 1000 mL via INTRAVENOUS

## 2016-07-23 NOTE — MAU Provider Note (Signed)
History     CSN: [id number]  Arrival date and time: 07/23/16 1920   None     Chief Complaint  Patient presents with  . Abdominal Pain   HPI   Ms.Tonya Yates is a 30 y.o. female [phone] @ [redacted]w[redacted]d here with nausea/ vomiting and abdominal pain. The pain is located in her lower abdomen. The pain started 2 weeks ago. The pain comes and goes; the pain worsens when she is up walking around or changes positions.   She has vomited 4 times today. Says she has been taking Zantac over the counter for acid reflux. She states she eats a lot of pizza. She has had N/V throughout her pregnancy however it was worse in the first trimester. She states she has a lot going on, trying to get settled here in [place], her family is not here. She states she is not sleeping well at night.   OB History    Gravida Para Term Preterm AB Living   9 5 4 1 3 4    SAB TAB Ectopic Multiple Live Births   2 1            Past Medical History:  Diagnosis Date  . Acid reflux   . Anxiety   . Depression   . Hypoglycemia     Past Surgical History:  Procedure Laterality Date  . EYE SURGERY      History reviewed. No pertinent family history.  Social History  Substance Use Topics  . Smoking status: Light Tobacco Smoker  . Smokeless tobacco: Never Used  . Alcohol use Yes     Comment: Patient reports she drinks alcohol 1x a week    Allergies: No Known Allergies  Prescriptions Prior to Admission  Medication Sig Dispense Refill Last Dose  . amoxicillin (AMOXIL) 500 MG capsule Take 1 capsule (500 mg total) by mouth 3 (three) times daily. 30 capsule 0   . hydrOXYzine (ATARAX/VISTARIL) 25 MG tablet Take 1-2 tablets (25-50 mg total) by mouth every 6 (six) hours as needed. (Patient not taking: Reported on 05/13/2016) 30 tablet 2 Not Taking  . Prenatal Vit-Fe Fumarate-FA (PRENATAL VITAMIN PO) Take by mouth.   Taking  . promethazine (PHENERGAN) 25 MG tablet Take 1 tablet (25 mg total) by mouth every 6 (six) hours as  needed for nausea or vomiting. (Patient not taking: Reported on 05/13/2016) 30 tablet 0 Not Taking  . ranitidine (ZANTAC) 150 MG tablet Take 1 tablet (150 mg total) by mouth 2 (two) times daily. (Patient not taking: Reported on 05/13/2016) 60 tablet 1 Not Taking   Results for orders placed or performed during the hospital encounter of 07/23/16 (from the past 48 hour(s))  Urinalysis, Routine w reflex microscopic (not at [facility])     Status: Abnormal   Collection Time: 07/23/16  7:25 PM  Result Value Ref Range   Color, Urine YELLOW YELLOW   APPearance CLEAR CLEAR   Specific Gravity, Urine <1.005 (L) 1.005 - 1.030   pH 6.5 5.0 - 8.0   Glucose, UA NEGATIVE NEGATIVE mg/dL   Hgb urine dipstick NEGATIVE NEGATIVE   Bilirubin Urine NEGATIVE NEGATIVE   Ketones, ur NEGATIVE NEGATIVE mg/dL   Protein, ur NEGATIVE NEGATIVE mg/dL   Nitrite NEGATIVE NEGATIVE   Leukocytes, UA NEGATIVE NEGATIVE    Comment: MICROSCOPIC NOT DONE ON URINES WITH NEGATIVE PROTEIN, BLOOD, LEUKOCYTES, NITRITE, OR GLUCOSE <1000 mg/dL.    Review of Systems  Constitutional: Negative for chills and fever.  Cardiovascular: Negative for chest pain.  Gastrointestinal: Positive for abdominal pain, heartburn, nausea and vomiting.  Genitourinary: Negative for dysuria and urgency.  Neurological: Negative for dizziness.   Physical Exam   Blood pressure 134/62, pulse 68, temperature 98 F (36.7 C), temperature source Oral, resp. rate 18, last menstrual period 12/27/2015.  Physical Exam  Constitutional: She is oriented to person, place, and time. She appears well-developed and well-nourished. No distress.  HENT:  Head: Normocephalic.  Eyes: Pupils are equal, round, and reactive to light.  GI: Soft. She exhibits no distension and no mass. There is no tenderness. There is no rebound and no guarding.  Genitourinary:  Genitourinary Comments: Dilation: Closed Exam by:: Venia CarbonJennifer Meade Hogeland NP   Musculoskeletal: Normal range of motion.   Neurological: She is alert and oriented to person, place, and time.  Skin: Skin is warm. She is not diaphoretic.  Psychiatric: Her behavior is normal.   Fetal Tracing: Baseline: 140 bpm  Variability: Moderate  Accelerations: 15x15 Decelerations: none Toco: Quiet   MAU Course  Procedures  None  MDM  LR bolus Zofran  IV Pepcid IV  Patient feeling much better after fluids.   Assessment and Plan   A:  Nausea and vomiting in pregnancy; third trimester Round ligament pain.    P:  Discharge home in stable condition Call the clinic to schedule a follow up appointment- patient encourage to start care.  Rx: Protonix, phenergan  Pregnancy support belt encouraged Preterm labor precautions.   Duane LopeJennifer I Levy Cedano, NP 07/23/2016 8:07 PM

## 2016-07-23 NOTE — Discharge Instructions (Signed)
Nausea and Vomiting  Nausea means you feel sick to your stomach. Throwing up (vomiting) is a reflex where stomach contents come out of your mouth.  HOME CARE   · Take medicine as told by your doctor.  · Do not force yourself to eat. However, you do need to drink fluids.  · If you feel like eating, eat a normal diet as told by your doctor.    Eat rice, wheat, potatoes, bread, lean meats, yogurt, fruits, and vegetables.    Avoid high-fat foods.  · Drink enough fluids to keep your pee (urine) clear or pale yellow.  · Ask your doctor how to replace body fluid losses (rehydrate). Signs of body fluid loss (dehydration) include:    Feeling very thirsty.    Dry lips and mouth.    Feeling dizzy.    Dark pee.    Peeing less than normal.    Feeling confused.    Fast breathing or heart rate.  GET HELP RIGHT AWAY IF:   · You have blood in your throw up.  · You have black or bloody poop (stool).  · You have a bad headache or stiff neck.  · You feel confused.  · You have bad belly (abdominal) pain.  · You have chest pain or trouble breathing.  · You do not pee at least once every 8 hours.  · You have cold, clammy skin.  · You keep throwing up after 24 to 48 hours.  · You have a fever.  MAKE SURE YOU:   · Understand these instructions.  · Will watch your condition.  · Will get help right away if you are not doing well or get worse.     This information is not intended to replace advice given to you by your health care provider. Make sure you discuss any questions you have with your health care provider.     Document Released: 03/10/2008 Document Revised: 12/15/2011 Document Reviewed: 02/21/2011  Elsevier Interactive Patient Education ©2016 [employer]

## 2016-07-23 NOTE — MAU Note (Signed)
Pt presents complaining of nausea and vomiting for a week. Can't keep anything down. States she can't sleep at night and is always on edge. Feels like she has not slept well in 2 weeks. Also feels like she's having braxton hicks contractions. Denies vaginal bleeding or leaking of fluid. Reports good fetal movement.

## 2016-07-24 ENCOUNTER — Ambulatory Visit (INDEPENDENT_AMBULATORY_CARE_PROVIDER_SITE_OTHER): Payer: Medicaid Other | Admitting: Obstetrics and Gynecology

## 2016-07-24 ENCOUNTER — Ambulatory Visit (INDEPENDENT_AMBULATORY_CARE_PROVIDER_SITE_OTHER): Payer: Self-pay | Admitting: Clinical

## 2016-07-24 VITALS — BP 111/59 | HR 63 | Wt 190.6 lb

## 2016-07-24 DIAGNOSIS — Z3483 Encounter for supervision of other normal pregnancy, third trimester: Secondary | ICD-10-CM

## 2016-07-24 DIAGNOSIS — Z3480 Encounter for supervision of other normal pregnancy, unspecified trimester: Secondary | ICD-10-CM

## 2016-07-24 DIAGNOSIS — F3340 Major depressive disorder, recurrent, in remission, unspecified: Secondary | ICD-10-CM

## 2016-07-24 LAB — POCT URINALYSIS DIP (DEVICE)
Bilirubin Urine: NEGATIVE
Glucose, UA: NEGATIVE mg/dL
HGB URINE DIPSTICK: NEGATIVE
Ketones, ur: NEGATIVE mg/dL
LEUKOCYTES UA: NEGATIVE
NITRITE: NEGATIVE
PROTEIN: NEGATIVE mg/dL
SPECIFIC GRAVITY, URINE: 1.01 (ref 1.005–1.030)
UROBILINOGEN UA: 0.2 mg/dL (ref 0.0–1.0)
pH: 7 (ref 5.0–8.0)

## 2016-07-24 LAB — CBC
HEMATOCRIT: 32.9 % — AB (ref 35.0–45.0)
Hemoglobin: 10.8 g/dL — ABNORMAL LOW (ref 11.7–15.5)
MCH: 29.3 pg (ref 27.0–33.0)
MCHC: 32.8 g/dL (ref 32.0–36.0)
MCV: 89.2 fL (ref 80.0–100.0)
MPV: 10.9 fL (ref 7.5–12.5)
PLATELETS: 230 10*3/uL (ref 140–400)
RBC: 3.69 MIL/uL — ABNORMAL LOW (ref 3.80–5.10)
RDW: 13.7 % (ref 11.0–15.0)
WBC: 12.5 10*3/uL — ABNORMAL HIGH (ref 3.8–10.8)

## 2016-07-24 NOTE — Progress Notes (Signed)
   PRENATAL VISIT NOTE  Subjective:  Tonya Yates is a 30 y.o. [phone] at 7139w2d being seen today for ongoing prenatal care.  She is currently monitored for the following issues for this high-risk pregnancy and has Depression, major, single episode, severe (HCC); Suicidal ideations; Alcohol abuse; Suicidal ideation; Major depressive disorder, recurrent episode, severe with anxious distress (HCC); Supervision of other normal pregnancy, antepartum; Alcohol use affecting pregnancy in second trimester, antepartum; and Low lying placenta, antepartum on her problem list.  Patient reports no complaints.  Contractions: Irregular. Vag. Bleeding: None.  Movement: Present. Denies leaking of fluid.   The following portions of the patient's history were reviewed and updated as appropriate: allergies, current medications, past family history, past medical history, past social history, past surgical history and problem list. Problem list updated.  Objective:   Vitals:   07/24/16 1511  BP: (!) 111/59  Pulse: 63  Weight: 190 lb 9.6 oz (86.5 kg)    Fetal Status: Fetal Heart Rate (bpm): 150 Fundal Height: 32 cm Movement: Present     General:  Alert, oriented and cooperative. Patient is in no acute distress.  Skin: Skin is warm and dry. No rash noted.   Cardiovascular: Normal heart rate noted  Respiratory: Normal respiratory effort, no problems with respiration noted  Abdomen: Soft, gravid, appropriate for gestational age. Pain/Pressure: Present     Pelvic:  Cervical exam deferred        Extremities: Normal range of motion.  Edema: Trace  Mental Status: Normal mood and affect. Normal behavior. Normal judgment and thought content.   Assessment and Plan:  Pregnancy: [id number] at 7639w2d  1. Supervision of other normal pregnancy, antepartum -Pt started care with us moved away and has now returned.  - Glucose Tolerance, 1 HR (50g) - had left without getting blood drawn previously - CBC - RPR - HIV  antibody 2. Alcohol Use in Pregnancy: Encourage pt to stop. She endorse drinking 1-2 glasses of red wine a few nights a week 3. History of Pre-term delivery at 22 wks: pt has not been on makeena. Given that she is now 31 wks will not start anything, given pre-term labor precautions -Pt had been getting US with MFM for cervical length and growth.  -out of window for cervical length follow up, but does need repeat growth scan per MFM recommendations on last US. 4. Low lying placenta -repeat US ordered for growth, will also look at placental location, pt was told she would need a c-section, but according to last US should be OK for vag delivery.   Preterm labor symptoms and general obstetric precautions including but not limited to vaginal bleeding, contractions, leaking of fluid and fetal movement were reviewed in detail with the patient. Please refer to After Visit Summary for other counseling recommendations.  Return in about 2 weeks (around 08/07/2016) for HROB.  Lorne SkeensNicholas Michael Schenk, MD

## 2016-07-24 NOTE — BH Specialist Note (Signed)
Session Start time: 3:20   End Time: 3:50 Total Time:  30 minutes Type of Service: Behavioral Health - Individual/Family Interpreter: No.   Interpreter Name & Language: n/a # [facility]-June [facility]   SUBJECTIVE: Tonya Yates is a 30 y.o. female  Pt. was f/u for:  depression. Pt. reports the following symptoms/concerns: Pt states that she has recently moved back to [place], is staying with a friend, working part-time, and has come to terms with loss of previous relationship. 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  Pt may benefit from continued brief therapeutic interventions.      PLAN: 1. F/U with behavioral health clinician: One month, or as needed, if symptoms increase 2. Behavioral Health meds: Atarax 3. Behavioral recommendations:  -Read and fill out Postpartum Planner to discuss further at next visit, as needed -Use calming apps for coping with future increase in symptoms 4. Referral: Brief Counseling/Psychotherapy 5. From scale of 1-10, how likely are you to follow plan: 10   Gaynell FaceJamie C Tavarious Freel LCSWA Behavioral Health Clinician  Warmhandoff:   Warm Hand Off Completed.       Depression screen [facility] 2/9  07/24/2016 04/17/2016 04/15/2016  Decreased Interest 0 2 0  Down, Depressed, Hopeless 0 1 3  PHQ - 2 Score 0 3 3  Altered sleeping 3 1 3   Tired, decreased energy 3 1 3   Change in appetite 1 - 3  Feeling bad or failure about yourself  0 2 3  Trouble concentrating 0 1 0  Moving slowly or fidgety/restless 0 0 3  Suicidal thoughts 0 0 0  PHQ-9 Score 7 8 18   Difficult doing work/chores - - Very difficult   GAD 7 : Generalized Anxiety Score 07/24/2016 04/17/2016 04/15/2016  Nervous, Anxious, on Edge 2 2 3   Control/stop worrying 0 3 3  Worry too much - different things 0 3 3  Trouble relaxing 0 2 2  Restless 2 0 0  Easily annoyed or irritable 2 3 1   Afraid - awful might happen 0 3 3  Total GAD 7 Score 6 16 15   Anxiety Difficulty - - Somewhat difficult

## 2016-07-24 NOTE — Patient Instructions (Signed)
Postpartum Tubal Ligation Postpartum tubal ligation (PPTL) is a procedure that closes the fallopian tubes right after childbirth or 1-2 days after childbirth. PPTL is done before the uterus returns to its normal location. The procedure is also called a mini-laparotomy. When the fallopian tubes are closed, the eggs that are released from the ovaries cannot enter the uterus, and sperm cannot reach the egg. PPTL is done so you will not be able to get pregnant or have a baby. Although this procedure may be undone (reversed), it should be considered permanent and irreversible. If you want to have future pregnancies, you should not have this procedure. LET YOUR HEALTH CARE PROVIDER KNOW ABOUT:  Any allergies you have.  All medicines you are taking, including vitamins, herbs, eye drops, creams, and over-the-counter medicines. This includes any use of steroids, either by mouth or in cream form.  Previous problems you or members of your family have had with the use of anesthetics.  Any blood disorders you have.  Previous surgeries you have had.  Any medical conditions you may have. RISKS AND COMPLICATIONS  Infection.  Bleeding.  Injury to surrounding organs.  Side effects from anesthetics.  Failure of the procedure.  Ectopic pregnancy.  Future regret about having the procedure done. BEFORE THE PROCEDURE  You may need to sign certain documents, including an informed consent form, up to 30 days before the date of your tubal ligation.  Follow instructions from your health care provider about eating and drinking restrictions. PROCEDURE  If done 1-2 days after a vaginal delivery:  You will be given one or more of the following:  A medicine that helps you relax (sedative).  A medicine that numbs the area (local anesthetic).  A medicine that makes you fall asleep (general anesthetic).  A medicine that is injected into an area of your body that numbs everything below the injection site  (regional anesthetic).  If you have been given general anesthetic, a tube will be put down your throat to help you breathe.  Your bladder may be emptied with a small tube (catheter).  A small cut (incision) will be made just above the pubic hair line.  The fallopian tubes will be located and brought up through the incision.  The fallopian tubes will be tied off or burned (cauterized), or they will be closed with a clamp, ring, or clip. In many cases, a small portion in the center of each fallopian tube will also be removed.  The incision will be closed with stitches (sutures).  A bandage (dressing) will be placed over the incision. The procedure may vary among health care providers and hospitals. If done after a cesarean delivery:  Tubal ligation will be done through the incision that was used for the cesarean delivery of your baby.  After the tubes are closed, the incision will be closed with stitches (sutures).  A bandage (dressing) will be placed over the incision. The procedure may vary among health care providers and hospitals. AFTER THE PROCEDURE  Your blood pressure, heart rate, breathing rate, and blood oxygen level will be monitored often until the medicines you were given have worn off.  You will be given pain medicine as needed.  If you had general anesthetic, you may have some mild discomfort in your throat. This is from the breathing tube that was placed in your throat while you were sleeping.  You may feel tired, and you should rest for the remainder of the day.  You may have some pain   or cramps in the abdominal area for 3-7 days.   This information is not intended to replace advice given to you by your health care provider. Make sure you discuss any questions you have with your health care provider.   Document Released: 09/22/2005 Document Revised: 02/06/2015 Document Reviewed: 01/03/2012 Elsevier Interactive Patient Education 2016 Elsevier Inc.  

## 2016-07-24 NOTE — Progress Notes (Signed)
Declines tdap. Declined 1 hr gtt at first and then consented.

## 2016-07-25 LAB — HIV ANTIBODY (ROUTINE TESTING W REFLEX): HIV 1&2 Ab, 4th Generation: NONREACTIVE

## 2016-07-25 LAB — GLUCOSE TOLERANCE, 1 HOUR (50G) W/O FASTING: Glucose, 1 Hr, gestational: 107 mg/dL (ref <140)

## 2016-07-25 LAB — RPR

## 2016-08-01 ENCOUNTER — Ambulatory Visit (HOSPITAL_COMMUNITY): Payer: Self-pay

## 2016-08-07 ENCOUNTER — Ambulatory Visit (HOSPITAL_COMMUNITY): Payer: Self-pay

## 2016-08-11 ENCOUNTER — Ambulatory Visit (HOSPITAL_COMMUNITY)
Admission: RE | Admit: 2016-08-11 | Discharge: 2016-08-11 | Disposition: A | Discharge status: Home / Self Care | Payer: Medicaid Other | Source: Ambulatory Visit | Attending: Obstetrics and Gynecology | Admitting: Obstetrics and Gynecology

## 2016-08-11 DIAGNOSIS — O4443 Low lying placenta NOS or without hemorrhage, third trimester: Secondary | ICD-10-CM | POA: Diagnosis not present

## 2016-08-11 DIAGNOSIS — Z3480 Encounter for supervision of other normal pregnancy, unspecified trimester: Secondary | ICD-10-CM

## 2016-08-11 DIAGNOSIS — Z3A33 33 weeks gestation of pregnancy: Secondary | ICD-10-CM | POA: Diagnosis not present

## 2016-08-11 DIAGNOSIS — O09293 Supervision of pregnancy with other poor reproductive or obstetric history, third trimester: Secondary | ICD-10-CM | POA: Diagnosis present

## 2016-08-13 ENCOUNTER — Ambulatory Visit (INDEPENDENT_AMBULATORY_CARE_PROVIDER_SITE_OTHER): Payer: Self-pay | Admitting: Obstetrics and Gynecology

## 2016-08-13 VITALS — BP 131/66 | HR 66 | Wt 192.7 lb

## 2016-08-13 DIAGNOSIS — Z3480 Encounter for supervision of other normal pregnancy, unspecified trimester: Secondary | ICD-10-CM

## 2016-08-13 DIAGNOSIS — Z3483 Encounter for supervision of other normal pregnancy, third trimester: Secondary | ICD-10-CM

## 2016-08-13 NOTE — Progress Notes (Signed)
   PRENATAL VISIT NOTE  Subjective:  Tonya Yates is a 30 y.o. [phone] at 7424w1d being seen today for ongoing prenatal care.  She is currently monitored for the following issues for this high-risk pregnancy and has Depression, major, single episode, severe (HCC); Suicidal ideations; Alcohol abuse; Suicidal ideation; Major depressive disorder, recurrent episode, severe with anxious distress (HCC); Supervision of other normal pregnancy, antepartum; Alcohol use affecting pregnancy in second trimester, antepartum; and Low lying placenta, antepartum on her problem list.  Patient reports painful contractions since last night and worst this morning.  Contractions: Irregular. Vag. Bleeding: None.  Movement: Present. Denies leaking of fluid.   The following portions of the patient's history were reviewed and updated as appropriate: allergies, current medications, past family history, past medical history, past social history, past surgical history and problem list. Problem list updated.  Objective:   Vitals:   08/13/16 1358  BP: 131/66  Pulse: 66  Weight: 192 lb 11.2 oz (87.4 kg)    Fetal Status: Fetal Heart Rate (bpm): 148 Fundal Height: 34 cm Movement: Present     General:  Alert, oriented and cooperative. Patient is in no acute distress.  Skin: Skin is warm and dry. No rash noted.   Cardiovascular: Normal heart rate noted  Respiratory: Normal respiratory effort, no problems with respiration noted  Abdomen: Soft, gravid, appropriate for gestational age. Pain/Pressure: Present     Pelvic:  Cervical exam performed Dilation: Fingertip Effacement (%): Thick Station: Ballotable  Extremities: Normal range of motion.  Edema: Trace  Mental Status: Normal mood and affect. Normal behavior. Normal judgment and thought content.   Assessment and Plan:  Pregnancy: [id number] at 3424w1d  1. Supervision of other normal pregnancy, antepartum Patient is doing well Reassurance provided Recent ultrasound report  reviewed with the patient which demonstrates the resolution of low lying placenta  Preterm labor symptoms and general obstetric precautions including but not limited to vaginal bleeding, contractions, leaking of fluid and fetal movement were reviewed in detail with the patient. Please refer to After Visit Summary for other counseling recommendations.  Return in about 2 weeks (around 08/27/2016).   Catalina AntiguaPeggy Lazlo Tunney, MD

## 2016-08-13 NOTE — Progress Notes (Signed)
Pt reports having painful contractions and pelvic pain.

## 2016-08-20 ENCOUNTER — Inpatient Hospital Stay (HOSPITAL_COMMUNITY)
Admission: AD | Admit: 2016-08-20 | Discharge: 2016-08-20 | Disposition: A | Discharge status: Home / Self Care | Payer: Medicaid Other | Source: Ambulatory Visit | Attending: Obstetrics and Gynecology | Admitting: Obstetrics and Gynecology

## 2016-08-20 ENCOUNTER — Encounter (HOSPITAL_COMMUNITY): Payer: Self-pay | Admitting: *Deleted

## 2016-08-20 DIAGNOSIS — F419 Anxiety disorder, unspecified: Secondary | ICD-10-CM | POA: Insufficient documentation

## 2016-08-20 DIAGNOSIS — K219 Gastro-esophageal reflux disease without esophagitis: Secondary | ICD-10-CM | POA: Diagnosis not present

## 2016-08-20 DIAGNOSIS — F329 Major depressive disorder, single episode, unspecified: Secondary | ICD-10-CM | POA: Insufficient documentation

## 2016-08-20 DIAGNOSIS — Z87891 Personal history of nicotine dependence: Secondary | ICD-10-CM | POA: Insufficient documentation

## 2016-08-20 DIAGNOSIS — E860 Dehydration: Secondary | ICD-10-CM | POA: Insufficient documentation

## 2016-08-20 DIAGNOSIS — O4700 False labor before 37 completed weeks of gestation, unspecified trimester: Secondary | ICD-10-CM

## 2016-08-20 DIAGNOSIS — O99283 Endocrine, nutritional and metabolic diseases complicating pregnancy, third trimester: Secondary | ICD-10-CM | POA: Diagnosis not present

## 2016-08-20 DIAGNOSIS — Z3A35 35 weeks gestation of pregnancy: Secondary | ICD-10-CM | POA: Diagnosis not present

## 2016-08-20 DIAGNOSIS — O99613 Diseases of the digestive system complicating pregnancy, third trimester: Secondary | ICD-10-CM | POA: Insufficient documentation

## 2016-08-20 DIAGNOSIS — O99343 Other mental disorders complicating pregnancy, third trimester: Secondary | ICD-10-CM | POA: Insufficient documentation

## 2016-08-20 DIAGNOSIS — O479 False labor, unspecified: Secondary | ICD-10-CM

## 2016-08-20 DIAGNOSIS — Z79899 Other long term (current) drug therapy: Secondary | ICD-10-CM | POA: Insufficient documentation

## 2016-08-20 HISTORY — DX: Anemia, unspecified: D64.9

## 2016-08-20 LAB — URINALYSIS, ROUTINE W REFLEX MICROSCOPIC
BILIRUBIN URINE: NEGATIVE
Glucose, UA: NEGATIVE mg/dL
HGB URINE DIPSTICK: NEGATIVE
Ketones, ur: 15 mg/dL — AB
NITRITE: NEGATIVE
PROTEIN: NEGATIVE mg/dL
Specific Gravity, Urine: 1.02 (ref 1.005–1.030)
pH: 7 (ref 5.0–8.0)

## 2016-08-20 LAB — URINE MICROSCOPIC-ADD ON

## 2016-08-20 NOTE — MAU Provider Note (Signed)
History     CSN: [id number]  Arrival date and time: 08/20/16 [id number]   First Provider Initiated Contact with Patient 08/20/16 [phone]      Chief Complaint  Patient presents with  . Contractions   Patient is a 30 y/o G9P4 @ 35 wks and 1 day who presents with contractions from 1-4am last night. At this time she is no longer having contractions. She reports regular fetal movement and no vaginal bleeding or loss of fluids. She has no other complaints. She reports the contraction were mild and cramping like.     OB History    Gravida Para Term Preterm AB Living   9 5 4 1 3 4    SAB TAB Ectopic Multiple Live Births   2 1     4       Past Medical History:  Diagnosis Date  . Acid reflux   . Anemia   . Anxiety   . Depression   . Hypoglycemia     Past Surgical History:  Procedure Laterality Date  . EYE SURGERY      No family history on file.  Social History  Substance Use Topics  . Smoking status: Former Games developermoker  . Smokeless tobacco: Current User  . Alcohol use Yes     Comment: Patient reports she drinks alcohol 1x a week    Allergies: No Known Allergies  Prescriptions Prior to Admission  Medication Sig Dispense Refill Last Dose  . Prenatal Vit-Fe Fumarate-FA (PRENATAL MULTIVITAMIN) TABS tablet Take 1 tablet by mouth daily.   08/19/2016 at Unknown time  . acetaminophen (TYLENOL) 500 MG tablet Take 1,000 mg by mouth every 6 (six) hours as needed for mild pain, moderate pain or headache.   Not Taking at Unknown time  . hydrOXYzine (ATARAX/VISTARIL) 25 MG tablet Take 1 tablet (25 mg total) by mouth every 6 (six) hours. (Patient not taking: Reported on 08/13/2016) 12 tablet 0 Not Taking  . pantoprazole (PROTONIX) 40 MG tablet Take 1 tablet (40 mg total) by mouth daily. (Patient not taking: Reported on 08/13/2016) 1 tablet 2 Not Taking  . promethazine (PHENERGAN) 12.5 MG tablet Take 1 tablet (12.5 mg total) by mouth every 6 (six) hours as needed for nausea or vomiting. (Patient not  taking: Reported on 08/13/2016) 30 tablet 0 Not Taking    Review of Systems  Constitutional: Negative for chills and fever.  HENT: Negative for congestion and hearing loss.   Eyes: Negative for blurred vision and double vision.  Respiratory: Negative for cough and sputum production.   Cardiovascular: Negative for chest pain and palpitations.  Gastrointestinal: Positive for abdominal pain. Negative for constipation, diarrhea, heartburn, nausea and vomiting.  Genitourinary: Negative for dysuria, frequency and urgency.  Musculoskeletal: Negative for back pain and myalgias.  Skin: Negative for rash.  Neurological: Negative for dizziness and tingling.  Endo/Heme/Allergies: Does not bruise/bleed easily.   Physical Exam   Blood pressure 124/65, pulse 79, temperature 98.6 F (37 C), temperature source Oral, resp. rate 16, last menstrual period 12/27/2015.  Physical Exam  Constitutional: She is oriented to person, place, and time. She appears well-developed and well-nourished.  HENT:  Head: Atraumatic.  Genitourinary:  Genitourinary Comments: Cervix 1/thick/high  Musculoskeletal: Normal range of motion.  Neurological: She is alert and oriented to person, place, and time. No cranial nerve deficit.  Skin: Skin is dry.  Psychiatric: She has a normal mood and affect. Her behavior is normal.    MAU Course  Procedures  MDM In mau patient was  placed on the monitor. No contractions noted. Category 1 tracing. UA was run and showed mild dehydration but no sign of UTI. Patient given instructions to drink 6-8 bottles of water daily. Pateint voiced understanding and ok with D/C home.   Assessment and Plan  1/ Pre-term Contractions: resolved spontaneously 2. Dehydration: encourage PO hydration tolerating ginger ale hear.   Ernestina Pennaicholas Darlin Stenseth 08/20/2016, 9:54 AM

## 2016-08-20 NOTE — MAU Note (Signed)
C/o ucs this Am from 0100 til 0430; denies ?SROM but states that she3 has been having more vaginal discharge;

## 2016-08-20 NOTE — Discharge Instructions (Signed)
Dehydration, Adult °Dehydration is when there is not enough fluid or water in your body. This happens when you lose more fluids than you take in. Dehydration can range from mild to very bad. It should be treated right away to keep it from getting very bad. °Symptoms of mild dehydration may include: °· Thirst. °· Dry lips. °· Slightly dry mouth. °· Dry, warm skin. °· Dizziness. °Symptoms of moderate dehydration may include: °· Very dry mouth. °· Muscle cramps. °· Dark pee (urine). Pee may be the color of tea. °· Your body making less pee. °· Your eyes making fewer tears. °· Heartbeat that is uneven or faster than normal (palpitations). °· Headache. °· Light-headedness, especially when you stand up from sitting. °· Fainting (syncope). °Symptoms of very bad dehydration may include: °· Changes in skin, such as: °¨ Cold and clammy skin. °¨ Blotchy (mottled) or pale skin. °¨ Skin that does not quickly return to normal after being lightly pinched and let go (poor skin turgor). °· Changes in body fluids, such as: °¨ Feeling very thirsty. °¨ Your eyes making fewer tears. °¨ Not sweating when body temperature is high, such as in hot weather. °¨ Your body making very little pee. °· Changes in vital signs, such as: °¨ Weak pulse. °¨ Pulse that is more than 100 beats a minute when you are sitting still. °¨ Fast breathing. °¨ Low blood pressure. °· Other changes, such as: °¨ Sunken eyes. °¨ Cold hands and feet. °¨ Confusion. °¨ Lack of energy (lethargy). °¨ Trouble waking up from sleep. °¨ Short-term weight loss. °¨ Unconsciousness. °Follow these instructions at home: °· If told by your doctor, drink an ORS: °¨ Make an ORS by using instructions on the package. °¨ Start by drinking small amounts, about ½ cup (120 mL) every 5-10 minutes. °¨ Slowly drink more until you have had the amount that your doctor said to have. °· Drink enough clear fluid to keep your pee clear or pale yellow. If you were told to drink an ORS, finish the ORS  first, then start slowly drinking clear fluids. Drink fluids such as: °¨ Water. Do not drink only water by itself. Doing that can make the salt (sodium) level in your body get too low (hyponatremia). °¨ Ice chips. °¨ Fruit juice that you have added water to (diluted). °¨ Low-calorie sports drinks. °· Avoid: °¨ Alcohol. °¨ Drinks that have a lot of sugar. These include high-calorie sports drinks, fruit juice that does not have water added, and soda. °¨ Caffeine. °¨ Foods that are greasy or have a lot of fat or sugar. °· Take over-the-counter and prescription medicines only as told by your doctor. °· Do not take salt tablets. Doing that can make the salt level in your body get too high (hypernatremia). °· Eat foods that have minerals (electrolytes). Examples include bananas, oranges, potatoes, tomatoes, and spinach. °· Keep all follow-up visits as told by your doctor. This is important. °Contact a doctor if: °· You have belly (abdominal) pain that: °¨ Gets worse. °¨ Stays in one area (localizes). °· You have a rash. °· You have a stiff neck. °· You get angry or annoyed more easily than normal (irritability). °· You are more sleepy than normal. °· You have a harder time waking up than normal. °· You feel: °¨ Weak. °¨ Dizzy. °¨ Very thirsty. °· You have peed (urinated) only a small amount of very dark pee during 6-8 hours. °Get help right away if: °· You have symptoms of   very bad dehydration.  You cannot drink fluids without throwing up (vomiting).  Your symptoms get worse with treatment.  You have a fever.  You have a very bad headache.  You are throwing up or having watery poop (diarrhea) and it:  Gets worse.  Does not go away.  You have blood or something green (bile) in your throw-up.  You have blood in your poop (stool). This may cause poop to look black and tarry.  You have not peed in 6-8 hours.  You pass out (faint).  Your heart rate when you are sitting still is more than 100 beats a  minute.  You have trouble breathing. This information is not intended to replace advice given to you by your health care provider. Make sure you discuss any questions you have with your health care provider. Document Released: 07/19/2009 Document Revised: 04/11/2016 Document Reviewed: 11/16/2015 Elsevier Interactive Patient Education  2017 [employer]  Preterm Labor and Birth Information Pregnancy normally lasts 39-41 weeks. Preterm labor is when labor starts early. It starts before you have been pregnant for 37 whole weeks. What are the risk factors for preterm labor? Preterm labor is more likely to occur in women who:  Have an infection while pregnant.  Have a cervix that is short.  Have gone into preterm labor before.  Have had surgery on their cervix.  Are younger than age 31.  Are older than age 31.  Are African American.  Are pregnant with two or more babies.  Take street drugs while pregnant.  Smoke while pregnant.  Do not gain enough weight while pregnant.  Got pregnant right after another pregnancy. What are the symptoms of preterm labor? Symptoms of preterm labor include:  Cramps. The cramps may feel like the cramps some women get during their period. The cramps may happen with watery poop (diarrhea).  Pain in the belly (abdomen).  Pain in the lower back.  Regular contractions or tightening. It may feel like your belly is getting tighter.  Pressure in the lower belly that seems to get stronger.  More fluid (discharge) leaking from the vagina. The fluid may be watery or bloody.  Water breaking. Why is it important to notice signs of preterm labor? Babies who are born early may not be fully developed. They have a higher chance for:  Long-term heart problems.  Long-term lung problems.  Trouble controlling body systems, like breathing.  Bleeding in the brain.  A condition called cerebral palsy.  Learning difficulties.  Death. These risks are  highest for babies who are born before 34 weeks of pregnancy. How is preterm labor treated? Treatment depends on:  How long you were pregnant.  Your condition.  The health of your baby. Treatment may involve:  Having a stitch (suture) placed in your cervix. When you give birth, your cervix opens so the baby can come out. The stitch keeps the cervix from opening too soon.  Staying at the hospital.  Taking or getting medicines, such as:  Hormone medicines.  Medicines to stop contractions.  Medicines to help the babys lungs develop.  Medicines to prevent your baby from having cerebral palsy. What should I do if I am in preterm labor? If you think you are going into labor too soon, call your doctor right away. How can I prevent preterm labor?  Do not use any tobacco products.  Examples of these are cigarettes, chewing tobacco, and e-cigarettes.  If you need help quitting, ask your doctor.  Do not  use street drugs.  Do not use any medicines unless you ask your doctor if they are safe for you.  Talk with your doctor before taking any herbal supplements.  Make sure you gain enough weight.  Watch for infection. If you think you might have an infection, get it checked right away.  If you have gone into preterm labor before, tell your doctor. This information is not intended to replace advice given to you by your health care provider. Make sure you discuss any questions you have with your health care provider. Document Released: 12/19/2008 Document Revised: 03/04/2016 Document Reviewed: 02/13/2016 Elsevier Interactive Patient Education  2017 [employer]

## 2016-08-25 ENCOUNTER — Inpatient Hospital Stay (EMERGENCY_DEPARTMENT_HOSPITAL)
Admission: AD | Admit: 2016-08-25 | Discharge: 2016-08-25 | Disposition: A | Discharge status: Home / Self Care | Payer: Medicaid Other | Source: Ambulatory Visit | Attending: Family Medicine | Admitting: Family Medicine

## 2016-08-25 ENCOUNTER — Encounter (HOSPITAL_COMMUNITY): Payer: Self-pay | Admitting: *Deleted

## 2016-08-25 ENCOUNTER — Inpatient Hospital Stay (HOSPITAL_COMMUNITY)
Admission: AD | Admit: 2016-08-25 | Discharge: 2016-08-25 | Disposition: A | Discharge status: Home / Self Care | Payer: Medicaid Other | Source: Ambulatory Visit | Attending: Family Medicine | Admitting: Family Medicine

## 2016-08-25 DIAGNOSIS — Z87891 Personal history of nicotine dependence: Secondary | ICD-10-CM | POA: Insufficient documentation

## 2016-08-25 DIAGNOSIS — O479 False labor, unspecified: Secondary | ICD-10-CM

## 2016-08-25 DIAGNOSIS — O4693 Antepartum hemorrhage, unspecified, third trimester: Secondary | ICD-10-CM

## 2016-08-25 DIAGNOSIS — Z3A35 35 weeks gestation of pregnancy: Secondary | ICD-10-CM | POA: Insufficient documentation

## 2016-08-25 DIAGNOSIS — O9989 Other specified diseases and conditions complicating pregnancy, childbirth and the puerperium: Secondary | ICD-10-CM

## 2016-08-25 DIAGNOSIS — O99343 Other mental disorders complicating pregnancy, third trimester: Secondary | ICD-10-CM | POA: Insufficient documentation

## 2016-08-25 DIAGNOSIS — O26853 Spotting complicating pregnancy, third trimester: Secondary | ICD-10-CM | POA: Insufficient documentation

## 2016-08-25 DIAGNOSIS — F329 Major depressive disorder, single episode, unspecified: Secondary | ICD-10-CM | POA: Insufficient documentation

## 2016-08-25 DIAGNOSIS — M549 Dorsalgia, unspecified: Secondary | ICD-10-CM

## 2016-08-25 DIAGNOSIS — O4703 False labor before 37 completed weeks of gestation, third trimester: Secondary | ICD-10-CM | POA: Insufficient documentation

## 2016-08-25 DIAGNOSIS — O99891 Other specified diseases and conditions complicating pregnancy: Secondary | ICD-10-CM

## 2016-08-25 HISTORY — DX: Unspecified infectious disease: B99.9

## 2016-08-25 LAB — URINALYSIS, ROUTINE W REFLEX MICROSCOPIC
BILIRUBIN URINE: NEGATIVE
Bilirubin Urine: NEGATIVE
GLUCOSE, UA: NEGATIVE mg/dL
GLUCOSE, UA: NEGATIVE mg/dL
HGB URINE DIPSTICK: NEGATIVE
HGB URINE DIPSTICK: NEGATIVE
KETONES UR: 15 mg/dL — AB
KETONES UR: NEGATIVE mg/dL
LEUKOCYTES UA: NEGATIVE
LEUKOCYTES UA: NEGATIVE
Nitrite: NEGATIVE
Nitrite: NEGATIVE
PH: 7 (ref 5.0–8.0)
PH: 6.5 (ref 5.0–8.0)
Protein, ur: NEGATIVE mg/dL
Protein, ur: NEGATIVE mg/dL
Specific Gravity, Urine: 1.02 (ref 1.005–1.030)
Specific Gravity, Urine: <1.005 — ABNORMAL LOW (ref 1.005–1.030)

## 2016-08-25 MED ORDER — CYCLOBENZAPRINE HCL 10 MG PO TABS
10.0000 mg | ORAL_TABLET | Freq: Two times a day (BID) | ORAL | 0 refills | Status: DC | PRN
Start: 2016-08-25 — End: 2016-08-25

## 2016-08-25 NOTE — MAU Note (Signed)
Pt was seen earlier today for labor and was not in labor. She now c/o brown mucous, dizziness and being tired.  Denies any vag leaking and reports baby moving well.

## 2016-08-25 NOTE — Discharge Instructions (Signed)
Third Trimester of Pregnancy The third trimester is from week 29 through week 40 (months 7 through 9). The third trimester is a time when the unborn baby (fetus) is growing rapidly. At the end of the ninth month, the fetus is about 20 inches in length and weighs 6-10 pounds. Body changes during your third trimester Your body goes through many changes during pregnancy. The changes vary from woman to woman. During the third trimester:  Your weight will continue to increase. You can expect to gain 25-35 pounds (11-16 kg) by the end of the pregnancy.  You may begin to get stretch marks on your hips, abdomen, and breasts.  You may urinate more often because the fetus is moving lower into your pelvis and pressing on your bladder.  You may develop or continue to have heartburn. This is caused by increased hormones that slow down muscles in the digestive tract.  You may develop or continue to have constipation because increased hormones slow digestion and cause the muscles that push waste through your intestines to relax.  You may develop hemorrhoids. These are swollen veins (varicose veins) in the rectum that can itch or be painful.  You may develop swollen, bulging veins (varicose veins) in your legs.  You may have increased body aches in the pelvis, back, or thighs. This is due to weight gain and increased hormones that are relaxing your joints.  You may have changes in your hair. These can include thickening of your hair, rapid growth, and changes in texture. Some women also have hair loss during or after pregnancy, or hair that feels dry or thin. Your hair will most likely return to normal after your baby is born.  Your breasts will continue to grow and they will continue to become tender. A yellow fluid (colostrum) may leak from your breasts. This is the first milk you are producing for your baby.  Your belly button may stick out.  You may notice more swelling in your hands, face, or  ankles.  You may have increased tingling or numbness in your hands, arms, and legs. The skin on your belly may also feel numb.  You may feel short of breath because of your expanding uterus.  You may have more problems sleeping. This can be caused by the size of your belly, increased need to urinate, and an increase in your body's metabolism.  You may notice the fetus "dropping," or moving lower in your abdomen.  You may have increased vaginal discharge.  Your cervix becomes thin and soft (effaced) near your due date. What to expect at prenatal visits You will have prenatal exams every 2 weeks until week 36. Then you will have weekly prenatal exams. During a routine prenatal visit:  You will be weighed to make sure you and the fetus are growing normally.  Your blood pressure will be taken.  Your abdomen will be measured to track your baby's growth.  The fetal heartbeat will be listened to.  Any test results from the previous visit will be discussed.  You may have a cervical check near your due date to see if you have effaced. At around 36 weeks, your health care provider will check your cervix. At the same time, your health care provider will also perform a test on the secretions of the vaginal tissue. This test is to determine if a type of bacteria, Group B streptococcus, is present. Your health care provider will explain this further. Your health care provider may ask you:    What your birth plan is.  How you are feeling.  If you are feeling the baby move.  If you have had any abnormal symptoms, such as leaking fluid, bleeding, severe headaches, or abdominal cramping.  If you are using any tobacco products, including cigarettes, chewing tobacco, and electronic cigarettes.  If you have any questions. Other tests or screenings that may be performed during your third trimester include:  Blood tests that check for low iron levels (anemia).  Fetal testing to check the health,  activity level, and growth of the fetus. Testing is done if you have certain medical conditions or if there are problems during the pregnancy.  Nonstress test (NST). This test checks the health of your baby to make sure there are no signs of problems, such as the baby not getting enough oxygen. During this test, a belt is placed around your belly. The baby is made to move, and its heart rate is monitored during movement. What is false labor? False labor is a condition in which you feel small, irregular tightenings of the muscles in the womb (contractions) that eventually go away. These are called Braxton Hicks contractions. Contractions may last for hours, days, or even weeks before true labor sets in. If contractions come at regular intervals, become more frequent, increase in intensity, or become painful, you should see your health care provider. What are the signs of labor?  Abdominal cramps.  Regular contractions that start at 10 minutes apart and become stronger and more frequent with time.  Contractions that start on the top of the uterus and spread down to the lower abdomen and back.  Increased pelvic pressure and dull back pain.  A watery or bloody mucus discharge that comes from the vagina.  Leaking of amniotic fluid. This is also known as your "water breaking." It could be a slow trickle or a gush. Let your doctor know if it has a color or strange odor. If you have any of these signs, call your health care provider right away, even if it is before your due date. Follow these instructions at home: Eating and drinking  Continue to eat regular, healthy meals.  Do not eat:  Raw meat or meat spreads.  Unpasteurized milk or cheese.  Unpasteurized juice.  Store-made salad.  Refrigerated smoked seafood.  Hot dogs or deli meat, unless they are piping hot.  More than 6 ounces of albacore tuna a week.  Shark, swordfish, king mackerel, or tile fish.  Store-made salads.  Raw  sprouts, such as mung bean or alfalfa sprouts.  Take prenatal vitamins as told by your health care provider.  Take 1000 mg of calcium daily as told by your health care provider.  If you develop constipation:  Take over-the-counter or prescription medicines.  Drink enough fluid to keep your urine clear or pale yellow.  Eat foods that are high in fiber, such as fresh fruits and vegetables, whole grains, and beans.  Limit foods that are high in fat and processed sugars, such as fried and sweet foods. Activity  Exercise only as directed by your health care provider. Healthy pregnant women should aim for 2 hours and 30 minutes of moderate exercise per week. If you experience any pain or discomfort while exercising, stop.  Avoid heavy lifting.  Do not exercise in extreme heat or humidity, or at high altitudes.  Wear low-heel, comfortable shoes.  Practice good posture.  Do not travel far distances unless it is absolutely necessary and only with the approval   of your health care provider.  Wear your seat belt at all times while in a car, on a bus, or on a plane.  Take frequent breaks and rest with your legs elevated if you have leg cramps or low back pain.  Do not use hot tubs, steam rooms, or saunas.  You may continue to have sex unless your health care provider tells you otherwise. Lifestyle  Do not use any products that contain nicotine or tobacco, such as cigarettes and e-cigarettes. If you need help quitting, ask your health care provider.  Do not drink alcohol.  Do not use any medicinal herbs or unprescribed drugs. These chemicals affect the formation and growth of the baby.  If you develop varicose veins:  Wear support pantyhose or compression stockings as told by your healthcare provider.  Elevate your feet for 15 minutes, 3-4 times a day.  Wear a supportive maternity bra to help with breast tenderness. General instructions  Take over-the-counter and prescription  medicines only as told by your health care provider. There are medicines that are either safe or unsafe to take during pregnancy.  Take warm sitz baths to soothe any pain or discomfort caused by hemorrhoids. Use hemorrhoid cream or witch hazel if your health care provider approves.  Avoid cat litter boxes and soil used by cats. These carry germs that can cause birth defects in the baby. If you have a cat, ask someone to clean the litter box for you.  To prepare for the arrival of your baby:  Take prenatal classes to understand, practice, and ask questions about the labor and delivery.  Make a trial run to the hospital.  Visit the hospital and tour the maternity area.  Arrange for maternity or paternity leave through employers.  Arrange for family and friends to take care of pets while you are in the hospital.  Purchase a rear-facing car seat and make sure you know how to install it in your car.  Pack your hospital bag.  Prepare the baby's nursery. Make sure to remove all pillows and stuffed animals from the baby's crib to prevent suffocation.  Visit your dentist if you have not gone during your pregnancy. Use a soft toothbrush to brush your teeth and be gentle when you floss.  Keep all prenatal follow-up visits as told by your health care provider. This is important. Contact a health care provider if:  You are unsure if you are in labor or if your water has broken.  You become dizzy.  You have mild pelvic cramps, pelvic pressure, or nagging pain in your abdominal area.  You have lower back pain.  You have persistent nausea, vomiting, or diarrhea.  You have an unusual or bad smelling vaginal discharge.  You have pain when you urinate. Get help right away if:  You have a fever.  You are leaking fluid from your vagina.  You have spotting or bleeding from your vagina.  You have severe abdominal pain or cramping.  You have rapid weight loss or weight gain.  You have  shortness of breath with chest pain.  You notice sudden or extreme swelling of your face, hands, ankles, feet, or legs.  Your baby makes fewer than 10 movements in 2 hours.  You have severe headaches that do not go away with medicine.  You have vision changes. Summary  The third trimester is from week 29 through week 40, months 7 through 9. The third trimester is a time when the unborn baby (fetus)   is growing rapidly.  During the third trimester, your discomfort may increase as you and your baby continue to gain weight. You may have abdominal, leg, and back pain, sleeping problems, and an increased need to urinate.  During the third trimester your breasts will keep growing and they will continue to become tender. A yellow fluid (colostrum) may leak from your breasts. This is the first milk you are producing for your baby.  False labor is a condition in which you feel small, irregular tightenings of the muscles in the womb (contractions) that eventually go away. These are called Braxton Hicks contractions. Contractions may last for hours, days, or even weeks before true labor sets in.  Signs of labor can include: abdominal cramps; regular contractions that start at 10 minutes apart and become stronger and more frequent with time; watery or bloody mucus discharge that comes from the vagina; increased pelvic pressure and dull back pain; and leaking of amniotic fluid. This information is not intended to replace advice given to you by your health care provider. Make sure you discuss any questions you have with your health care provider. Document Released: 09/16/2001 Document Revised: 02/28/2016 Document Reviewed: 11/23/2012 Elsevier Interactive Patient Education  2017 [employer]  

## 2016-08-25 NOTE — MAU Provider Note (Signed)
History     CSN: [id number]  Arrival date and time: 08/25/16 2101   None     No chief complaint on file.  HPI [name] is a 30yo [id number] @ 35.6wks who presents for eval of brown, mucousy vag d/c followed MAU visit/cx exam earlier today. Her ctx have subsided some since earlier today; denies leaking or BRB. Also feels tired and dizzy at times. Her preg has been followed by the [facility] office and has been remarkable for 1) depression 2) EtOH on UDS  OB History    Gravida Para Term Preterm AB Living   9 5 4 1 3 4    SAB TAB Ectopic Multiple Live Births   2 1     4       Past Medical History:  Diagnosis Date  . Acid reflux   . Anemia   . Anxiety   . Depression   . Hypoglycemia   . Infection    UTI    Past Surgical History:  Procedure Laterality Date  . EYE SURGERY Left    stye removed    Family History  Problem Relation Age of Onset  . Diabetes Maternal Grandmother   . Hearing loss Neg Hx     Social History  Substance Use Topics  . Smoking status: Former Smoker    Types: Cigarettes  . Smokeless tobacco: Never Used  . Alcohol use Yes     Comment: last alcohol was 3 months ago    Allergies: No Known Allergies  Prescriptions Prior to Admission  Medication Sig Dispense Refill Last Dose  . acetaminophen (TYLENOL) 500 MG tablet Take 1,000 mg by mouth every 6 (six) hours as needed for mild pain, moderate pain or headache.   Past Month at Unknown time  . Prenatal Vit-Fe Fumarate-FA (PRENATAL MULTIVITAMIN) TABS tablet Take 1 tablet by mouth daily.   08/25/2016 at Unknown time  . cyclobenzaprine (FLEXERIL) 10 MG tablet Take 1 tablet (10 mg total) by mouth 2 (two) times daily as needed for muscle spasms. (Patient not taking: Reported on 08/25/2016) 20 tablet 0 Not Taking at Unknown time  . pantoprazole (PROTONIX) 40 MG tablet Take 1 tablet (40 mg total) by mouth daily. (Patient not taking: Reported on 08/25/2016) 1 tablet 2 Not Taking at Unknown time  . promethazine  (PHENERGAN) 12.5 MG tablet Take 1 tablet (12.5 mg total) by mouth every 6 (six) hours as needed for nausea or vomiting. (Patient not taking: Reported on 08/25/2016) 30 tablet 0 Not Taking at Unknown time    ROS  No other pertinents other than what is listed in HPI Physical Exam   Blood pressure 118/61, pulse 78, temperature 98.5 F (36.9 C), temperature source Oral, resp. rate 15, last menstrual period 12/27/2015.  Physical Exam  Constitutional: She is oriented to person, place, and time. She appears well-developed.  HENT:  Head: Normocephalic.  Neck: Normal range of motion.  Cardiovascular: Normal rate.   Respiratory: Effort normal.  GI:  EFM 150s, +accels, no decels Rare ctx  Genitourinary:  Genitourinary Comments: Cx per RN: 1+/thick (essentially unchanged from earlier)  Musculoskeletal: Normal range of motion.  Neurological: She is alert and oriented to person, place, and time.  Skin: Skin is warm and dry.  Psychiatric: She has a normal mood and affect. Her behavior is normal. Thought content normal.   Urinalysis    Component Value Date/Time   COLORURINE YELLOW 08/25/2016 2118   APPEARANCEUR CLEAR 08/25/2016 2118   LABSPEC <1.005 (L) 08/25/2016 2118  PHURINE 6.5 08/25/2016 2118   GLUCOSEU NEGATIVE 08/25/2016 2118   HGBUR NEGATIVE 08/25/2016 2118   BILIRUBINUR NEGATIVE 08/25/2016 2118   KETONESUR NEGATIVE 08/25/2016 2118   PROTEINUR NEGATIVE 08/25/2016 2118   UROBILINOGEN 0.2 07/24/2016 1503   NITRITE NEGATIVE 08/25/2016 2118   LEUKOCYTESUR NEGATIVE 08/25/2016 2118    MAU Course  Procedures  MDM UA ordered   Assessment and Plan  IUP@35 .6wks Brown spotting following exam  D/C home with PTL, bldg, ROM precautions Support offered Keep next scheduled visit   Cam HaiSHAW, Ameen Mostafa CNM 08/25/2016, 10:10 PM

## 2016-08-25 NOTE — Discharge Instructions (Signed)
Braxton Hicks Contractions °Contractions of the uterus can occur throughout pregnancy. Contractions are not always a sign that you are in labor.  °WHAT ARE BRAXTON HICKS CONTRACTIONS?  °Contractions that occur before labor are called Braxton Hicks contractions, or false labor. Toward the end of pregnancy (32-34 weeks), these contractions can develop more often and may become more forceful. This is not true labor because these contractions do not result in opening (dilatation) and thinning of the cervix. They are sometimes difficult to tell apart from true labor because these contractions can be forceful and people have different pain tolerances. You should not feel embarrassed if you go to the hospital with false labor. Sometimes, the only way to tell if you are in true labor is for your health care provider to look for changes in the cervix. °If there are no prenatal problems or other health problems associated with the pregnancy, it is completely safe to be sent home with false labor and await the onset of true labor. °HOW CAN YOU TELL THE DIFFERENCE BETWEEN TRUE AND FALSE LABOR? °False Labor  °· The contractions of false labor are usually shorter and not as hard as those of true labor.   °· The contractions are usually irregular.   °· The contractions are often felt in the front of the lower abdomen and in the groin.   °· The contractions may go away when you walk around or change positions while lying down.   °· The contractions get weaker and are shorter lasting as time goes on.   °· The contractions do not usually become progressively stronger, regular, and closer together as with true labor.   °True Labor  °· Contractions in true labor last 30-70 seconds, become very regular, usually become more intense, and increase in frequency.   °· The contractions do not go away with walking.   °· The discomfort is usually felt in the top of the uterus and spreads to the lower abdomen and low back.   °· True labor can be  determined by your health care provider with an exam. This will show that the cervix is dilating and getting thinner.   °WHAT TO REMEMBER °· Keep up with your usual exercises and follow other instructions given by your health care provider.   °· Take medicines as directed by your health care provider.   °· Keep your regular prenatal appointments.   °· Eat and drink lightly if you think you are going into labor.   °· If Braxton Hicks contractions are making you uncomfortable:   °¨ Change your position from lying down or resting to walking, or from walking to resting.   °¨ Sit and rest in a tub of warm water.   °¨ Drink 2-3 glasses of water. Dehydration may cause these contractions.   °¨ Do slow and deep breathing several times an hour.   °WHEN SHOULD I SEEK IMMEDIATE MEDICAL CARE? °Seek immediate medical care if: °· Your contractions become stronger, more regular, and closer together.   °· You have fluid leaking or gushing from your vagina.   °· You have a fever.   °· You pass blood-tinged mucus.   °· You have vaginal bleeding.   °· You have continuous abdominal pain.   °· You have low back pain that you never had before.   °· You feel your baby's head pushing down and causing pelvic pressure.   °· Your baby is not moving as much as it used to.   °This information is not intended to replace advice given to you by your health care provider. Make sure you discuss any questions you have with your health care   provider. °Document Released: 09/22/2005 Document Revised: 01/14/2016 Document Reviewed: 07/04/2013 °Elsevier Interactive Patient Education © 2017 [employer] Inc. ° °

## 2016-08-25 NOTE — MAU Provider Note (Signed)
History     CSN: [id number]  Arrival date and time: 08/25/16 1353   First Provider Initiated Contact with Patient 08/25/16 1429      Chief Complaint  Patient presents with  . Contractions   HPI   Tonya Yates is a 30 y.o. female [phone] @ 7331w6d here with contractions; she was having hard, painful contractions this afternoon and feels like when she arrived her contractions slowed down.  She is still having contractions however she does not feel like they are as strong.  She denies vaginal bleeding.   OB History    Gravida Para Term Preterm AB Living   9 5 4 1 3 4    SAB TAB Ectopic Multiple Live Births   2 1     4       Past Medical History:  Diagnosis Date  . Acid reflux   . Anemia   . Anxiety   . Depression   . Hypoglycemia   . Infection    UTI    Past Surgical History:  Procedure Laterality Date  . EYE SURGERY Left    stye removed    Family History  Problem Relation Age of Onset  . Diabetes Maternal Grandmother   . Hearing loss Neg Hx     Social History  Substance Use Topics  . Smoking status: Former Smoker    Types: Cigarettes  . Smokeless tobacco: Never Used  . Alcohol use Yes     Comment: last alcohol was 3 months ago    Allergies: No Known Allergies  Prescriptions Prior to Admission  Medication Sig Dispense Refill Last Dose  . acetaminophen (TYLENOL) 500 MG tablet Take 1,000 mg by mouth every 6 (six) hours as needed for mild pain, moderate pain or headache.   Past Month at Unknown time  . Prenatal Vit-Fe Fumarate-FA (PRENATAL MULTIVITAMIN) TABS tablet Take 1 tablet by mouth daily.   08/25/2016 at Unknown time  . hydrOXYzine (ATARAX/VISTARIL) 25 MG tablet Take 1 tablet (25 mg total) by mouth every 6 (six) hours. (Patient not taking: Reported on 08/25/2016) 12 tablet 0 Not Taking at Unknown time  . pantoprazole (PROTONIX) 40 MG tablet Take 1 tablet (40 mg total) by mouth daily. (Patient not taking: Reported on 08/25/2016) 1 tablet 2 Not Taking  at Unknown time  . promethazine (PHENERGAN) 12.5 MG tablet Take 1 tablet (12.5 mg total) by mouth every 6 (six) hours as needed for nausea or vomiting. (Patient not taking: Reported on 08/25/2016) 30 tablet 0 Not Taking at Unknown time   Results for orders placed or performed during the hospital encounter of 08/25/16 (from the past 48 hour(s))  Urinalysis, Routine w reflex microscopic ([facility]     Status: Abnormal   Collection Time: 08/25/16  2:07 PM  Result Value Ref Range   Color, Urine YELLOW YELLOW   APPearance CLEAR CLEAR   Specific Gravity, Urine 1.020 1.005 - 1.030   pH 7.0 5.0 - 8.0   Glucose, UA NEGATIVE NEGATIVE mg/dL   Hgb urine dipstick NEGATIVE NEGATIVE   Bilirubin Urine NEGATIVE NEGATIVE   Ketones, ur 15 (A) NEGATIVE mg/dL   Protein, ur NEGATIVE NEGATIVE mg/dL   Nitrite NEGATIVE NEGATIVE   Leukocytes, UA NEGATIVE NEGATIVE    Comment: MICROSCOPIC NOT DONE ON URINES WITH NEGATIVE PROTEIN, BLOOD, LEUKOCYTES, NITRITE, OR GLUCOSE <1000 mg/dL.    Review of Systems  Constitutional: Negative for chills and fever.  Gastrointestinal: Negative for nausea and vomiting.  Musculoskeletal: Positive for back pain.  Physical Exam   Blood pressure 130/65, pulse 91, temperature 97.8 F (36.6 C), temperature source Oral, resp. rate 16, weight 192 lb 9.6 oz (87.4 kg), last menstrual period 12/27/2015.  Physical Exam  Constitutional: She is oriented to person, place, and time. She appears well-developed and well-nourished. No distress.  HENT:  Head: Normocephalic.  Eyes: Pupils are equal, round, and reactive to light.  Respiratory: Effort normal.  GI: Soft. She exhibits no distension. There is no tenderness. There is no rebound and no guarding.  Genitourinary:  Genitourinary Comments: Vagina - Small amount of white vaginal discharge, no odor  Cervix - No contact bleeding, no active bleeding  Bimanual exam: Cervix closed Uterus non tender, normal size Adnexa non tender, no  masses bilaterally GC/Chlam, wet prep done Chaperone present for exam.   Musculoskeletal: Normal range of motion.  Neurological: She is alert and oriented to person, place, and time.  Skin: Skin is warm. She is not diaphoretic.  Psychiatric: Her behavior is normal.   Fetal Tracing: Baseline: 130 bpm  Variability: Moderate  Accelerations: 15x15 Decelerations: One quick variable  Toco: irregular pattern .  MAU Course  Procedures  None  MDM  UA   Assessment and Plan   A:  1. Braxton Hicks contractions   2. Back pain affecting pregnancy in third trimester     P:  Discharge home in stable condition Strict return precautions Rx: flexeril  Increase PO fluids.    Duane LopeJennifer I Juddson Cobern, NP 08/25/2016 2:34 PM

## 2016-08-25 NOTE — MAU Note (Signed)
Started contracting this morning, became regular around 1. Now every 3 min.  Had some bright red spotting this morning.

## 2016-08-29 ENCOUNTER — Encounter (HOSPITAL_COMMUNITY): Payer: Self-pay | Admitting: *Deleted

## 2016-08-29 ENCOUNTER — Inpatient Hospital Stay (HOSPITAL_COMMUNITY)
Admission: AD | Admit: 2016-08-29 | Discharge: 2016-08-29 | Disposition: A | Discharge status: Home / Self Care | Payer: Medicaid Other | Source: Ambulatory Visit | Attending: Obstetrics & Gynecology | Admitting: Obstetrics & Gynecology

## 2016-08-29 DIAGNOSIS — R300 Dysuria: Secondary | ICD-10-CM | POA: Diagnosis not present

## 2016-08-29 DIAGNOSIS — Z79899 Other long term (current) drug therapy: Secondary | ICD-10-CM | POA: Insufficient documentation

## 2016-08-29 DIAGNOSIS — H538 Other visual disturbances: Secondary | ICD-10-CM | POA: Diagnosis not present

## 2016-08-29 DIAGNOSIS — R42 Dizziness and giddiness: Secondary | ICD-10-CM | POA: Diagnosis not present

## 2016-08-29 DIAGNOSIS — R110 Nausea: Secondary | ICD-10-CM

## 2016-08-29 DIAGNOSIS — Z87891 Personal history of nicotine dependence: Secondary | ICD-10-CM | POA: Insufficient documentation

## 2016-08-29 LAB — URINE MICROSCOPIC-ADD ON

## 2016-08-29 LAB — URINALYSIS, ROUTINE W REFLEX MICROSCOPIC
Bilirubin Urine: NEGATIVE
Glucose, UA: NEGATIVE mg/dL
Hgb urine dipstick: NEGATIVE
KETONES UR: NEGATIVE mg/dL
LEUKOCYTES UA: NEGATIVE
NITRITE: POSITIVE — AB
PH: 7 (ref 5.0–8.0)
PROTEIN: NEGATIVE mg/dL
Specific Gravity, Urine: 1.02 (ref 1.005–1.030)

## 2016-08-29 LAB — CBC
HCT: 31.7 % — ABNORMAL LOW (ref 36.0–46.0)
Hemoglobin: 10.6 g/dL — ABNORMAL LOW (ref 12.0–15.0)
MCH: 29.4 pg (ref 26.0–34.0)
MCHC: 33.4 g/dL (ref 30.0–36.0)
MCV: 87.8 fL (ref 78.0–100.0)
PLATELETS: 230 10*3/uL (ref 150–400)
RBC: 3.61 MIL/uL — ABNORMAL LOW (ref 3.87–5.11)
RDW: 14 % (ref 11.5–15.5)
WBC: 13.6 10*3/uL — ABNORMAL HIGH (ref 4.0–10.5)

## 2016-08-29 LAB — COMPREHENSIVE METABOLIC PANEL
ALT: 10 U/L — ABNORMAL LOW (ref 14–54)
ANION GAP: 6 (ref 5–15)
AST: 14 U/L — ABNORMAL LOW (ref 15–41)
Albumin: 3 g/dL — ABNORMAL LOW (ref 3.5–5.0)
Alkaline Phosphatase: 104 U/L (ref 38–126)
BUN: 7 mg/dL (ref 6–20)
CALCIUM: 9.9 mg/dL (ref 8.9–10.3)
CHLORIDE: 105 mmol/L (ref 101–111)
CO2: 24 mmol/L (ref 22–32)
Creatinine, Ser: 0.49 mg/dL (ref 0.44–1.00)
GFR calc non Af Amer: >60 mL/min (ref >60)
Glucose, Bld: 110 mg/dL — ABNORMAL HIGH (ref 65–99)
Potassium: 3.5 mmol/L (ref 3.5–5.1)
SODIUM: 135 mmol/L (ref 135–145)
Total Bilirubin: 0.4 mg/dL (ref 0.3–1.2)
Total Protein: 5.9 g/dL — ABNORMAL LOW (ref 6.5–8.1)

## 2016-08-29 LAB — PROTEIN / CREATININE RATIO, URINE
Creatinine, Urine: 125 mg/dL
PROTEIN CREATININE RATIO: 0.1 mg/mg{creat} (ref 0.00–0.15)
TOTAL PROTEIN, URINE: 13 mg/dL

## 2016-08-29 MED ORDER — HYPROMELLOSE (GONIOSCOPIC) 2.5 % OP SOLN: 1.0000 [drp] | Freq: Four times a day (QID) | OPHTHALMIC | 12 refills | Status: AC

## 2016-08-29 NOTE — Discharge Instructions (Signed)
Dry Eye Introduction Dry eye, also called keratoconjunctivitis sicca, is dryness of the membranes surrounding the eye. It happens when there are not enough healthy, natural tears in the eyes. The eyes must remain moist at all times. A small amount of tears is constantly produced by the tear glands (lacrimal glands). These glands are located under the outside part of the upper eyelids. Dryness of the eyes can be a symptom of a variety of conditions, such as rheumatoid arthritis, lupus, or Sjgren syndrome. Dry eye may be mild to severe. What are the causes? This condition may be caused by:  Not making enough tears (aqueous tear-deficient dry eyes).  Tears evaporating from the eye too quickly (evaporative dry eyes). This is when there is an abnormality in the quality of your tears. This abnormality causes your tears to evaporate so quickly that the eye cannot be kept moist. What increases the risk? This condition is more likely to happen in:  Women, especially those who have gone through menopause.  People in dry climates.  People in dusty or smoky areas.  People who take certain medicines, such as:  Anti-allergy medicines (antihistamines).  Blood pressure medicines (antihypertensives).  Birth control pills (oral contraceptives).  Laxatives.  Tranquilizers. What are the signs or symptoms? Symptoms in the eyes may include:  Irritation.  Itchiness.  Redness.  Burning.  Inflammation of the eyelids.  Feeling as though something is stuck in the eye.  Light sensitivity.  Increased sensitivity and discomfort when wearing contact lenses, if this applies.  Vision that varies throughout the day.  Occasional excessive tearing. How is this diagnosed? This condition is diagnosed based on your symptoms, your medical history, and an eye exam. Your health care provider may look at your eye using a microscope and may put dyes in your eye to check the health of the surface of your  eye. You may have a tests, such as a test to evaluate your tear production (Schirmer test). During this test, a small strip of special paper is gently pressed into the inner corner of your eye. Your tear production is measured by how much of the paper is moistened by your tears during a set amount of time. You may be referred to a health care provider who specializes in eyes and eyesight (ophthalmologist). How is this treated? This condition is often treated at home. Your health care provider may recommend eye drops, which are also called artificial tears. If your condition is severe, treatment may include:  Prescription eye drops.  Over-the-counter or prescription ointments to moisten your eyes.  Minor surgery to block tears from going into your nose.  Medicines to reduce inflammation of the eyelids. Follow these instructions at home:  Take, use, or apply over-the-counter and prescription medicines only as told by your health care provider. This includes eye drops.  If directed, apply a warm compress to your eyes to help reduce inflammation. Place a towel over your eyes and gently press the warm compress over your eyes for about 5 minutes, or as long as told by your health care provider.  If possible, avoid dry, drafty environments.  Use a humidifier at home to increase moisture in the air.  If you wear contact lenses, remove them regularly to give your eyes a break. Always remove contacts before sleeping.  Keep all follow-up visits as told by your health care provider. This is important. This includes yearly eye exams and vision tests. Contact a health care provider if:  You have eye  pain.  You have pus-like fluid coming from your eye.  Your symptoms get worse or do not improve with treatment. Get help right away if:  Your vision suddenly changes. This information is not intended to replace advice given to you by your health care provider. Make sure you discuss any questions you  have with your health care provider. Document Released: 08/09/2004 Document Revised: 02/28/2016 Document Reviewed: 07/18/2015  2017 [employer]vier

## 2016-08-29 NOTE — MAU Provider Note (Signed)
History     CSN: [id number]  Arrival date and time: 08/29/16 1549   First Provider Initiated Contact with Patient 08/29/16 1655      Chief Complaint  Patient presents with  . Dizziness  . nausea  . Blurred Vision   Patient is a 30 year old female G9 P4 who presents today with blurred vision. She reports her blurred vision was significantly worse yesterday. At that time she took out her update contact lenses that do not have any magnification but are just colored and put in some eyedrops. She called the nursing line and they recommended she come in. She could not get a ride as it was Thanksgiving so she presents today. She reports that her blurred vision has improved significantly since yesterday but is still there somewhat. She denies headache or right upper quadrant pain. Patient is also complaining on some intermittent dysuria.    OB History    Gravida Para Term Preterm AB Living   9 5 4 1 3 4    SAB TAB Ectopic Multiple Live Births   2 1     4       Past Medical History:  Diagnosis Date  . Acid reflux   . Anemia   . Anxiety   . Depression   . Hypoglycemia   . Infection    UTI    Past Surgical History:  Procedure Laterality Date  . EYE SURGERY Left    stye removed    Family History  Problem Relation Age of Onset  . Diabetes Maternal Grandmother   . Hearing loss Neg Hx     Social History  Substance Use Topics  . Smoking status: Former Smoker    Types: Cigarettes  . Smokeless tobacco: Never Used  . Alcohol use Yes     Comment: last alcohol was 3 months ago    Allergies: No Known Allergies  Prescriptions Prior to Admission  Medication Sig Dispense Refill Last Dose  . acetaminophen (TYLENOL) 500 MG tablet Take 1,000 mg by mouth every 6 (six) hours as needed for mild pain, moderate pain or headache.   Past Month at Unknown time  . Prenatal Vit-Fe Fumarate-FA (PRENATAL MULTIVITAMIN) TABS tablet Take 1 tablet by mouth daily.   08/25/2016 at Unknown time     Review of Systems  Constitutional: Negative for chills and fever.  HENT: Negative for congestion and sore throat.   Eyes: Positive for blurred vision and double vision. Negative for pain, discharge and redness.  Respiratory: Negative for cough and shortness of breath.   Cardiovascular: Negative for chest pain and orthopnea.  Gastrointestinal: Positive for abdominal pain. Negative for constipation, diarrhea, heartburn, nausea and vomiting.  Genitourinary: Negative for dysuria and urgency.  Musculoskeletal: Negative for myalgias and neck pain.  Skin: Negative for itching and rash.  Neurological: Negative for dizziness and headaches.  Endo/Heme/Allergies: Does not bruise/bleed easily.   Physical Exam   Blood pressure 123/59, pulse 77, temperature 97.5 F (36.4 C), temperature source Oral, resp. rate 16, last menstrual period 12/27/2015, SpO2 100 %.  Physical Exam  Constitutional: She is oriented to person, place, and time. She appears well-developed and well-nourished.  Eyes: Conjunctivae and EOM are normal. Pupils are equal, round, and reactive to light. Right eye exhibits no discharge. Left eye exhibits no discharge. No scleral icterus.  Neck: Normal range of motion.  Cardiovascular: Normal rate and intact distal pulses.   No murmur heard. Respiratory: Effort normal and breath sounds normal. No respiratory distress.  GI: Soft. Bowel  sounds are normal. She exhibits no distension. There is no tenderness. There is no rebound and no guarding.  Musculoskeletal: Normal range of motion. She exhibits no edema.  Neurological: She is alert and oriented to person, place, and time. No cranial nerve deficit. Coordination normal.  Skin: Skin is warm and dry. No erythema.  Psychiatric: She has a normal mood and affect. Her behavior is normal.    MAU Course  Procedures  MDM In MAU patient was evaluated for preeclampsia given her blurred vision and borderline blood pressures. She had a  systolic of 139 in MAU.  Labs where reviewed and are negative. The remainder of patients BP where wnl.    Assessment and Plan  1. Blurred vision: likely due to irritation from contacts. Advised use visine or artificial tears. PIH work up negative 2. Nitrite positive urine: LE negative patient reports occasional dysuria, will await culture to treat.   Ernestina Pennaicholas Dreon Pineda 08/29/2016, 4:57 PM

## 2016-08-29 NOTE — MAU Note (Signed)
Patient states that she started having blurred vision yesterday as well as dizziness and feeling tired.  She called the on call nurse who told her to come to MAU to be evaluated, but patient states she did not have a ride.  She states the blurred vision is still there, but better today.  Denies nausea today.  Reports feeling +fetal movement.  Denies vaginal bleeding, LOF, or abnormal discharge.

## 2016-08-30 LAB — CULTURE, OB URINE

## 2016-09-02 ENCOUNTER — Ambulatory Visit (INDEPENDENT_AMBULATORY_CARE_PROVIDER_SITE_OTHER): Payer: Medicaid Other | Admitting: Obstetrics and Gynecology

## 2016-09-02 ENCOUNTER — Other Ambulatory Visit (HOSPITAL_COMMUNITY)
Admission: RE | Admit: 2016-09-02 | Discharge: 2016-09-02 | Disposition: A | Discharge status: Home / Self Care | Payer: Medicaid Other | Source: Ambulatory Visit | Attending: Obstetrics and Gynecology | Admitting: Obstetrics and Gynecology

## 2016-09-02 DIAGNOSIS — O4440 Low lying placenta NOS or without hemorrhage, unspecified trimester: Secondary | ICD-10-CM

## 2016-09-02 DIAGNOSIS — Z113 Encounter for screening for infections with a predominantly sexual mode of transmission: Secondary | ICD-10-CM | POA: Insufficient documentation

## 2016-09-02 DIAGNOSIS — Z3480 Encounter for supervision of other normal pregnancy, unspecified trimester: Secondary | ICD-10-CM

## 2016-09-02 DIAGNOSIS — Z3483 Encounter for supervision of other normal pregnancy, third trimester: Secondary | ICD-10-CM

## 2016-09-02 DIAGNOSIS — O4443 Low lying placenta NOS or without hemorrhage, third trimester: Secondary | ICD-10-CM

## 2016-09-02 LAB — OB RESULTS CONSOLE GBS: GBS: NEGATIVE

## 2016-09-02 LAB — OB RESULTS CONSOLE GC/CHLAMYDIA: Gonorrhea: NEGATIVE

## 2016-09-02 NOTE — Progress Notes (Signed)
   PRENATAL VISIT NOTE  Subjective:  Tonya Yates is a 30 y.o. [phone] [id number] at [redacted]w[redacted]d being seen today for ongoing prenatal care.  She is currently monitored for the following issues for this high-risk pregnancy and has Depression, major, single episode, severe (HCC); Suicidal ideations; Alcohol abuse; Suicidal ideation; Major depressive disorder, recurrent episode, severe with anxious distress (HCC); Supervision of other normal pregnancy, antepartum; Alcohol use affecting pregnancy in second trimester, antepartum; and Low lying placenta, antepartum on her problem list.  Patient reports no complaints.  Contractions: Irregular.  .  Movement: Present. Denies leaking of fluid.   The following portions of the patient's history were reviewed and updated as appropriate: allergies, current medications, past family history, past medical history, past social history, past surgical history and problem list. Problem list updated.  Objective:   Vitals:   09/02/16 1557  BP: 127/67  Pulse: 81  Weight: 198 lb (89.8 kg)    Fetal Status: Fetal Heart Rate (bpm): 154 Fundal Height: 37 cm Movement: Present  Presentation: Vertex  General:  Alert, oriented and cooperative. Patient is in no acute distress.  Skin: Skin is warm and dry. No rash noted.   Cardiovascular: Normal heart rate noted  Respiratory: Normal respiratory effort, no problems with respiration noted  Abdomen: Soft, gravid, appropriate for gestational age. Pain/Pressure: Present     Pelvic:  Cervical exam performed Dilation: 1 Effacement (%): Thick Station: Ballotable  Extremities: Normal range of motion.  Edema: Trace  Mental Status: Normal mood and affect. Normal behavior. Normal judgment and thought content.   Assessment and Plan:  Pregnancy: [id number] at 478w0d  1. Supervision of other normal pregnancy, antepartum Patient is doing well without complaints Cultures collected today Patient still has not changed her medicaid to [place]. She understands  that BTL will have to be done 6 weeks pp - Culture, beta strep (group b only) - GC/Chlamydia probe amp ([place])not at [facility]  2. Low lying placenta, antepartum Resolved  Term labor symptoms and general obstetric precautions including but not limited to vaginal bleeding, contractions, leaking of fluid and fetal movement were reviewed in detail with the patient. Please refer to After Visit Summary for other counseling recommendations.  Return in about 1 week (around 09/09/2016).   Catalina AntiguaPeggy Zeriah Baysinger, MD

## 2016-09-03 LAB — GC/CHLAMYDIA PROBE AMP (~~LOC~~) NOT AT ARMC
CHLAMYDIA, DNA PROBE: NEGATIVE
NEISSERIA GONORRHEA: NEGATIVE

## 2016-09-05 LAB — CULTURE, BETA STREP (GROUP B ONLY)

## 2016-09-06 ENCOUNTER — Inpatient Hospital Stay (HOSPITAL_COMMUNITY)
Admission: AD | Admit: 2016-09-06 | Discharge: 2016-09-07 | Disposition: A | Discharge status: Home / Self Care | Payer: Medicaid Other | Source: Ambulatory Visit | Attending: Family Medicine | Admitting: Family Medicine

## 2016-09-06 DIAGNOSIS — Z3A37 37 weeks gestation of pregnancy: Secondary | ICD-10-CM | POA: Insufficient documentation

## 2016-09-07 ENCOUNTER — Encounter (HOSPITAL_COMMUNITY): Payer: Self-pay | Admitting: Certified Nurse Midwife

## 2016-09-07 DIAGNOSIS — Z3A37 37 weeks gestation of pregnancy: Secondary | ICD-10-CM | POA: Diagnosis not present

## 2016-09-07 NOTE — Progress Notes (Signed)
Written and verbal d/c instructions given and understanding voiced. Pt alittle frustrated with having to go home. Explained even though having ctxs her cervix is not changing and not time for admission.

## 2016-09-07 NOTE — Progress Notes (Signed)
Dr Genevie AnnSchenk notified of sve reck of no change. Pt stable for d/c home.

## 2016-09-07 NOTE — MAU Note (Signed)
Cotnractions for couple hours. Denies LOF or bleeding. 1.5cm last sve

## 2016-09-07 NOTE — Discharge Instructions (Signed)
Braxton Hicks Contractions °Contractions of the uterus can occur throughout pregnancy. Contractions are not always a sign that you are in labor.  °WHAT ARE BRAXTON HICKS CONTRACTIONS?  °Contractions that occur before labor are called Braxton Hicks contractions, or false labor. Toward the end of pregnancy (32-34 weeks), these contractions can develop more often and may become more forceful. This is not true labor because these contractions do not result in opening (dilatation) and thinning of the cervix. They are sometimes difficult to tell apart from true labor because these contractions can be forceful and people have different pain tolerances. You should not feel embarrassed if you go to the hospital with false labor. Sometimes, the only way to tell if you are in true labor is for your health care provider to look for changes in the cervix. °If there are no prenatal problems or other health problems associated with the pregnancy, it is completely safe to be sent home with false labor and await the onset of true labor. °HOW CAN YOU TELL THE DIFFERENCE BETWEEN TRUE AND FALSE LABOR? °False Labor  °· The contractions of false labor are usually shorter and not as hard as those of true labor.   °· The contractions are usually irregular.   °· The contractions are often felt in the front of the lower abdomen and in the groin.   °· The contractions may go away when you walk around or change positions while lying down.   °· The contractions get weaker and are shorter lasting as time goes on.   °· The contractions do not usually become progressively stronger, regular, and closer together as with true labor.   °True Labor  °· Contractions in true labor last 30-70 seconds, become very regular, usually become more intense, and increase in frequency.   °· The contractions do not go away with walking.   °· The discomfort is usually felt in the top of the uterus and spreads to the lower abdomen and low back.   °· True labor can be  determined by your health care provider with an exam. This will show that the cervix is dilating and getting thinner.   °WHAT TO REMEMBER °· Keep up with your usual exercises and follow other instructions given by your health care provider.   °· Take medicines as directed by your health care provider.   °· Keep your regular prenatal appointments.   °· Eat and drink lightly if you think you are going into labor.   °· If Braxton Hicks contractions are making you uncomfortable:   °¨ Change your position from lying down or resting to walking, or from walking to resting.   °¨ Sit and rest in a tub of warm water.   °¨ Drink 2-3 glasses of water. Dehydration may cause these contractions.   °¨ Do slow and deep breathing several times an hour.   °WHEN SHOULD I SEEK IMMEDIATE MEDICAL CARE? °Seek immediate medical care if: °· Your contractions become stronger, more regular, and closer together.   °· You have fluid leaking or gushing from your vagina.   °· You have a fever.   °· You pass blood-tinged mucus.   °· You have vaginal bleeding.   °· You have continuous abdominal pain.   °· You have low back pain that you never had before.   °· You feel your baby's head pushing down and causing pelvic pressure.   °· Your baby is not moving as much as it used to.   °This information is not intended to replace advice given to you by your health care provider. Make sure you discuss any questions you have with your health care   provider. °Document Released: 09/22/2005 Document Revised: 01/14/2016 Document Reviewed: 07/04/2013 °Elsevier Interactive Patient Education © 2017 [employer] Inc. ° °

## 2016-09-11 ENCOUNTER — Encounter: Payer: Self-pay | Admitting: Obstetrics & Gynecology

## 2016-09-12 ENCOUNTER — Ambulatory Visit (INDEPENDENT_AMBULATORY_CARE_PROVIDER_SITE_OTHER): Payer: Medicaid Other | Admitting: Obstetrics & Gynecology

## 2016-09-12 ENCOUNTER — Ambulatory Visit (INDEPENDENT_AMBULATORY_CARE_PROVIDER_SITE_OTHER): Payer: Self-pay | Admitting: Clinical

## 2016-09-12 VITALS — BP 126/65 | HR 72 | Wt 197.5 lb

## 2016-09-12 DIAGNOSIS — Z3483 Encounter for supervision of other normal pregnancy, third trimester: Secondary | ICD-10-CM

## 2016-09-12 DIAGNOSIS — Z3480 Encounter for supervision of other normal pregnancy, unspecified trimester: Secondary | ICD-10-CM

## 2016-09-12 DIAGNOSIS — F3341 Major depressive disorder, recurrent, in partial remission: Secondary | ICD-10-CM

## 2016-09-12 NOTE — Patient Instructions (Signed)
Return to clinic for any scheduled appointments or obstetric concerns, or go to MAU for evaluation  

## 2016-09-12 NOTE — Progress Notes (Signed)
   PRENATAL VISIT NOTE  Subjective:  Tonya Yates is a 30 y.o. [phone] [id number] at 10630w3d being seen today for ongoing prenatal care.  She is currently monitored for the following issues for this low-risk pregnancy and has Depression, major, single episode, severe (HCC); Suicidal ideations; Alcohol abuse; Suicidal ideation; Major depressive disorder, recurrent episode, severe with anxious distress (HCC); Supervision of other normal pregnancy, antepartum; Alcohol use affecting pregnancy in second trimester, antepartum; and Low lying placenta, antepartum on her problem list.  Patient reports no complaints.  Contractions: Irregular. Vag. Bleeding: None.  Movement: Present. Denies leaking of fluid.   The following portions of the patient's history were reviewed and updated as appropriate: allergies, current medications, past family history, past medical history, past social history, past surgical history and problem list. Problem list updated.  Objective:   Vitals:   09/12/16 1120  BP: 126/65  Pulse: 72  Weight: 197 lb 8 oz (89.6 kg)    Fetal Status: Fetal Heart Rate (bpm): 138 Fundal Height: 39 cm Movement: Present  Presentation: Vertex  General:  Alert, oriented and cooperative. Patient is in no acute distress.  Skin: Skin is warm and dry. No rash noted.   Cardiovascular: Normal heart rate noted  Respiratory: Normal respiratory effort, no problems with respiration noted  Abdomen: Soft, gravid, appropriate for gestational age. Pain/Pressure: Present     Pelvic:  Cervical exam performed Dilation: 2 Effacement (%): Thick Station: Ballotable  Extremities: Normal range of motion.  Edema: None  Mental Status: Normal mood and affect. Normal behavior. Normal judgment and thought content.   Assessment and Plan:  Pregnancy: [id number] at 3230w3d  Supervision of other normal pregnancy, antepartum Term labor symptoms and general obstetric precautions including but not limited to vaginal bleeding, contractions,  leaking of fluid and fetal movement were reviewed in detail with the patient. Please refer to After Visit Summary for other counseling recommendations.  Return in about 1 week (around 09/19/2016) for OB Visit.   Tereso NewcomerUgonna A Annaya Bangert, MD

## 2016-09-12 NOTE — BH Specialist Note (Signed)
Session Start time: 11:55   End Time: 12:05 Total Time:  10 minutes Type of Service: Behavioral Health - Individual/Family Interpreter: No.   Interpreter Name & Language: n/a # [facility]   SUBJECTIVE: Tonya Yates is a 30 y.o. female  Pt. was referred by  for:  anxiety and depression. Pt. reports the following symptoms/concerns: Pt states that she is feeling disappointed today that she may not have her baby until after Christmas; attributes increase in symptoms of anxiety and depression to feeling nausea (leading to lack of appetite) and not sleeping well(uncomfortable at end of pregnancy). Pt says it helps to talk about her feelings. Duration of problem:  Increase in past two weeks Severity: moderate Previous treatment: none   OBJECTIVE: Mood: Depressed & Affect: Depressed Risk of harm to self or others: No risk of harm to self or others today, no SI, no HI Assessments administered: PHQ9: 11/ GAD7: 13  LIFE CONTEXT:  Family & Social: Supportive family School/ Work:  Wants to go back to work Self-Care: Nails done past week, sleeping/eating problems in past two weeks Life changes: Current pregnancy What is important to pt/family (values): Giving birth asap   GOALS ADDRESSED:  -Normalize current feelings at this stage of pregnancy  INTERVENTIONS: Strength-based   ASSESSMENT:  Pt currently experiencing Recurrent major depressive disorder, in partial remission.  Pt may benefit from brief therapeutic support with follow-up in one week.      PLAN: 1. F/U with behavioral health clinician: One week 2. Behavioral Health meds: none 4. Referral: Brief Counseling/Psychotherapy 5. From scale of 1-10, how likely are you to follow plan: Undetermined   Rae LipsJamie C Mcmannes LCSWA Behavioral Health Clinician  Warmhandoff: no  Dep[facility] screen [facility] 2[facility] 09/12/2016 08/13/2016 07/24/2016 04/17/2016 04/15/2016  Decreased Interest 0 0 0 2 0  Down, Depressed, Hopeless 3  0 0 1 3  PHQ - 2 Score 3 0 0 3 3  Altered sleeping 3 3 3 1 3   Tired, decreased energy 3 3 3 1 3   Change in appetite 2 0 1 - 3  Feeling bad or failure about yourself  0 0 0 2 3  Trouble concentrating 0 0 0 1 0  Moving slowly or fidgety/restless 0 0 0 0 3  Suicidal thoughts 0 0 0 0 0  PHQ-9 Score 11 6 7 8 18   Difficult doing work/chores - - - - Very difficult   GAD 7 : Generalized Anxiety Score 09/12/2016 08/13/2016 07/24/2016 04/17/2016  Nervous, Anxious, on Edge 3 3 2 2   Control/stop worrying 3 0 0 3  Worry too much - different things 2 0 0 3  Trouble relaxing 3 0 0 2  Restless 0 2 2 0  Easily annoyed or irritable 2 1 2 3   Afraid - awful might happen 0 0 0 3  Total GAD 7 Score 13 6 6 16   Anxiety Difficulty - - - -

## 2016-09-17 ENCOUNTER — Encounter: Payer: Self-pay | Admitting: Medical

## 2016-09-18 ENCOUNTER — Encounter (HOSPITAL_COMMUNITY): Payer: Self-pay | Admitting: *Deleted

## 2016-09-18 ENCOUNTER — Inpatient Hospital Stay (HOSPITAL_COMMUNITY)
Admission: AD | Admit: 2016-09-18 | Discharge: 2016-09-18 | Disposition: A | Discharge status: Home / Self Care | Payer: Medicaid Other | Source: Ambulatory Visit | Attending: Obstetrics and Gynecology | Admitting: Obstetrics and Gynecology

## 2016-09-18 DIAGNOSIS — Z3A00 Weeks of gestation of pregnancy not specified: Secondary | ICD-10-CM | POA: Insufficient documentation

## 2016-09-18 DIAGNOSIS — Z3490 Encounter for supervision of normal pregnancy, unspecified, unspecified trimester: Secondary | ICD-10-CM | POA: Insufficient documentation

## 2016-09-18 LAB — URINALYSIS, ROUTINE W REFLEX MICROSCOPIC
Bilirubin Urine: NEGATIVE
Glucose, UA: 50 mg/dL — AB
Hgb urine dipstick: NEGATIVE
KETONES UR: NEGATIVE mg/dL
Nitrite: NEGATIVE
PH: 6 (ref 5.0–8.0)
Protein, ur: 30 mg/dL — AB
SPECIFIC GRAVITY, URINE: 1.027 (ref 1.005–1.030)

## 2016-09-18 NOTE — Discharge Instructions (Signed)
Third Trimester of Pregnancy °The third trimester is from week 29 through week 40 (months 7 through 9). The third trimester is a time when the unborn baby (fetus) is growing rapidly. At the end of the ninth month, the fetus is about 20 inches in length and weighs 6-10 pounds. °Body changes during your third trimester °Your body goes through many changes during pregnancy. The changes vary from woman to woman. During the third trimester: °· Your weight will continue to increase. You can expect to gain 25-35 pounds (11-16 kg) by the end of the pregnancy. °· You may begin to get stretch marks on your hips, abdomen, and breasts. °· You may urinate more often because the fetus is moving lower into your pelvis and pressing on your bladder. °· You may develop or continue to have heartburn. This is caused by increased hormones that slow down muscles in the digestive tract. °· You may develop or continue to have constipation because increased hormones slow digestion and cause the muscles that push waste through your intestines to relax. °· You may develop hemorrhoids. These are swollen veins (varicose veins) in the rectum that can itch or be painful. °· You may develop swollen, bulging veins (varicose veins) in your legs. °· You may have increased body aches in the pelvis, back, or thighs. This is due to weight gain and increased hormones that are relaxing your joints. °· You may have changes in your hair. These can include thickening of your hair, rapid growth, and changes in texture. Some women also have hair loss during or after pregnancy, or hair that feels dry or thin. Your hair will most likely return to normal after your baby is born. °· Your breasts will continue to grow and they will continue to become tender. A yellow fluid (colostrum) may leak from your breasts. This is the first milk you are producing for your baby. °· Your belly button may stick out. °· You may notice more swelling in your hands, face, or  ankles. °· You may have increased tingling or numbness in your hands, arms, and legs. The skin on your belly may also feel numb. °· You may feel short of breath because of your expanding uterus. °· You may have more problems sleeping. This can be caused by the size of your belly, increased need to urinate, and an increase in your body's metabolism. °· You may notice the fetus "dropping," or moving lower in your abdomen. °· You may have increased vaginal discharge. °· Your cervix becomes thin and soft (effaced) near your due date. °What to expect at prenatal visits °You will have prenatal exams every 2 weeks until week 36. Then you will have weekly prenatal exams. During a routine prenatal visit: °· You will be weighed to make sure you and the fetus are growing normally. °· Your blood pressure will be taken. °· Your abdomen will be measured to track your baby's growth. °· The fetal heartbeat will be listened to. °· Any test results from the previous visit will be discussed. °· You may have a cervical check near your due date to see if you have effaced. °At around 36 weeks, your health care provider will check your cervix. At the same time, your health care provider will also perform a test on the secretions of the vaginal tissue. This test is to determine if a type of bacteria, Group B streptococcus, is present. Your health care provider will explain this further. °Your health care provider may ask you: °·   What your birth plan is. °· How you are feeling. °· If you are feeling the baby move. °· If you have had any abnormal symptoms, such as leaking fluid, bleeding, severe headaches, or abdominal cramping. °· If you are using any tobacco products, including cigarettes, chewing tobacco, and electronic cigarettes. °· If you have any questions. °Other tests or screenings that may be performed during your third trimester include: °· Blood tests that check for low iron levels (anemia). °· Fetal testing to check the health,  activity level, and growth of the fetus. Testing is done if you have certain medical conditions or if there are problems during the pregnancy. °· Nonstress test (NST). This test checks the health of your baby to make sure there are no signs of problems, such as the baby not getting enough oxygen. During this test, a belt is placed around your belly. The baby is made to move, and its heart rate is monitored during movement. °What is false labor? °False labor is a condition in which you feel small, irregular tightenings of the muscles in the womb (contractions) that eventually go away. These are called Braxton Hicks contractions. Contractions may last for hours, days, or even weeks before true labor sets in. If contractions come at regular intervals, become more frequent, increase in intensity, or become painful, you should see your health care provider. °What are the signs of labor? °· Abdominal cramps. °· Regular contractions that start at 10 minutes apart and become stronger and more frequent with time. °· Contractions that start on the top of the uterus and spread down to the lower abdomen and back. °· Increased pelvic pressure and dull back pain. °· A watery or bloody mucus discharge that comes from the vagina. °· Leaking of amniotic fluid. This is also known as your "water breaking." It could be a slow trickle or a gush. Let your doctor know if it has a color or strange odor. °If you have any of these signs, call your health care provider right away, even if it is before your due date. °Follow these instructions at home: °Eating and drinking °· Continue to eat regular, healthy meals. °· Do not eat: °¨ Raw meat or meat spreads. °¨ Unpasteurized milk or cheese. °¨ Unpasteurized juice. °¨ Store-made salad. °¨ Refrigerated smoked seafood. °¨ Hot dogs or deli meat, unless they are piping hot. °¨ More than 6 ounces of albacore tuna a week. °¨ Shark, swordfish, king mackerel, or tile fish. °¨ Store-made salads. °¨ Raw  sprouts, such as mung bean or alfalfa sprouts. °· Take prenatal vitamins as told by your health care provider. °· Take 1000 mg of calcium daily as told by your health care provider. °· If you develop constipation: °¨ Take over-the-counter or prescription medicines. °¨ Drink enough fluid to keep your urine clear or pale yellow. °¨ Eat foods that are high in fiber, such as fresh fruits and vegetables, whole grains, and beans. °¨ Limit foods that are high in fat and processed sugars, such as fried and sweet foods. °Activity °· Exercise only as directed by your health care provider. Healthy pregnant women should aim for 2 hours and 30 minutes of moderate exercise per week. If you experience any pain or discomfort while exercising, stop. °· Avoid heavy lifting. °· Do not exercise in extreme heat or humidity, or at high altitudes. °· Wear low-heel, comfortable shoes. °· Practice good posture. °· Do not travel far distances unless it is absolutely necessary and only with the approval   of your health care provider. °· Wear your seat belt at all times while in a car, on a bus, or on a plane. °· Take frequent breaks and rest with your legs elevated if you have leg cramps or low back pain. °· Do not use hot tubs, steam rooms, or saunas. °· You may continue to have sex unless your health care provider tells you otherwise. °Lifestyle °· Do not use any products that contain nicotine or tobacco, such as cigarettes and e-cigarettes. If you need help quitting, ask your health care provider. °· Do not drink alcohol. °· Do not use any medicinal herbs or unprescribed drugs. These chemicals affect the formation and growth of the baby. °· If you develop varicose veins: °¨ Wear support pantyhose or compression stockings as told by your healthcare provider. °¨ Elevate your feet for 15 minutes, 3-4 times a day. °· Wear a supportive maternity bra to help with breast tenderness. °General instructions °· Take over-the-counter and prescription  medicines only as told by your health care provider. There are medicines that are either safe or unsafe to take during pregnancy. °· Take warm sitz baths to soothe any pain or discomfort caused by hemorrhoids. Use hemorrhoid cream or witch hazel if your health care provider approves. °· Avoid cat litter boxes and soil used by cats. These carry germs that can cause birth defects in the baby. If you have a cat, ask someone to clean the litter box for you. °· To prepare for the arrival of your baby: °¨ Take prenatal classes to understand, practice, and ask questions about the labor and delivery. °¨ Make a trial run to the hospital. °¨ Visit the hospital and tour the maternity area. °¨ Arrange for maternity or paternity leave through employers. °¨ Arrange for family and friends to take care of pets while you are in the hospital. °¨ Purchase a rear-facing car seat and make sure you know how to install it in your car. °¨ Pack your hospital bag. °¨ Prepare the baby’s nursery. Make sure to remove all pillows and stuffed animals from the baby's crib to prevent suffocation. °· Visit your dentist if you have not gone during your pregnancy. Use a soft toothbrush to brush your teeth and be gentle when you floss. °· Keep all prenatal follow-up visits as told by your health care provider. This is important. °Contact a health care provider if: °· You are unsure if you are in labor or if your water has broken. °· You become dizzy. °· You have mild pelvic cramps, pelvic pressure, or nagging pain in your abdominal area. °· You have lower back pain. °· You have persistent nausea, vomiting, or diarrhea. °· You have an unusual or bad smelling vaginal discharge. °· You have pain when you urinate. °Get help right away if: °· You have a fever. °· You are leaking fluid from your vagina. °· You have spotting or bleeding from your vagina. °· You have severe abdominal pain or cramping. °· You have rapid weight loss or weight gain. °· You have  shortness of breath with chest pain. °· You notice sudden or extreme swelling of your face, hands, ankles, feet, or legs. °· Your baby makes fewer than 10 movements in 2 hours. °· You have severe headaches that do not go away with medicine. °· You have vision changes. °Summary °· The third trimester is from week 29 through week 40, months 7 through 9. The third trimester is a time when the unborn baby (fetus)   is growing rapidly. °· During the third trimester, your discomfort may increase as you and your baby continue to gain weight. You may have abdominal, leg, and back pain, sleeping problems, and an increased need to urinate. °· During the third trimester your breasts will keep growing and they will continue to become tender. A yellow fluid (colostrum) may leak from your breasts. This is the first milk you are producing for your baby. °· False labor is a condition in which you feel small, irregular tightenings of the muscles in the womb (contractions) that eventually go away. These are called Braxton Hicks contractions. Contractions may last for hours, days, or even weeks before true labor sets in. °· Signs of labor can include: abdominal cramps; regular contractions that start at 10 minutes apart and become stronger and more frequent with time; watery or bloody mucus discharge that comes from the vagina; increased pelvic pressure and dull back pain; and leaking of amniotic fluid. °This information is not intended to replace advice given to you by your health care provider. Make sure you discuss any questions you have with your health care provider. °Document Released: 09/16/2001 Document Revised: 02/28/2016 Document Reviewed: 11/23/2012 °Elsevier Interactive Patient Education © 2017 [employer] °Introduction °Patient Name: ________________________________________________ Patient Due Date: ____________________ °What is a fetal movement count? °A fetal movement count is the number of times that you feel your baby  move during a certain amount of time. This may also be called a fetal kick count. A fetal movement count is recommended for every pregnant woman. You may be asked to start counting fetal movements as early as week 28 of your pregnancy. °Pay attention to when your baby is most active. You may notice your baby's sleep and wake cycles. You may also notice things that make your baby move more. You should do a fetal movement count: °· When your baby is normally most active. °· At the same time each day. °A good time to count movements is while you are resting, after having something to eat and drink. °How do I count fetal movements? °1. Find a quiet, comfortable area. Sit, or lie down on your side. °2. Write down the date, the start time and stop time, and the number of movements that you felt between those two times. Take this information with you to your health care visits. °3. For 2 hours, count kicks, flutters, swishes, rolls, and jabs. You should feel at least 10 movements during 2 hours. °4. You may stop counting after you have felt 10 movements. °5. If you do not feel 10 movements in 2 hours, have something to eat and drink. Then, keep resting and counting for 1 hour. If you feel at least 4 movements during that hour, you may stop counting. °Contact a health care provider if: °· You feel fewer than 4 movements in 2 hours. °· Your baby is not moving like he or she usually does. °Date: ____________ Start time: ____________ Stop time: ____________ Movements: ____________ °Date: ____________ Start time: ____________ Stop time: ____________ Movements: ____________ °Date: ____________ Start time: ____________ Stop time: ____________ Movements: ____________ °Date: ____________ Start time: ____________ Stop time: ____________ Movements: ____________ °Date: ____________ Start time: ____________ Stop time: ____________ Movements: ____________ °Date: ____________ Start time: ____________ Stop time: ____________ Movements:  ____________ °Date: ____________ Start time: ____________ Stop time: ____________ Movements: ____________ °Date: ____________ Start time: ____________ Stop time: ____________ Movements: ____________ °Date: ____________ Start time: ____________ Stop time: ____________ Movements: ____________ °This information is not intended to replace   advice given to you by your health care provider. Make sure you discuss any questions you have with your health care provider. °Document Released: 10/22/2006 Document Revised: 05/21/2016 Document Reviewed: 11/01/2015 °Elsevier Interactive Patient Education © 2017 [employer] ° °

## 2016-09-18 NOTE — MAU Note (Signed)
Pt c/o increased pain and pressure/sharp pain when she walks. Having ctx every day. C/o diarrhea x 6 days. Good fetal movement felt and denies vag bleeding or laing.

## 2016-09-18 NOTE — Progress Notes (Signed)
Dr Chales Abrahamsyson notified of pt's complaints and VE, orders received to discharge home

## 2016-09-23 ENCOUNTER — Ambulatory Visit (INDEPENDENT_AMBULATORY_CARE_PROVIDER_SITE_OTHER): Payer: Medicaid Other | Admitting: Student

## 2016-09-23 VITALS — BP 139/84 | HR 70 | Wt 199.0 lb

## 2016-09-23 DIAGNOSIS — Z3480 Encounter for supervision of other normal pregnancy, unspecified trimester: Secondary | ICD-10-CM

## 2016-09-23 DIAGNOSIS — Z3483 Encounter for supervision of other normal pregnancy, third trimester: Secondary | ICD-10-CM

## 2016-09-23 NOTE — Progress Notes (Signed)
   PRENATAL VISIT NOTE  Subjective:  Tonya Yates is a 30 y.o. [phone] at 5460w0d being seen today for ongoing prenatal care.  She is currently monitored for the following issues for this high-risk pregnancy and has Depression, major, single episode, severe (HCC); Suicidal ideations; Alcohol abuse; Suicidal ideation; Major depressive disorder, recurrent episode, severe with anxious distress (HCC); Supervision of other normal pregnancy, antepartum; and Alcohol use affecting pregnancy in second trimester, antepartum on her problem list.  Patient reports occasional contractions.  Contractions: Irregular. Vag. Bleeding: None.  Movement: Present. Denies leaking of fluid.   The following portions of the patient's history were reviewed and updated as appropriate: allergies, current medications, past family history, past medical history, past social history, past surgical history and problem list. Problem list updated.  Objective:   Vitals:   09/23/16 1545  BP: 139/84  Pulse: 70  Weight: 199 lb (90.3 kg)    Fetal Status: Fetal Heart Rate (bpm): 154 Fundal Height: 40 cm Movement: Present  Presentation: Vertex  General:  Alert, oriented and cooperative. Patient is in no acute distress.  Skin: Skin is warm and dry. No rash noted.   Cardiovascular: Normal heart rate noted  Respiratory: Normal respiratory effort, no problems with respiration noted  Abdomen: Soft, gravid, appropriate for gestational age. Pain/Pressure: Present     Pelvic:  Cervical exam performed Dilation: 3 Effacement (%): 40 Station: -3  Extremities: Normal range of motion.  Edema: Trace  Mental Status: Normal mood and affect. Normal behavior. Normal judgment and thought content.   Assessment and Plan:  Pregnancy: [name] at 5960w0d 1. Supervision of other normal pregnancy, antepartum -Patient requesting IOL on 12/23; states this is the anniversary of her best friend's death & would like her child's birthday to be the same. Patient  will be 7170w4d at time of induction, hx TSVD x 4, SVE today 3/40/-3/soft. Discussed with Dr. Macon LargeAnyanwu, who is the attending for that day; ok with plan. Will schedule IOL.   Term labor symptoms and general obstetric precautions including but not limited to vaginal bleeding, contractions, leaking of fluid and fetal movement were reviewed in detail with the patient. Please refer to After Visit Summary for other counseling recommendations.  Return in about 3 days (around 09/26/2016) for NST.   Judeth HornErin Jasminemarie Sherrard, NP

## 2016-09-23 NOTE — Progress Notes (Signed)
IOL scheduled for 09/27/16 @ midnight (from 09/26/16).  LM on pts VM in regards to appt.  Will verify with pt at her NST appt scheduled for 09/26/16 that she received message about IOL appt.

## 2016-09-23 NOTE — Patient Instructions (Signed)
Labor Induction Labor induction is when steps are taken to cause a pregnant woman to begin the labor process. Most women go into labor on their own between 37 weeks and 42 weeks of the pregnancy. When this does not happen or when there is a medical need, methods may be used to induce labor. Labor induction causes a pregnant woman's uterus to contract. It also causes the cervix to soften (ripen), open (dilate), and thin out (efface). Usually, labor is not induced before 39 weeks of the pregnancy unless there is a problem with the baby or mother. Before inducing labor, your health care provider will consider a number of factors, including the following:  The medical condition of you and the baby.  How many weeks along you are.  The status of the baby's lung maturity.  The condition of the cervix.  The position of the baby. What are the reasons for labor induction? Labor may be induced for the following reasons:  The health of the baby or mother is at risk.  The pregnancy is overdue by 1 week or more.  The water breaks but labor does not start on its own.  The mother has a health condition or serious illness, such as high blood pressure, infection, placental abruption, or diabetes.  The amniotic fluid amounts are low around the baby.  The baby is distressed. Convenience or wanting the baby to be born on a certain date is not a reason for inducing labor. What methods are used for labor induction? Several methods of labor induction may be used, such as:  Prostaglandin medicine. This medicine causes the cervix to dilate and ripen. The medicine will also start contractions. It can be taken by mouth or by inserting a suppository into the vagina.  Inserting a thin tube (catheter) with a balloon on the end into the vagina to dilate the cervix. Once inserted, the balloon is expanded with water, which causes the cervix to open.  Stripping the membranes. Your health care provider separates  amniotic sac tissue from the cervix, causing the cervix to be stretched and causing the release of a hormone called progesterone. This may cause the uterus to contract. It is often done during an office visit. You will be sent home to wait for the contractions to begin. You will then come in for an induction.  Breaking the water. Your health care provider makes a hole in the amniotic sac using a small instrument. Once the amniotic sac breaks, contractions should begin. This may still take hours to see an effect.  Medicine to trigger or strengthen contractions. This medicine is given through an IV access tube inserted into a vein in your arm. All of the methods of induction, besides stripping the membranes, will be done in the hospital. Induction is done in the hospital so that you and the baby can be carefully monitored. How long does it take for labor to be induced? Some inductions can take up to 2-3 days. Depending on the cervix, it usually takes less time. It takes longer when you are induced early in the pregnancy or if this is your first pregnancy. If a mother is still pregnant and the induction has been going on for 2-3 days, either the mother will be sent home or a cesarean delivery will be needed. What are the risks associated with labor induction? Some of the risks of induction include:  Changes in fetal heart rate, such as too high, too low, or erratic.  Fetal distress.    Chance of infection for the mother and baby.  Increased chance of having a cesarean delivery.  Breaking off (abruption) of the placenta from the uterus (rare).  Uterine rupture (very rare). When induction is needed for medical reasons, the benefits of induction may outweigh the risks. What are some reasons for not inducing labor? Labor induction should not be done if:  It is shown that your baby does not tolerate labor.  You have had previous surgeries on your uterus, such as a myomectomy or the removal of  fibroids.  Your placenta lies very low in the uterus and blocks the opening of the cervix (placenta previa).  Your baby is not in a head-down position.  The umbilical cord drops down into the birth canal in front of the baby. This could cut off the baby's blood and oxygen supply.  You have had a previous cesarean delivery.  There are unusual circumstances, such as the baby being extremely premature. This information is not intended to replace advice given to you by your health care provider. Make sure you discuss any questions you have with your health care provider. Document Released: 02/11/2007 Document Revised: 02/28/2016 Document Reviewed: 04/21/2013 Elsevier Interactive Patient Education  2017 [employer]  

## 2016-09-25 ENCOUNTER — Encounter (HOSPITAL_COMMUNITY): Payer: Self-pay | Admitting: *Deleted

## 2016-09-25 ENCOUNTER — Telehealth (HOSPITAL_COMMUNITY): Payer: Self-pay | Admitting: *Deleted

## 2016-09-25 NOTE — Telephone Encounter (Signed)
Preadmission screen  

## 2016-09-26 ENCOUNTER — Ambulatory Visit: Payer: Self-pay

## 2016-09-26 ENCOUNTER — Ambulatory Visit (INDEPENDENT_AMBULATORY_CARE_PROVIDER_SITE_OTHER): Payer: Medicaid Other | Admitting: Obstetrics & Gynecology

## 2016-09-26 VITALS — BP 129/74 | HR 78

## 2016-09-26 DIAGNOSIS — Z3689 Encounter for other specified antenatal screening: Secondary | ICD-10-CM | POA: Diagnosis not present

## 2016-09-26 DIAGNOSIS — O480 Post-term pregnancy: Secondary | ICD-10-CM | POA: Diagnosis not present

## 2016-09-27 ENCOUNTER — Inpatient Hospital Stay (HOSPITAL_COMMUNITY)
Admission: RE | Admit: 2016-09-27 | Discharge: 2016-09-30 | DRG: 766 | Disposition: A | Discharge status: Home / Self Care | Payer: Medicaid Other | Source: Ambulatory Visit | Attending: Obstetrics & Gynecology | Admitting: Obstetrics & Gynecology

## 2016-09-27 ENCOUNTER — Inpatient Hospital Stay (HOSPITAL_COMMUNITY): Payer: Medicaid Other | Admitting: Anesthesiology

## 2016-09-27 ENCOUNTER — Encounter (HOSPITAL_COMMUNITY): Payer: Self-pay

## 2016-09-27 ENCOUNTER — Encounter (HOSPITAL_COMMUNITY)
Admission: RE | Disposition: A | Discharge status: Home / Self Care | Payer: Self-pay | Source: Ambulatory Visit | Attending: Obstetrics and Gynecology

## 2016-09-27 DIAGNOSIS — E669 Obesity, unspecified: Secondary | ICD-10-CM | POA: Diagnosis present

## 2016-09-27 DIAGNOSIS — K219 Gastro-esophageal reflux disease without esophagitis: Secondary | ICD-10-CM | POA: Diagnosis present

## 2016-09-27 DIAGNOSIS — O99214 Obesity complicating childbirth: Secondary | ICD-10-CM | POA: Diagnosis present

## 2016-09-27 DIAGNOSIS — Z87891 Personal history of nicotine dependence: Secondary | ICD-10-CM

## 2016-09-27 DIAGNOSIS — Z833 Family history of diabetes mellitus: Secondary | ICD-10-CM

## 2016-09-27 DIAGNOSIS — Z6836 Body mass index (BMI) 36.0-36.9, adult: Secondary | ICD-10-CM | POA: Diagnosis not present

## 2016-09-27 DIAGNOSIS — O26813 Pregnancy related exhaustion and fatigue, third trimester: Secondary | ICD-10-CM | POA: Diagnosis present

## 2016-09-27 DIAGNOSIS — O99314 Alcohol use complicating childbirth: Secondary | ICD-10-CM | POA: Diagnosis present

## 2016-09-27 DIAGNOSIS — Z3A40 40 weeks gestation of pregnancy: Secondary | ICD-10-CM

## 2016-09-27 DIAGNOSIS — O9962 Diseases of the digestive system complicating childbirth: Secondary | ICD-10-CM | POA: Diagnosis present

## 2016-09-27 DIAGNOSIS — O480 Post-term pregnancy: Secondary | ICD-10-CM | POA: Diagnosis present

## 2016-09-27 DIAGNOSIS — Z98891 History of uterine scar from previous surgery: Secondary | ICD-10-CM

## 2016-09-27 LAB — CBC
HCT: 32.3 % — ABNORMAL LOW (ref 36.0–46.0)
Hemoglobin: 10.8 g/dL — ABNORMAL LOW (ref 12.0–15.0)
MCH: 28.8 pg (ref 26.0–34.0)
MCHC: 33.4 g/dL (ref 30.0–36.0)
MCV: 86.1 fL (ref 78.0–100.0)
PLATELETS: 239 10*3/uL (ref 150–400)
RBC: 3.75 MIL/uL — AB (ref 3.87–5.11)
RDW: 14 % (ref 11.5–15.5)
WBC: 13.8 10*3/uL — AB (ref 4.0–10.5)

## 2016-09-27 LAB — TYPE AND SCREEN
ABO/RH(D): O POS
Antibody Screen: NEGATIVE

## 2016-09-27 LAB — ABO/RH: ABO/RH(D): O POS

## 2016-09-27 LAB — RPR: RPR Ser Ql: NONREACTIVE

## 2016-09-27 SURGERY — Surgical Case
Anesthesia: Epidural | Site: Abdomen

## 2016-09-27 MED ORDER — METHYLERGONOVINE MALEATE 0.2 MG/ML IJ SOLN
INTRAMUSCULAR | Status: DC | PRN
  Administered 2016-09-27: 0.2 mg via INTRAMUSCULAR

## 2016-09-27 MED ORDER — NALOXONE HCL 0.4 MG/ML IJ SOLN: 0.4000 mg | INTRAMUSCULAR | Status: DC | PRN

## 2016-09-27 MED ORDER — MEASLES, MUMPS & RUBELLA VAC ~~LOC~~ INJ
0.5000 mL | INJECTION | Freq: Once | SUBCUTANEOUS | Status: DC
  Filled 2016-09-27: qty 0.5

## 2016-09-27 MED ORDER — FLEET ENEMA 7-19 GM/118ML RE ENEM: 1.0000 | ENEMA | RECTAL | Status: DC | PRN

## 2016-09-27 MED ORDER — NALBUPHINE HCL 10 MG/ML IJ SOLN: 5.0000 mg | Freq: Once | INTRAMUSCULAR | Status: DC | PRN

## 2016-09-27 MED ORDER — PHENYLEPHRINE HCL 10 MG/ML IJ SOLN
INTRAMUSCULAR | Status: DC | PRN
  Administered 2016-09-27 (×4): 40 ug via INTRAVENOUS

## 2016-09-27 MED ORDER — ONDANSETRON HCL 4 MG/2ML IJ SOLN
4.0000 mg | Freq: Four times a day (QID) | INTRAMUSCULAR | Status: DC | PRN
  Administered 2016-09-27: 4 mg via INTRAVENOUS
  Filled 2016-09-27: qty 2

## 2016-09-27 MED ORDER — EPHEDRINE 5 MG/ML INJ: 10.0000 mg | INTRAVENOUS | Status: DC | PRN

## 2016-09-27 MED ORDER — NALBUPHINE HCL 10 MG/ML IJ SOLN
5.0000 mg | INTRAMUSCULAR | Status: DC | PRN
  Administered 2016-09-28: 5 mg via INTRAVENOUS
  Filled 2016-09-27 (×2): qty 1

## 2016-09-27 MED ORDER — LACTATED RINGERS IV SOLN: 500.0000 mL | Freq: Once | INTRAVENOUS | Status: DC

## 2016-09-27 MED ORDER — SIMETHICONE 80 MG PO CHEW
80.0000 mg | CHEWABLE_TABLET | ORAL | Status: DC
  Administered 2016-09-29 (×2): 80 mg via ORAL
  Filled 2016-09-27 (×2): qty 1

## 2016-09-27 MED ORDER — LACTATED RINGERS IV SOLN
INTRAVENOUS | Status: DC
  Administered 2016-09-27 (×4): via INTRAVENOUS

## 2016-09-27 MED ORDER — MORPHINE SULFATE (PF) 0.5 MG/ML IJ SOLN
INTRAMUSCULAR | Status: AC
  Filled 2016-09-27: qty 10

## 2016-09-27 MED ORDER — TETANUS-DIPHTH-ACELL PERTUSSIS 5-2.5-18.5 LF-MCG/0.5 IM SUSP: 0.5000 mL | Freq: Once | INTRAMUSCULAR | Status: DC

## 2016-09-27 MED ORDER — MEPERIDINE HCL 25 MG/ML IJ SOLN
INTRAMUSCULAR | Status: DC | PRN
  Administered 2016-09-27 (×2): 12.5 mg via INTRAVENOUS

## 2016-09-27 MED ORDER — OXYCODONE-ACETAMINOPHEN 5-325 MG PO TABS: 1.0000 | ORAL_TABLET | ORAL | Status: DC | PRN

## 2016-09-27 MED ORDER — TERBUTALINE SULFATE 1 MG/ML IJ SOLN: 0.2500 mg | Freq: Once | INTRAMUSCULAR | Status: DC | PRN

## 2016-09-27 MED ORDER — MORPHINE SULFATE (PF) 0.5 MG/ML IJ SOLN
INTRAMUSCULAR | Status: DC | PRN
  Administered 2016-09-27: .5 mg via INTRAVENOUS
  Administered 2016-09-27: 4 mg via EPIDURAL
  Administered 2016-09-27: .5 mg via EPIDURAL

## 2016-09-27 MED ORDER — SCOPOLAMINE 1 MG/3DAYS TD PT72
MEDICATED_PATCH | TRANSDERMAL | Status: AC
  Filled 2016-09-27: qty 1

## 2016-09-27 MED ORDER — CEFAZOLIN SODIUM-DEXTROSE 2-4 GM/100ML-% IV SOLN
INTRAVENOUS | Status: AC
  Filled 2016-09-27: qty 100

## 2016-09-27 MED ORDER — COCONUT OIL OIL: 1.0000 "application " | TOPICAL_OIL | Status: DC | PRN

## 2016-09-27 MED ORDER — ZOLPIDEM TARTRATE 5 MG PO TABS: 5.0000 mg | ORAL_TABLET | Freq: Every evening | ORAL | Status: DC | PRN

## 2016-09-27 MED ORDER — LACTATED RINGERS IV SOLN
INTRAVENOUS | Status: DC
  Administered 2016-09-27: 18:00:00 via INTRAUTERINE

## 2016-09-27 MED ORDER — OXYTOCIN 10 UNIT/ML IJ SOLN
INTRAMUSCULAR | Status: AC
  Filled 2016-09-27: qty 4

## 2016-09-27 MED ORDER — SODIUM CHLORIDE 0.9% FLUSH: 3.0000 mL | INTRAVENOUS | Status: DC | PRN

## 2016-09-27 MED ORDER — LIDOCAINE-EPINEPHRINE 2 %-1:100000 IJ SOLN
INTRAMUSCULAR | Status: DC | PRN
Start: 2016-09-27 — End: 2016-09-27
  Administered 2016-09-27: 5 mL via INTRADERMAL
  Administered 2016-09-27: 3 mL via INTRADERMAL

## 2016-09-27 MED ORDER — SIMETHICONE 80 MG PO CHEW
80.0000 mg | CHEWABLE_TABLET | ORAL | Status: DC | PRN
  Administered 2016-09-28 – 2016-09-29 (×2): 80 mg via ORAL
  Filled 2016-09-27 (×2): qty 1

## 2016-09-27 MED ORDER — DIPHENHYDRAMINE HCL 50 MG/ML IJ SOLN: 12.5000 mg | INTRAMUSCULAR | Status: DC | PRN

## 2016-09-27 MED ORDER — MENTHOL 3 MG MT LOZG: 1.0000 | LOZENGE | OROMUCOSAL | Status: DC | PRN

## 2016-09-27 MED ORDER — OXYTOCIN 10 UNIT/ML IJ SOLN
INTRAVENOUS | Status: DC | PRN
  Administered 2016-09-27: 40 [IU] via INTRAVENOUS

## 2016-09-27 MED ORDER — FERROUS SULFATE 325 (65 FE) MG PO TABS
325.0000 mg | ORAL_TABLET | Freq: Two times a day (BID) | ORAL | Status: DC
  Administered 2016-09-28 – 2016-09-30 (×3): 325 mg via ORAL
  Filled 2016-09-27 (×3): qty 1

## 2016-09-27 MED ORDER — BUPIVACAINE HCL (PF) 0.5 % IJ SOLN
INTRAMUSCULAR | Status: AC
  Filled 2016-09-27: qty 30

## 2016-09-27 MED ORDER — BUPIVACAINE HCL (PF) 0.5 % IJ SOLN
INTRAMUSCULAR | Status: DC | PRN
  Administered 2016-09-27: 30 mL

## 2016-09-27 MED ORDER — PHENYLEPHRINE 40 MCG/ML (10ML) SYRINGE FOR IV PUSH (FOR BLOOD PRESSURE SUPPORT)
80.0000 ug | PREFILLED_SYRINGE | INTRAVENOUS | Status: DC | PRN
  Filled 2016-09-27: qty 10

## 2016-09-27 MED ORDER — LACTATED RINGERS IV SOLN
INTRAVENOUS | Status: DC
  Administered 2016-09-28 (×2): via INTRAVENOUS

## 2016-09-27 MED ORDER — FENTANYL CITRATE (PF) 100 MCG/2ML IJ SOLN
25.0000 ug | INTRAMUSCULAR | Status: DC | PRN
  Administered 2016-09-27: 50 ug via INTRAVENOUS

## 2016-09-27 MED ORDER — ONDANSETRON HCL 4 MG/2ML IJ SOLN: 4.0000 mg | Freq: Three times a day (TID) | INTRAMUSCULAR | Status: DC | PRN

## 2016-09-27 MED ORDER — ACETAMINOPHEN 500 MG PO TABS
1000.0000 mg | ORAL_TABLET | Freq: Four times a day (QID) | ORAL | Status: AC
  Administered 2016-09-28: 1000 mg via ORAL
  Filled 2016-09-27: qty 2

## 2016-09-27 MED ORDER — ONDANSETRON HCL 4 MG/2ML IJ SOLN
INTRAMUSCULAR | Status: AC
  Filled 2016-09-27: qty 2

## 2016-09-27 MED ORDER — DEXAMETHASONE SODIUM PHOSPHATE 10 MG/ML IJ SOLN
INTRAMUSCULAR | Status: AC
  Filled 2016-09-27: qty 1

## 2016-09-27 MED ORDER — PRENATAL MULTIVITAMIN CH
1.0000 | ORAL_TABLET | Freq: Every day | ORAL | Status: DC
  Administered 2016-09-28 – 2016-09-29 (×2): 1 via ORAL
  Filled 2016-09-27 (×3): qty 1

## 2016-09-27 MED ORDER — IBUPROFEN 600 MG PO TABS: 600.0000 mg | ORAL_TABLET | Freq: Four times a day (QID) | ORAL | Status: DC | PRN

## 2016-09-27 MED ORDER — OXYCODONE-ACETAMINOPHEN 5-325 MG PO TABS
2.0000 | ORAL_TABLET | ORAL | Status: DC | PRN
Start: 2016-09-27 — End: 2016-09-27

## 2016-09-27 MED ORDER — SOD CITRATE-CITRIC ACID 500-334 MG/5ML PO SOLN
30.0000 mL | ORAL | Status: DC | PRN
  Administered 2016-09-27: 30 mL via ORAL
  Filled 2016-09-27: qty 15

## 2016-09-27 MED ORDER — KETOROLAC TROMETHAMINE 30 MG/ML IJ SOLN: 30.0000 mg | Freq: Four times a day (QID) | INTRAMUSCULAR | Status: AC | PRN

## 2016-09-27 MED ORDER — METHYLERGONOVINE MALEATE 0.2 MG/ML IJ SOLN
INTRAMUSCULAR | Status: AC
  Filled 2016-09-27: qty 1

## 2016-09-27 MED ORDER — OXYTOCIN 40 UNITS IN LACTATED RINGERS INFUSION - SIMPLE MED
1.0000 m[IU]/min | INTRAVENOUS | Status: DC
  Administered 2016-09-27: 2 m[IU]/min via INTRAVENOUS
  Filled 2016-09-27: qty 1000

## 2016-09-27 MED ORDER — NALOXONE HCL 2 MG/2ML IJ SOSY
1.0000 ug/kg/h | PREFILLED_SYRINGE | INTRAMUSCULAR | Status: DC | PRN
  Filled 2016-09-27: qty 2

## 2016-09-27 MED ORDER — MAGNESIUM HYDROXIDE 400 MG/5ML PO SUSP: 30.0000 mL | ORAL | Status: DC | PRN

## 2016-09-27 MED ORDER — OXYTOCIN 40 UNITS IN LACTATED RINGERS INFUSION - SIMPLE MED: 2.5000 [IU]/h | INTRAVENOUS | Status: DC

## 2016-09-27 MED ORDER — FENTANYL 2.5 MCG/ML BUPIVACAINE 1/10 % EPIDURAL INFUSION (WH - ANES)
14.0000 mL/h | INTRAMUSCULAR | Status: DC | PRN
  Administered 2016-09-27 (×2): 14 mL/h via EPIDURAL
  Filled 2016-09-27 (×2): qty 100

## 2016-09-27 MED ORDER — DIPHENHYDRAMINE HCL 25 MG PO CAPS: 25.0000 mg | ORAL_CAPSULE | Freq: Four times a day (QID) | ORAL | Status: DC | PRN

## 2016-09-27 MED ORDER — DIPHENHYDRAMINE HCL 50 MG/ML IJ SOLN
12.5000 mg | INTRAMUSCULAR | Status: AC | PRN
  Administered 2016-09-27 (×3): 12.5 mg via INTRAVENOUS
  Filled 2016-09-27: qty 1

## 2016-09-27 MED ORDER — OXYCODONE-ACETAMINOPHEN 5-325 MG PO TABS
1.0000 | ORAL_TABLET | ORAL | Status: DC | PRN
  Administered 2016-09-28 – 2016-09-30 (×7): 1 via ORAL
  Filled 2016-09-27 (×10): qty 1

## 2016-09-27 MED ORDER — MEPERIDINE HCL 25 MG/ML IJ SOLN: 6.2500 mg | INTRAMUSCULAR | Status: DC | PRN

## 2016-09-27 MED ORDER — SENNOSIDES-DOCUSATE SODIUM 8.6-50 MG PO TABS
2.0000 | ORAL_TABLET | ORAL | Status: DC
  Administered 2016-09-29 (×2): 2 via ORAL
  Filled 2016-09-27 (×2): qty 2

## 2016-09-27 MED ORDER — LIDOCAINE HCL (PF) 1 % IJ SOLN
INTRAMUSCULAR | Status: DC | PRN
  Administered 2016-09-27: 4 mL
  Administered 2016-09-27: 6 mL via EPIDURAL

## 2016-09-27 MED ORDER — FENTANYL CITRATE (PF) 100 MCG/2ML IJ SOLN
INTRAMUSCULAR | Status: AC
  Filled 2016-09-27: qty 2

## 2016-09-27 MED ORDER — ONDANSETRON HCL 4 MG/2ML IJ SOLN
INTRAMUSCULAR | Status: DC | PRN
  Administered 2016-09-27: 4 mg via INTRAVENOUS

## 2016-09-27 MED ORDER — POLYVINYL ALCOHOL 1.4 % OP SOLN
1.0000 [drp] | Freq: Four times a day (QID) | OPHTHALMIC | Status: DC | PRN
  Filled 2016-09-27: qty 15

## 2016-09-27 MED ORDER — LIDOCAINE HCL (PF) 1 % IJ SOLN
30.0000 mL | INTRAMUSCULAR | Status: DC | PRN
  Filled 2016-09-27: qty 30

## 2016-09-27 MED ORDER — OXYTOCIN 40 UNITS IN LACTATED RINGERS INFUSION - SIMPLE MED: 2.5000 [IU]/h | INTRAVENOUS | Status: AC

## 2016-09-27 MED ORDER — DIPHENHYDRAMINE HCL 25 MG PO CAPS: 25.0000 mg | ORAL_CAPSULE | ORAL | Status: DC | PRN

## 2016-09-27 MED ORDER — SCOPOLAMINE 1 MG/3DAYS TD PT72
MEDICATED_PATCH | TRANSDERMAL | Status: DC | PRN
  Administered 2016-09-27: 1 via TRANSDERMAL

## 2016-09-27 MED ORDER — NALBUPHINE HCL 10 MG/ML IJ SOLN: 5.0000 mg | INTRAMUSCULAR | Status: DC | PRN

## 2016-09-27 MED ORDER — MEPERIDINE HCL 25 MG/ML IJ SOLN
INTRAMUSCULAR | Status: AC
  Filled 2016-09-27: qty 1

## 2016-09-27 MED ORDER — ACETAMINOPHEN 325 MG PO TABS: 650.0000 mg | ORAL_TABLET | ORAL | Status: DC | PRN

## 2016-09-27 MED ORDER — IBUPROFEN 600 MG PO TABS
600.0000 mg | ORAL_TABLET | Freq: Four times a day (QID) | ORAL | Status: DC
  Administered 2016-09-28 – 2016-09-30 (×10): 600 mg via ORAL
  Filled 2016-09-27 (×10): qty 1

## 2016-09-27 MED ORDER — FENTANYL CITRATE (PF) 100 MCG/2ML IJ SOLN: 100.0000 ug | INTRAMUSCULAR | Status: DC | PRN

## 2016-09-27 MED ORDER — OXYTOCIN BOLUS FROM INFUSION: 500.0000 mL | Freq: Once | INTRAVENOUS | Status: DC

## 2016-09-27 MED ORDER — WITCH HAZEL-GLYCERIN EX PADS: 1.0000 "application " | MEDICATED_PAD | CUTANEOUS | Status: DC | PRN

## 2016-09-27 MED ORDER — DEXAMETHASONE SODIUM PHOSPHATE 4 MG/ML IJ SOLN
INTRAMUSCULAR | Status: DC | PRN
  Administered 2016-09-27: 4 mg via INTRAVENOUS

## 2016-09-27 MED ORDER — LACTATED RINGERS IV SOLN
500.0000 mL | INTRAVENOUS | Status: DC | PRN
  Administered 2016-09-27 (×2): 500 mL via INTRAVENOUS

## 2016-09-27 MED ORDER — DIBUCAINE 1 % RE OINT: 1.0000 "application " | TOPICAL_OINTMENT | RECTAL | Status: DC | PRN

## 2016-09-27 MED ORDER — OXYCODONE-ACETAMINOPHEN 5-325 MG PO TABS
2.0000 | ORAL_TABLET | ORAL | Status: DC | PRN
  Administered 2016-09-29 – 2016-09-30 (×2): 2 via ORAL
  Filled 2016-09-27: qty 2

## 2016-09-27 SURGICAL SUPPLY — 34 items
BENZOIN TINCTURE PRP APPL 2/3 (GAUZE/BANDAGES/DRESSINGS) ×3 IMPLANT
CHLORAPREP W/TINT 26ML (MISCELLANEOUS) ×3 IMPLANT
CLAMP CORD UMBIL (MISCELLANEOUS) IMPLANT
CLOSURE WOUND 1/2 X4 (GAUZE/BANDAGES/DRESSINGS) ×1
CLOTH BEACON ORANGE TIMEOUT ST (SAFETY) ×3 IMPLANT
DRSG OPSITE POSTOP 4X10 (GAUZE/BANDAGES/DRESSINGS) ×3 IMPLANT
ELECT REM PT RETURN 9FT ADLT (ELECTROSURGICAL) ×3
ELECTRODE REM PT RTRN 9FT ADLT (ELECTROSURGICAL) ×1 IMPLANT
EXTRACTOR VACUUM M CUP 4 TUBE (SUCTIONS) IMPLANT
EXTRACTOR VACUUM M CUP 4' TUBE (SUCTIONS)
GLOVE BIOGEL PI IND STRL 7.0 (GLOVE) ×3 IMPLANT
GLOVE BIOGEL PI INDICATOR 7.0 (GLOVE) ×6
GLOVE ECLIPSE 7.0 STRL STRAW (GLOVE) ×3 IMPLANT
GOWN STRL REUS W/TWL LRG LVL3 (GOWN DISPOSABLE) ×6 IMPLANT
KIT ABG SYR 3ML LUER SLIP (SYRINGE) IMPLANT
NEEDLE HYPO 22GX1.5 SAFETY (NEEDLE) ×3 IMPLANT
NEEDLE HYPO 25X5/8 SAFETYGLIDE (NEEDLE) ×3 IMPLANT
NS IRRIG 1000ML POUR BTL (IV SOLUTION) ×3 IMPLANT
PACK C SECTION WH (CUSTOM PROCEDURE TRAY) ×3 IMPLANT
PAD ABD 7.5X8 STRL (GAUZE/BANDAGES/DRESSINGS) ×3 IMPLANT
PAD ABD 8X10 STRL (GAUZE/BANDAGES/DRESSINGS) ×3 IMPLANT
PAD OB MATERNITY 4.3X12.25 (PERSONAL CARE ITEMS) ×3 IMPLANT
PENCIL SMOKE EVAC W/HOLSTER (ELECTROSURGICAL) ×3 IMPLANT
RTRCTR C-SECT PINK 25CM LRG (MISCELLANEOUS) IMPLANT
SPONGE GAUZE 4X4 12PLY (GAUZE/BANDAGES/DRESSINGS) ×3 IMPLANT
STRIP CLOSURE SKIN 1/2X4 (GAUZE/BANDAGES/DRESSINGS) ×2 IMPLANT
SUT PDS AB 0 CTX 36 PDP370T (SUTURE) IMPLANT
SUT PLAIN 2 0 XLH (SUTURE) IMPLANT
SUT VIC AB 0 CTX 36 (SUTURE) ×4
SUT VIC AB 0 CTX36XBRD ANBCTRL (SUTURE) ×2 IMPLANT
SUT VIC AB 4-0 KS 27 (SUTURE) ×3 IMPLANT
SYR CONTROL 10ML LL (SYRINGE) ×3 IMPLANT
TOWEL OR 17X24 6PK STRL BLUE (TOWEL DISPOSABLE) ×3 IMPLANT
TRAY FOLEY CATH SILVER 14FR (SET/KITS/TRAYS/PACK) ×3 IMPLANT

## 2016-09-27 NOTE — Progress Notes (Signed)
Patient ID: Tonya Yates, female   DOB: 1985-12-17, 30 y.o.   MRN: [id number]  S: Patient seen & examined for progress of labor. Patient comfortable with epidural. Discussed placement of IUPC due to prolonged first phase of labor without change.     O:  Vitals:   09/27/16 1407 09/27/16 1431 09/27/16 1501 09/27/16 1531  BP: 96/77 110/61 121/64 108/71  Pulse: 77 79 79 89  Resp: 20 18 20 18   Temp:   98.8 F (37.1 C)   TempSrc:   Oral   SpO2:      Weight:      Height:        Dilation: 8 Effacement (%): 90 Cervical Position: Middle Station: -1 Presentation: Vertex Exam by:: dr. Omer Jackmumaw   FHT: 140 bpm, mod var, no accels, frequent early and variable decels during contractions TOCO: q2-333min  IUPC placed without difficulty.   A/P: IUPC placed Monitor FHT, if variables do not improve will start amnioinfusion Continue expectant management Anticipate SVD

## 2016-09-27 NOTE — Progress Notes (Signed)
Pt O2 Sats noted to drop low as 89% when sleeping. HR noted to drop as low as 51. Notified Dr. Greig CastillaAndrew. Ordered to place pt on 2L of 02.

## 2016-09-27 NOTE — Progress Notes (Signed)
Patient ID: Tonya Yates, female   DOB: 1986/02/18, 30 y.o.   MRN: [id number]  S:  Patient comfortable with epidural. Just had pitocin turned off due to repetitive decelerations.    O:  Vitals:   09/27/16 1631 09/27/16 1701 09/27/16 1731 09/27/16 1801  BP: 110/74 125/60 131/69 124/67  Pulse: 79 78 77 75  Resp: 18 18 18 18   Temp:  98.3 F (36.8 C)    TempSrc:  Oral    SpO2:      Weight:      Height:       Dilation: 7 Effacement (%): 80 Cervical Position: Posterior (Very edematous) Station: 0 Presentation: Vertex (+Molding +Caput +Very asynclitic) Exam by:: Dr. Macon LargeAnyanwu  FHT: 120 bpm, mod var, no accels, multiple variable decels TOCO: q2-493min until pitocin was d/c'd, now q445min and not adequate.  A/P: 6853w4d gestation, now with failure of cervical dilation and fetal intolerance of labor. On review of chart, patient had been adequate for about 2 hours prior to pitocin being stopped.   After her exam, and the cervical edema and fetal asynclitic presentation, cesarean section was recommended.  The risks of cesarean section discussed with the patient included but were not limited to: bleeding which may require transfusion or reoperation; infection which may require antibiotics; injury to bowel, bladder, ureters or other surrounding organs; injury to the fetus; need for additional procedures including hysterectomy in the event of a life-threatening hemorrhage; placental abnormalities wth subsequent pregnancies, incisional problems, thromboembolic phenomenon and other postoperative/anesthesia complications. The patient concurred with the proposed plan, giving informed written consent for the procedure.   Anesthesia and OR aware. Preoperative prophylactic antibiotics and SCDs ordered on call to the OR.  To OR when ready.  Jaynie CollinsUGONNA  Yona Kosek, MD, FACOG Attending Obstetrician & Gynecologist, [facility], [facility]

## 2016-09-27 NOTE — Anesthesia Preprocedure Evaluation (Addendum)
Anesthesia Evaluation  Patient identified by MRN, date of birth, ID band Patient awake    Reviewed: Allergy & Precautions, H&P , NPO status , Patient's Chart, lab work & pertinent test results  Airway Mallampati: II  TM Distance: >3 FB Neck ROM: full    Dental no notable dental hx. (+) Teeth Intact   Pulmonary former smoker,    Pulmonary exam normal breath sounds clear to auscultation       Cardiovascular negative cardio ROS Normal cardiovascular exam Rhythm:regular Rate:Normal     Neuro/Psych PSYCHIATRIC DISORDERS Anxiety Depression    GI/Hepatic Neg liver ROS, GERD  Medicated and Controlled,  Endo/Other  Obesity  Renal/GU negative Renal ROS  negative genitourinary   Musculoskeletal negative musculoskeletal ROS (+)   Abdominal (+) + obese,   Peds  Hematology  (+) anemia ,   Anesthesia Other Findings       Reproductive/Obstetrics (+) Pregnancy                            Lab Results  Component Value Date   WBC 13.8 (H) 09/27/2016   HGB 10.8 (L) 09/27/2016   HCT 32.3 (L) 09/27/2016   MCV 86.1 09/27/2016   PLT 239 09/27/2016    Anesthesia Physical Anesthesia Plan  ASA: III and emergent  Anesthesia Plan: Epidural   Post-op Pain Management:    Induction:   Airway Management Planned: Natural Airway  Additional Equipment:   Intra-op Plan:   Post-operative Plan:   Informed Consent: I have reviewed the patients History and Physical, chart, labs and discussed the procedure including the risks, benefits and alternatives for the proposed anesthesia with the patient or authorized representative who has indicated his/her understanding and acceptance.   Dental Advisory Given  Plan Discussed with: Anesthesiologist, CRNA and Surgeon  Anesthesia Plan Comments: (Labs checked- platelets confirmed with RN in room. Fetal heart tracing, per RN, reported to be stable enough for sitting  procedure. Discussed epidural, and patient consents to the procedure:  included risk of possible headache,backache, failed block, allergic reaction, and nerve injury. This patient was asked if she had any questions or concerns before the procedure started.   Patient for C/Section for failure to progress. Will use epidural for C/section. M. Malen GauzeFoster, MD)       Anesthesia Quick Evaluation

## 2016-09-27 NOTE — H&P (Signed)
LABOR AND DELIVERY ADMISSION HISTORY AND PHYSICAL NOTE  Tonya Yates is a 30 y.o. female [phone] with IUP at 5959w4d by 9 wk US presenting for IOL for postdates. Patient has had unremakable pregnancy, but does have history of EtOH abuse and MDD.   She reports positive fetal movement. She denies leakage of fluid or vaginal bleeding.  Prenatal History/Complications:  Past Medical History: Past Medical History:  Diagnosis Date  . Acid reflux   . Anemia   . Anxiety   . Depression   . Hypoglycemia   . Infection    UTI    Past Surgical History: Past Surgical History:  Procedure Laterality Date  . EYE SURGERY Left    stye removed    Obstetrical History: OB History    Gravida Para Term Preterm AB Living   9 5 4 1 3 4    SAB TAB Ectopic Multiple Live Births   2 1     4       Social History: Social History   Social History  . Marital status: Single    Spouse name: N/A  . Number of children: N/A  . Years of education: N/A   Social History Main Topics  . Smoking status: Former Smoker    Types: Cigarettes  . Smokeless tobacco: Never Used  . Alcohol use Yes     Comment: last alcohol was 3 months ago  . Drug use: No     Comment: Patient reports smoking marijuana in the post  . Sexual activity: Yes    Birth control/ protection: Condom   Other Topics Concern  . None   Social History Narrative  . None    Family History: Family History  Problem Relation Age of Onset  . Diabetes Maternal Grandmother   . Hearing loss Neg Hx     Allergies: No Known Allergies  Prescriptions Prior to Admission  Medication Sig Dispense Refill Last Dose  . acetaminophen (TYLENOL) 500 MG tablet Take 1,000 mg by mouth every 6 (six) hours as needed for mild pain, moderate pain or headache.   09/27/2016  . Prenatal Vit-Fe Fumarate-FA (PRENATAL MULTIVITAMIN) TABS tablet Take 1 tablet by mouth daily.   09/27/2016  . tetrahydrozoline (VISINE) 0.05 % ophthalmic solution Place 1 drop into both  eyes daily as needed (dry eyes).   09/27/2016     Review of Systems   All systems reviewed and negative except as stated in HPI  Blood pressure 103/61, pulse 75, temperature 97.7 F (36.5 C), resp. rate 16, height 5\' 1"  (1.549 m), weight 195 lb (88.5 kg), last menstrual period 12/27/2015, SpO2 98 %. General appearance: alert, cooperative and appears stated age Lungs: clear to auscultation bilaterally Heart: regular rate and rhythm Abdomen: soft, non-tender; bowel sounds normal Extremities: No calf swelling or tenderness Presentation: cephalic by nursing exam Fetal monitoring: fetal baseline 130 Uterine activity: contractions q 2 minutes Dilation: 4 Effacement (%): 60, 70 Station: -2 Exam by:: Sherrell PullerSade Harper,RN   Prenatal labs: ABO, Rh: --/--/O POS, O POS (12/23 0105) Antibody: NEG (12/23 0105) Rubella: not in records RPR: NON REAC (10/19 1549)  HBsAg: NEGATIVE (07/13 1254)  HIV: NONREACTIVE (10/19 1549)  GBS: Negative (11/28 0000)  1 hr Glucola: nl Genetic screening:  Quad negative Anatomy US: normal  Prenatal Transfer Tool  Maternal Diabetes: No Genetic Screening: Normal Maternal Ultrasounds/Referrals: Normal Fetal Ultrasounds or other Referrals:  None Maternal Substance Abuse:  Yes:  Type: Other:  EtOH Significant Maternal Medications:  None Significant Maternal Lab Results: Lab  values include: Group B Strep negative  Results for orders placed or performed during the hospital encounter of 09/27/16 (from the past 24 hour(s))  CBC   Collection Time: 09/27/16  1:05 AM  Result Value Ref Range   WBC 13.8 (H) 4.0 - 10.5 K/uL   RBC 3.75 (L) 3.87 - 5.11 MIL/uL   Hemoglobin 10.8 (L) 12.0 - 15.0 g/dL   HCT 19.132.3 (L) 47.836.0 - 29.546.0 %   MCV 86.1 78.0 - 100.0 fL   MCH 28.8 26.0 - 34.0 pg   MCHC 33.4 30.0 - 36.0 g/dL   RDW 62.114.0 30.811.5 - 65.715.5 %   Platelets 239 150 - 400 K/uL  Type and screen [facility]   Collection Time: 09/27/16  1:05 AM  Result Value Ref  Range   ABO/RH(D) O POS    Antibody Screen NEG    Sample Expiration 09/30/2016   ABO/Rh   Collection Time: 09/27/16  1:05 AM  Result Value Ref Range   ABO/RH(D) O POS     Patient Active Problem List   Diagnosis Date Noted  . Post-dates pregnancy 09/27/2016  . Alcohol use affecting pregnancy in second trimester, antepartum 04/21/2016  . Supervision of other normal pregnancy, antepartum 04/17/2016  . Depression, major, single episode, severe (HCC) 04/22/2015  . Suicidal ideations 04/22/2015  . Alcohol abuse 04/22/2015  . Major depressive disorder, recurrent episode, severe with anxious distress (HCC) 04/22/2015  . Suicidal ideation     Assessment: Tonya Halfyonna Kooyman is a 30 y.o. [phone] at 334w4d here for IOL for postdates.  #Labor:induction with pitocin. Started on admission #Pain: Plans for epidural #FWB: Category 1 #ID:  GBS negative, unable to find rubella status. Ordered #MOF: breast #MOC: interval tubal ligation #Circ:  n/A  Ernestina PennaNicholas Karagan Lehr 09/27/2016, 6:36 AM

## 2016-09-27 NOTE — Progress Notes (Signed)
Patient ID: Tonya Yates, female   DOB: January 28, 1986, 30 y.o.   MRN: [id number]  S: Patient seen & examined for progress of labor. Patient comfortable with epidural. Discussed to replace IUPC, start amnioinfusion   O:  Vitals:   09/27/16 1501 09/27/16 1531 09/27/16 1601 09/27/16 1631  BP: 121/64 108/71 125/68 110/74  Pulse: 79 89 85 79  Resp: 20 18 18 18   Temp: 98.8 F (37.1 C)     TempSrc: Oral     SpO2:      Weight:      Height:        Dilation: 8 Effacement (%): 90 Cervical Position: Anterior Station: 0 Presentation: Vertex Exam by:: s.faulk, rn  FSE placed with ease IUPC placed with ease, starting amnioinfusion  FHT: 120 bpm, mod var, no accels, multiple variable decels TOCO: q2-423min until pitocin was d/c'd, now q805min and not adequate.   A/P: IUPC placed, amnioinfusion started FSE placed Pitocin has been stopped, will restart when FHT is reactive Once pitocin restarts will reevaluate for progress of labor Continue expectant management Anticipate SVD

## 2016-09-27 NOTE — Progress Notes (Signed)
Patient ID: Paul Halfyonna Attwood, female   DOB: 06/04/1986, 30 y.o.   MRN: [id number]  S: Patient seen & examined for progress of labor. Patient comfortable with epidural. Discussed further induction with AROM, patient requested to have it done.    O:  Vitals:   09/27/16 0902 09/27/16 0931 09/27/16 1001 09/27/16 1031  BP: (!) 92/52 (!) 99/54 (!) 106/55 131/61  Pulse: 72 61 66 61  Resp: 18 18 20 18   Temp:      TempSrc:      SpO2:      Weight:      Height:        Dilation: 4.5 Effacement (%): 80 Cervical Position: Middle Station: -1 Presentation: Vertex Exam by:: s.faulk, rn   FHT: 140 bpm, mod var, +accels, occasional early/variable decels TOCO: q2-513min  AROM performed, minimal to moderate clear fluid returned. Patient and baby tolerated procedure well.  A/P: AROM performed, clear fluid returned Continue expectant management Anticipate SVD

## 2016-09-27 NOTE — Anesthesia Procedure Notes (Signed)
Epidural Patient location during procedure: OB Start time: 09/27/2016 6:58 AM End time: 09/27/2016 7:08 AM  Preanesthetic Checklist Completed: patient identified, site marked, surgical consent, pre-op evaluation, timeout performed, IV checked, risks and benefits discussed and monitors and equipment checked  Epidural Patient position: sitting Prep: site prepped and draped and DuraPrep Patient monitoring: continuous pulse ox and blood pressure Approach: midline Location: L3-L4 Injection technique: LOR air  Needle:  Needle type: Tuohy  Needle gauge: 17 G Needle length: 9 cm and 9 Needle insertion depth: 7 cm Catheter type: closed end flexible Catheter size: 19 Gauge Catheter at skin depth: 14 cm Test dose: negative  Assessment Events: blood not aspirated, injection not painful, no injection resistance, negative IV test and no paresthesia  Additional Notes Dosing of Epidural:  1st dose, through catheter ............................................Marland Kitchen.  Xylocaine 40 mg  2nd dose, through catheter, after waiting 3 minutes........Marland Kitchen.Xylocaine 60 mg    As each dose occurred, patient was free of IV sx; and patient exhibited no evidence of SA injection.  Patient is more comfortable after epidural dosed. Please see RN's note for documentation of vital signs,and FHR which are stable.  Patient reminded not to try to ambulate with numb legs, and that an RN must be present when she attempts to get up.

## 2016-09-27 NOTE — Transfer of Care (Signed)
Immediate Anesthesia Transfer of Care Note  Patient: Tonya Yates  Procedure(s) Performed: Procedure(s): CESAREAN SECTION (N/A)  Patient Location: PACU  Anesthesia Type:Epidural  Level of Consciousness: awake, alert  and oriented  Airway & Oxygen Therapy: Patient Spontanous Breathing  Post-op Assessment: Report given to RN and Post -op Vital signs reviewed and stable  Post vital signs: Reviewed and stable  Last Vitals:  Vitals:   09/27/16 1731 09/27/16 1801  BP: 131/69 124/67  Pulse: 77 75  Resp: 18 18  Temp:      Last Pain:  Vitals:   09/27/16 1701  TempSrc: Oral  PainSc:          Complications: No apparent anesthesia complications

## 2016-09-27 NOTE — Anesthesia Pain Management Evaluation Note (Signed)
  CRNA Pain Management Visit Note  Patient: Tonya Yates, 30 y.o., female  "Hello I am a member of the anesthesia [facility]. We have an anesthesia team available at all times to provide care throughout the hospital, including epidural management and anesthesia for C-section. I don't know your plan for the delivery whether it a natural birth, water birth, IV sedation, nitrous supplementation, doula or epidural, but we want to meet your pain goals."   1.Was your pain managed to your expectations on prior hospitalizations?   Yes   2.What is your expectation for pain management during this hospitalization?     Epidural  3.How can we help you reach that goal? Epidural in place at time of visit patient comfortable  Record the patient's initial score and the patient's pain goal.   Pain: 2  Pain Goal: 4 [facility] wants you to be able to say your pain was always managed very well.  [name],[name] 09/27/2016

## 2016-09-27 NOTE — Progress Notes (Signed)
Provider at bedside discussing risks and benefits of cs with pt. Pt verbalizes understanding.

## 2016-09-27 NOTE — Anesthesia Postprocedure Evaluation (Signed)
Anesthesia Post Note  Patient: Tonya Yates  Procedure(s) Performed: Procedure(s) (LRB): CESAREAN SECTION (N/A)  Patient location during evaluation: PACU Anesthesia Type: Epidural Level of consciousness: awake and alert and oriented Pain management: pain level controlled Vital Signs Assessment: post-procedure vital signs reviewed and stable Respiratory status: spontaneous breathing, nonlabored ventilation and respiratory function stable Cardiovascular status: blood pressure returned to baseline and stable Postop Assessment: no signs of nausea or vomiting, epidural receding, no backache, no headache and patient able to bend at knees Anesthetic complications: no        Last Vitals:  Vitals:   09/27/16 2110 09/27/16 2120  BP: (!) 142/75 134/79  Pulse: 71 63  Resp: 18 20  Temp:  36.7 C    Last Pain:  Vitals:   09/27/16 2120  TempSrc: Oral  PainSc:    Pain Goal:                 Kawehi Hostetter A.

## 2016-09-27 NOTE — Op Note (Signed)
Tonya Yates PROCEDURE DATE: 09/27/2016  PREOPERATIVE DIAGNOSES: Intrauterine pregnancy at 6758w4d weeks gestation; failure to progress: arrest of dilation and non-reassuring fetal status  POSTOPERATIVE DIAGNOSES: The same  PROCEDURE: Primary Low Transverse Cesarean Section  SURGEON:  Dr. Jaynie CollinsUgonna Malynda Smolinski  ASSISTANT:  Dr. Jen MowElizabeth Mumaw  ANESTHESIOLOGIST: Dr.Michael Malen GauzeFoster  INDICATIONS: Tonya Yates is a 30 y.o. [phone] at 6658w4d here for cesarean section secondary to the indications listed under preoperative diagnoses; please see preoperative note for further details.  The risks of cesarean section were discussed with the patient including but were not limited to: bleeding which may require transfusion or reoperation; infection which may require antibiotics; injury to bowel, bladder, ureters or other surrounding organs; injury to the fetus; need for additional procedures including hysterectomy in the event of a life-threatening hemorrhage; placental abnormalities wth subsequent pregnancies, incisional problems, thromboembolic phenomenon and other postoperative/anesthesia complications.   The patient concurred with the proposed plan, giving informed written consent for the procedure.    FINDINGS:  Viable female infant in cephalic presentation.  Apgars 8 and 9.  Clear amniotic fluid.  Intact placenta, three vessel cord.  Very edematous lower uterine segment with edema extending to the parametrium otherwise normal uterus, fallopian tubes and ovaries bilaterally.  ANESTHESIA: Epidural INTRAVENOUS FLUIDS: 1400 ml ESTIMATED BLOOD LOSS: 750 ml URINE OUTPUT:  100 ml SPECIMENS: Placenta sent to L&D COMPLICATIONS: None immediate  PROCEDURE IN DETAIL:  The patient preoperatively received intravenous antibiotics and had sequential compression devices applied to her lower extremities.  She was then taken to the operating room where the epidural anesthesia was dosed up to surgical level and was found to be  adequate. She was then placed in a dorsal supine position with a leftward tilt, and prepped and draped in a sterile manner.  A foley catheter was placed into her bladder and attached to constant gravity.  After an adequate timeout was performed, a Pfannenstiel skin incision was made with scalpel and carried through to the underlying layer of fascia. The fascia was incised in the midline, and this incision was extended bilaterally using the Mayo scissors.  Kocher clamps were applied to the superior aspect of the fascial incision and the underlying rectus muscles were dissected off bluntly.  A similar process was carried out on the inferior aspect of the fascial incision. The rectus muscles were separated in the midline bluntly and the peritoneum was entered bluntly. Attention was turned to the lower uterine segment where a low transverse hysterotomy was made with a scalpel and extended bilaterally bluntly.  The infant was successfully delivered, the cord was clamped and cut after one minute, and the infant was handed over to the awaiting neonatology team. Uterine massage was then administered, and the placenta delivered intact with a three-vessel cord. The uterus was then cleared of clots and debris.  The hysterotomy was closed with 0 Vicryl in a running locked fashion, and an imbricating layer was also placed with 0 Vicryl. The pelvis was cleared of all clot and debris. Hemostasis was confirmed on all surfaces.  The peritoneum was closed with a 0 Vicryl running stitch. The fascia was then closed using 0 Vicryl in a running fashion.  The subcutaneous layer was irrigated, then reapproximated with 2-0 plain gut interrupted stitches, and 30 ml of 0.5% Marcaine was injected subcutaneously around the incision.  The skin was closed with a 4-0 Vicryl subcuticular stitch. The patient tolerated the procedure well. Sponge, lap, instrument and needle counts were correct x 3.  She was  taken to the recovery room in stable  condition.     Jaynie CollinsUGONNA  Tonya Dafoe, MD, FACOG Attending Obstetrician & Gynecologist Faculty Practice, [facility]

## 2016-09-28 LAB — CBC
HEMATOCRIT: 27.2 % — AB (ref 36.0–46.0)
HEMOGLOBIN: 9.3 g/dL — AB (ref 12.0–15.0)
MCH: 29.3 pg (ref 26.0–34.0)
MCHC: 34.2 g/dL (ref 30.0–36.0)
MCV: 85.8 fL (ref 78.0–100.0)
Platelets: 246 10*3/uL (ref 150–400)
RBC: 3.17 MIL/uL — ABNORMAL LOW (ref 3.87–5.11)
RDW: 13.8 % (ref 11.5–15.5)
WBC: 23.1 10*3/uL — ABNORMAL HIGH (ref 4.0–10.5)

## 2016-09-28 NOTE — Progress Notes (Signed)
Dr. Clearance CootsAndrew Tyson on unit. Spoke with him about patient HR resting in the low 50's when sleeping. Patient asymptomatic. No new orders at this time.

## 2016-09-28 NOTE — Anesthesia Postprocedure Evaluation (Signed)
Anesthesia Post Note  Patient: Tonya Yates  Procedure(s) Performed: Procedure(s) (LRB): CESAREAN SECTION (N/A)  Patient location during evaluation: Mother Baby Anesthesia Type: Epidural Level of consciousness: awake and alert Pain management: satisfactory to patient Vital Signs Assessment: post-procedure vital signs reviewed and stable Respiratory status: respiratory function stable Cardiovascular status: stable Postop Assessment: no headache, no backache, epidural receding, patient able to bend at knees, no signs of nausea or vomiting and adequate PO intake Anesthetic complications: no        Last Vitals:  Vitals:   09/28/16 0300 09/28/16 0710  BP: (!) 102/46 (!) 111/48  Pulse: (!) 50 (!) 51  Resp: 18 18  Temp: 36.8 C 36.9 C    Last Pain:  Vitals:   09/28/16 0710  TempSrc: Oral  PainSc: 0-No pain   Pain Goal:                 Tonya Yates

## 2016-09-28 NOTE — Clinical Social Work Maternal (Signed)
  CLINICAL SOCIAL WORK MATERNAL/CHILD NOTE  Patient Details  Name: Tonya Yates MRN: [id number] Date of Birth: 1986/07/07  Date:  09/28/2016  Clinical Social Worker Initiating Note:  Ferdinand Lango Gwynne Kemnitz, MSW, LCSW-A  Date/ Time Initiated:  09/28/16/0850     Child's Name:  [facility]    Legal Guardian:  Other (Comment) (Not established by court system; MOB single parenting at this time )   Need for Interpreter:  None   Date of Referral:  09/27/16     Reason for Referral:  Other (Comment) (MOB hx of anxiety/depression and ETOH use )   Referral Source:  [employer]   Address:  [address] [place] [address]  Phone number:  [phone]   Household Members:  Self, Minor Children   Natural Supports (not living in the home):  Friends, Immediate Family, Extended Family   Professional Supports: Interior and spatial designer )   Employment: Full-time ([employer] )   Type of Work: Oceanographer    Education:  Database administrator Resources:  Medicaid   Other Resources:  [facility]   Cultural/Religious Considerations Which May Impact Care:  None reported at this time.   Strengths:  Ability to meet basic needs , Compliance with medical plan , Home prepared for child    Risk Factors/Current Problems:  None   Cognitive State:  Alert , Able to Concentrate , Goal Oriented , Insightful    Mood/Affect:  Comfortable , Calm , Interested    CSW Assessment: CSW met with MOB at bedside to complete assessment. At this time, MOB was resting in bed while baby was asleep in basinet. This Probation officer explained role and reasoning for visit being due to her hx of anxiety/depressiong and ETOH use. 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 MOB notes she see's a therapist at [facility]. She further notes she has been prescribed Vistaril, Zoloft, and Xanax. MOB notes  she will continue on Vistaril to control anxiety and continue seeing her therapist to help manage depression. This Probation officer discussed PPD with MOB and the importance of seeking treatment if she begins to feel a onset of symptoms. MOB verbalized understanding. At this time, MOB notes her ETOH use is recreational and she does not have a problem with it. She denys the needs for resources regarding it. At this time, MOB notes she has no further needs. Case closed to this CSW.    CSW Plan/Description:  No Further Intervention Required/No Barriers to Discharge    [place], LCSW 09/28/2016, 8:53 AM

## 2016-09-28 NOTE — Addendum Note (Signed)
Addendum  created 09/28/16 [id number] by Graciela HusbandsWynn O Cledith Abdou, CRNA   Sign clinical note

## 2016-09-28 NOTE — Lactation Note (Addendum)
This note was copied from a baby's chart. Lactation Consultation Note  Experienced BF mother reports that BF is going well. A LATCH score has not been done in several hours. Asked mom to called for staff to observe latch at the next feeding.  Hand expression taught with colostrum visible.  Mother had questions about BF and alcohol use (one glass of wine). Advised her breast feed prior to alcohol consumption and to wait until she feels no alcohol effects before BF again. Social work note states that she plans to continue Visteral for anxiety. It is an L2.  Information given on support groups and outpatient services. Follow-up tomorrow.  Patient Name: Tonya Yates Tonya Yates ZOXWR'UToday's Date: 09/28/2016 Reason for consult: Initial assessment   Maternal Data Has patient been taught Hand Expression?: Yes Does the patient have breastfeeding experience prior to this delivery?: Yes  Feeding Feeding Type: Breast Fed Length of feed: 20 min  LATCH Score/Interventions                      Lactation Tools Discussed/Used     Consult Status Consult Status: Follow-up Date: 09/29/16 Follow-up type: In-patient    Tonya DryerJoseph, Rjay Yates 09/28/2016, 6:19 PM

## 2016-09-28 NOTE — Progress Notes (Signed)
Post Partum Day 1 Subjective: Patient reports being fatigued. Has not been up out of bed because she's so tired. Reports that she has daytime fatigue at baseline and a strong family history of OSA. Complains of itching, but nurses wouldn't give her medicine due to her low heart rate.  Objective: Blood pressure (!) 111/48, pulse (!) 51, temperature 98.4 F (36.9 C), temperature source Oral, resp. rate 18, height 5\' 1"  (1.549 m), weight 195 lb (88.5 kg), last menstrual period 12/27/2015, SpO2 97 %, unknown if currently breastfeeding.  Physical Exam:  General: cooperative, fatigued, no distress and slowed mentation Lochia: appropriate Uterine Fundus: firm Incision: healing well, no significant drainage, no significant erythema DVT Evaluation: No evidence of DVT seen on physical exam.   Recent Labs  09/27/16 0105  HGB 10.8*  HCT 32.3*    Assessment/Plan: Plan for discharge tomorrow  f/u CBC   LOS: 1 day   Clearance Cootsndrew Joneric Streight 09/28/2016, 7:34 AM

## 2016-09-29 ENCOUNTER — Encounter (HOSPITAL_COMMUNITY): Payer: Self-pay | Admitting: Obstetrics & Gynecology

## 2016-09-29 MED ORDER — CALCIUM CARBONATE ANTACID 500 MG PO CHEW
2.0000 | CHEWABLE_TABLET | ORAL | Status: DC | PRN
  Administered 2016-09-29: 400 mg via ORAL
  Filled 2016-09-29: qty 2

## 2016-09-29 NOTE — Progress Notes (Signed)
UR chart review completed.  

## 2016-09-29 NOTE — Progress Notes (Signed)
Post Partum Day 2 Subjective: Pain controlled. Ambulating, tolerating oral intake. Reports flatus. Baby doing well.   Objective: Blood pressure (!) 115/47, pulse 71, temperature 98.4 F (36.9 C), temperature source Oral, resp. rate 18, height 5\' 1"  (1.549 m), weight 195 lb (88.5 kg), last menstrual period 12/27/2015, SpO2 100 %, unknown if currently breastfeeding.  Physical Exam:  General: alert and no distress Lochia: appropriate Uterine Fundus: firm Incision: healing well, no significant drainage, no significant erythema DVT Evaluation: No evidence of DVT seen on physical exam.   Recent Labs  09/27/16 0105 09/28/16 0821  HGB 10.8* 9.3*  HCT 32.3* 27.2*    Assessment/Plan: Plan for discharge tomorrow as per patient's preference Oral iron therapy ordered Encouraged OOB Bottlefeeding, unsure contraception Routine postpartum care    LOS: 2 days   Tonya CollinsUgonna Ellison Rieth, MD 09/29/2016, 9:45 AM

## 2016-09-29 NOTE — Plan of Care (Signed)
Problem: Pain Managment: Goal: General experience of comfort will improve Provided Kpad for gas pains, warm fluids and encouraged ambulation.

## 2016-09-29 NOTE — Lactation Note (Signed)
This note was copied from a baby's chart. Lactation Consultation Note  Patient Name: Tonya Yates ZOXWR'UToday's Date: 09/29/2016 Reason for consult: Follow-up assessment Baby at 46 hr of life. Upon entry baby was sleeping. Mom stated baby just finished bf. She reports baby has been latching well. She denies breast or nipple pain. She asked if there was a blood test that could be done to tell how much she is making. Discussed baby behavior, feeding frequency, baby belly size, voids, wt loss, breast changes, and nipple care. There was a pink pacifier on the table, discussed artificial nipple use. She stated she can manually express and has a spoon in the room. Mom is aware of lactation services and support group. She will call as needed.     Maternal Data    Feeding Feeding Type: Breast Fed Length of feed: 25 min  LATCH Score/Interventions                      Lactation Tools Discussed/Used     Consult Status Consult Status: Follow-up Date: 09/30/16 Follow-up type: In-patient    Rulon Eisenmengerlizabeth E Martin Belling 09/29/2016, 5:24 PM

## 2016-09-29 NOTE — Progress Notes (Signed)
Mother randomly stated that she felt "a little depressed." When she was asked about what she stated she was depressed about her body image after having a baby. Reassured her how long it took to grow the baby and the changes that occurred during that time and that it will take some time to get back to normal. Also reassured her that breast feeding like she was would help that even more so. Asked mother if she would like medication, and informed her that her doctor could be called and she be started on something right away. Patient stated that she really just wanted to talk to someone and someone in the clinic that she has spoken to has been helpful in the past. She also states that she sees someone regularly for her anxiety and as long as she could speak to them after discharge, she believed that she would be ok. Patient worried that her medicaid will not cover medication until the first of the year and she will not be able to pay for it. Reminded mother that the first of the year is right around the corner and that it was important to get help before any depression escalated. Patient declined need for medication now; however encouraged patient to speak with doctor when they made rounds to discuss medication or outpatient follow up. Earl Galasborne, Linda HedgesStefanie BraytonHudspeth

## 2016-09-30 MED ORDER — FERROUS SULFATE 325 (65 FE) MG PO TABS: 325.0000 mg | ORAL_TABLET | Freq: Two times a day (BID) | ORAL | 3 refills | Status: DC

## 2016-09-30 MED ORDER — IBUPROFEN 600 MG PO TABS: 600.0000 mg | ORAL_TABLET | Freq: Four times a day (QID) | ORAL | 0 refills | Status: DC | PRN

## 2016-09-30 MED ORDER — HYDROMORPHONE HCL 2 MG PO TABS: 2.0000 mg | ORAL_TABLET | ORAL | 0 refills | Status: DC | PRN

## 2016-09-30 MED ORDER — BISACODYL 10 MG RE SUPP
10.0000 mg | Freq: Once | RECTAL | Status: AC
Start: 2016-09-30 — End: 2016-09-30
  Administered 2016-09-30: 10 mg via RECTAL
  Filled 2016-09-30: qty 1

## 2016-09-30 NOTE — Discharge Instructions (Signed)

## 2016-09-30 NOTE — Discharge Summary (Signed)
OB Discharge Summary     Patient Name: Tonya Yates DOB: 1986-01-05 MRN: [id number]  Date of admission: 09/27/2016 Delivering MD: Jaynie CollinsANYANWU, UGONNA A   Date of discharge: 09/30/2016  Admitting diagnosis: 40wks, induction Intrauterine pregnancy: 4747w4d     Secondary diagnosis:  Active Problems:   Post-dates pregnancy   S/P cesarean section  Csection for failure to progress: arrest of dilation and non-reassuring fetal status  Additional problems: none     Discharge diagnosis: Term Pregnancy Delivered                                                                                                Post partum procedures:None; declined BTL  Augmentation: AROM and Pitocin  Complications: failure to progress: arrest of dilation and non-reassuring fetal status   Hospital course:  Induction of Labor With Cesarean Section  30 y.o. yo [id number] at 2747w4d was admitted to the hospital 09/27/2016 for induction of labor. Patient had a labor course significant for failure to progress. The patient went for cesarean section due to Arrest of Dilation and Non-Reassuring FHR, and delivered a Viable infant,Membrane Rupture Time/Date: )11:06 AM ,09/27/2016   @Details  of operation can be found in separate operative Note.  Patient had an uncomplicated postpartum course. She is ambulating, tolerating a regular diet, passing flatus, and urinating well.  Patient is discharged home in stable condition on 09/30/16.                                     Physical exam Vitals:   09/28/16 2300 09/29/16 0500 09/29/16 1730 09/30/16 0550  BP: (!) 116/53 (!) 115/47 135/71 123/77  Pulse: 75 71 88 72  Resp: 18 18 18 16   Temp: 98.3 F (36.8 C) 98.4 F (36.9 C) 98.3 F (36.8 C) 97.9 F (36.6 C)  TempSrc: Axillary Oral Oral Oral  SpO2: 100%     Weight:      Height:       General: alert, cooperative and appears stated age CVS:  RRR, without murmur, gallops, or rubs Lungs:  CTA bilat ABD:  +BSx4, normal Lochia:  appropriate Uterine Fundus: firm Incision: no significant drainage, dressing clean, dry, and intact DVT Evaluation: No evidence of DVT seen on physical exam. Negative Homan's sign.  Labs: Lab Results  Component Value Date   WBC 23.1 (H) 09/28/2016   HGB 9.3 (L) 09/28/2016   HCT 27.2 (L) 09/28/2016   MCV 85.8 09/28/2016   PLT 246 09/28/2016   CMP Latest Ref Rng & Units 08/29/2016  Glucose 65 - 99 mg/dL 147(W110(H)  BUN 6 - 20 mg/dL 7  Creatinine 2.950.44 - 6.211.00 mg/dL 3.080.49  Sodium 657135 - 846145 mmol/L 135  Potassium 3.5 - 5.1 mmol/L 3.5  Chloride 101 - 111 mmol/L 105  CO2 22 - 32 mmol/L 24  Calcium 8.9 - 10.3 mg/dL 9.9  Total Protein 6.5 - 8.1 g/dL 5.9(L)  Total Bilirubin 0.3 - 1.2 mg/dL 0.4  Alkaline Phos 38 - 126 U/L 104  AST 15 - 41 U/L  14(L)  ALT 14 - 54 U/L 10(L)    Discharge instruction: per After Visit Summary and "Baby and Me Booklet".  After visit meds:  Allergies as of 09/30/2016   No Known Allergies     Medication List    STOP taking these medications   acetaminophen 500 MG tablet Commonly known as:  TYLENOL     TAKE these medications   ferrous sulfate 325 (65 FE) MG tablet Take 1 tablet (325 mg total) by mouth 2 (two) times daily with a meal.   HYDROmorphone 2 MG tablet Commonly known as:  DILAUDID Take 1 tablet (2 mg total) by mouth every 4 (four) hours as needed for severe pain.   ibuprofen 600 MG tablet Commonly known as:  ADVIL,MOTRIN Take 1 tablet (600 mg total) by mouth every 6 (six) hours as needed for mild pain.   prenatal multivitamin Tabs tablet Take 1 tablet by mouth daily.       Diet: routine diet  Activity: Advance as tolerated. Pelvic rest for 6 weeks.   Prescriptions: Ibuprofen, dilaudid, and Ferrous sulfate.  Outpatient follow up:6 weeks Follow up Appt:No future appointments. Follow up Visit:No Follow-up on file.  Postpartum contraception: Undecided  Newborn Data: Live born female  Birth Weight: 7 lb 15.7 oz (3620 g) APGAR:  8, 9  Baby Feeding: Breast Disposition:home with mother   09/30/2016 Tonya Yates, CNM

## 2016-09-30 NOTE — Lactation Note (Signed)
This note was copied from a baby's chart. Lactation Consultation Notes  Discussed breastfeeding on both breasts per feeding and feeding longer than 10 min. Reviewed waking techniques. Mom encouraged to feed baby 8-12 times/24 hours and with feeding cues.  Reviewed engorgement care and monitoring voids/stools.   Patient Name: Tonya Yates UEAVW'UToday's Date: 09/30/2016 Reason for consult: Follow-up assessment   Maternal Data    Feeding Feeding Type: Breast Fed Length of feed: 10 min  LATCH Score/Interventions                      Lactation Tools Discussed/Used     Consult Status Consult Status: Complete    Hardie PulleyBerkelhammer, Alexyia Guarino Boschen 09/30/2016, 9:05 AM

## 2016-09-30 NOTE — Progress Notes (Signed)
Pt c/o constipation. MD notified. Orders received.

## 2016-09-30 NOTE — Progress Notes (Signed)
CSW received call from Pediatrician regarding initial CSW assessment note not addressing where MOB's three other children are and why they are not with her.  CSW met with MOB to address this concern, initially asking how her night went and how she is feeling emotionally today.  MOB appeared annoyed and stated that she is fine.  CSW inquired about her other children and MOB became very defensive, stating she didn't like "all these questions" and that, "I don't have to talk to anyone."  CSW explained role in the hospital and reason for asking about her children.  She calmed somewhat and explained that her children were all here with her, but that her three oldest are now living with family in [place] because "my boys (ages 73 and 50) were giving me a hard time."  She explained that family is helping and that she plans for them to return to [facility] when she can secure a bigger place to live.  Her daughter (1st grade) was in the room with her and appeared well cared for and happy.  MOB states she decided to move to [place] during the pregnancy and stayed for two months, but returned to [place] in order to return to her job at [employer].  She notes that she has good family support in [place] as well as locally.  She states no concerns, questions, or needs.  CSW identifies no barriers to discharge.

## 2016-10-02 ENCOUNTER — Inpatient Hospital Stay (HOSPITAL_COMMUNITY)
Admission: AD | Admit: 2016-10-02 | Discharge: 2016-10-02 | Disposition: A | Discharge status: Home / Self Care | Payer: Medicaid Other | Source: Ambulatory Visit | Attending: Obstetrics and Gynecology | Admitting: Obstetrics and Gynecology

## 2016-10-02 ENCOUNTER — Encounter (HOSPITAL_COMMUNITY): Payer: Self-pay

## 2016-10-02 DIAGNOSIS — Z79899 Other long term (current) drug therapy: Secondary | ICD-10-CM | POA: Insufficient documentation

## 2016-10-02 DIAGNOSIS — B9789 Other viral agents as the cause of diseases classified elsewhere: Secondary | ICD-10-CM | POA: Diagnosis not present

## 2016-10-02 DIAGNOSIS — Z87891 Personal history of nicotine dependence: Secondary | ICD-10-CM | POA: Insufficient documentation

## 2016-10-02 DIAGNOSIS — J069 Acute upper respiratory infection, unspecified: Secondary | ICD-10-CM | POA: Diagnosis not present

## 2016-10-02 DIAGNOSIS — R51 Headache: Secondary | ICD-10-CM | POA: Diagnosis present

## 2016-10-02 LAB — CBC
HEMATOCRIT: 25.2 % — AB (ref 36.0–46.0)
Hemoglobin: 8.1 g/dL — ABNORMAL LOW (ref 12.0–15.0)
MCH: 28.7 pg (ref 26.0–34.0)
MCHC: 32.1 g/dL (ref 30.0–36.0)
MCV: 89.4 fL (ref 78.0–100.0)
Platelets: 271 10*3/uL (ref 150–400)
RBC: 2.82 MIL/uL — AB (ref 3.87–5.11)
RDW: 13.7 % (ref 11.5–15.5)
WBC: 9 10*3/uL (ref 4.0–10.5)

## 2016-10-02 LAB — URINALYSIS, ROUTINE W REFLEX MICROSCOPIC
Bilirubin Urine: NEGATIVE
Glucose, UA: NEGATIVE mg/dL
HGB URINE DIPSTICK: NEGATIVE
Ketones, ur: NEGATIVE mg/dL
Leukocytes, UA: NEGATIVE
NITRITE: NEGATIVE
Protein, ur: NEGATIVE mg/dL
SPECIFIC GRAVITY, URINE: 1.012 (ref 1.005–1.030)
pH: 8 (ref 5.0–8.0)

## 2016-10-02 LAB — COMPREHENSIVE METABOLIC PANEL
ALT: 41 U/L (ref 14–54)
AST: 29 U/L (ref 15–41)
Albumin: 2.5 g/dL — ABNORMAL LOW (ref 3.5–5.0)
Alkaline Phosphatase: 108 U/L (ref 38–126)
Anion gap: 5 (ref 5–15)
BILIRUBIN TOTAL: 0.2 mg/dL — AB (ref 0.3–1.2)
BUN: 8 mg/dL (ref 6–20)
CO2: 27 mmol/L (ref 22–32)
CREATININE: 0.56 mg/dL (ref 0.44–1.00)
Calcium: 9.3 mg/dL (ref 8.9–10.3)
Chloride: 106 mmol/L (ref 101–111)
GFR calc Af Amer: >60 mL/min (ref >60)
Glucose, Bld: 100 mg/dL — ABNORMAL HIGH (ref 65–99)
Potassium: 4.2 mmol/L (ref 3.5–5.1)
Sodium: 138 mmol/L (ref 135–145)
TOTAL PROTEIN: 5.8 g/dL — AB (ref 6.5–8.1)

## 2016-10-02 MED ORDER — PSEUDOEPHEDRINE HCL 30 MG PO TABS
30.0000 mg | ORAL_TABLET | Freq: Once | ORAL | Status: AC
  Administered 2016-10-02: 30 mg via ORAL
  Filled 2016-10-02: qty 1

## 2016-10-02 MED ORDER — KETOROLAC TROMETHAMINE 60 MG/2ML IM SOLN
60.0000 mg | Freq: Once | INTRAMUSCULAR | Status: AC
  Administered 2016-10-02: 60 mg via INTRAMUSCULAR
  Filled 2016-10-02: qty 2

## 2016-10-02 NOTE — MAU Provider Note (Signed)
History     CSN: [id number]  Arrival date and time: 10/02/16 [id number]   First Provider Initiated Contact with Patient 10/02/16 0141      Chief Complaint  Patient presents with  . Headache   Tonya Yates is a 30 y.o. [id number] who is S/P c-section on 09/27/16. She states that when she went home she started to become "very sick", and she has had nasal congestion since. She has tried OTC cold medicine, but it has not helped. Today she has a headache that has not gotten better with tylenol and ibuprofen. She tried one dose of Nyquil earlier in the day, but it did not help.    Headache   This is a new problem. The current episode started yesterday. The problem occurs constantly. The problem has been unchanged. The pain is located in the right unilateral region. The pain does not radiate. The quality of the pain is described as aching. The pain is at a severity of 10/10. Associated symptoms include eye pain, rhinorrhea and sinus pressure. Pertinent negatives include no abdominal pain, fever, nausea, sore throat or vomiting. Nothing aggravates the symptoms. She has tried acetaminophen and NSAIDs (took nyquil today as well. ) for the symptoms. The treatment provided mild relief.   Past Medical History:  Diagnosis Date  . Acid reflux   . Anemia   . Anxiety   . Depression   . Hypoglycemia   . Infection    UTI    Past Surgical History:  Procedure Laterality Date  . CESAREAN SECTION N/A 09/27/2016   Procedure: CESAREAN SECTION;  Surgeon: Tereso NewcomerUgonna A Anyanwu, MD;  Location: [facility];  Service: Obstetrics;  Laterality: N/A;  . EYE SURGERY Left    stye removed    Family History  Problem Relation Age of Onset  . Diabetes Maternal Grandmother   . Hearing loss Neg Hx     Social History  Substance Use Topics  . Smoking status: Former Smoker    Types: Cigarettes  . Smokeless tobacco: Never Used  . Alcohol use Yes     Comment: last alcohol was 3 months ago    Allergies: No Known  Allergies  Prescriptions Prior to Admission  Medication Sig Dispense Refill Last Dose  . ferrous sulfate 325 (65 FE) MG tablet Take 1 tablet (325 mg total) by mouth 2 (two) times daily with a meal. 30 tablet 3   . HYDROmorphone (DILAUDID) 2 MG tablet Take 1 tablet (2 mg total) by mouth every 4 (four) hours as needed for severe pain. 30 tablet 0   . ibuprofen (ADVIL,MOTRIN) 600 MG tablet Take 1 tablet (600 mg total) by mouth every 6 (six) hours as needed for mild pain. 30 tablet 0   . Prenatal Vit-Fe Fumarate-FA (PRENATAL MULTIVITAMIN) TABS tablet Take 1 tablet by mouth daily.    09/26/2016 at Unknown time    Review of Systems  Constitutional: Negative for chills and fever.  HENT: Positive for rhinorrhea and sinus pressure. Negative for sore throat.   Eyes: Positive for pain.  Gastrointestinal: Negative for abdominal pain, nausea and vomiting.  Neurological: Positive for headaches.   Physical Exam   Blood pressure 136/71, pulse 66, temperature 98.4 F (36.9 C), temperature source Oral, resp. rate 19, last menstrual period 12/27/2015, SpO2 100 %, unknown if currently breastfeeding.  Physical Exam  Nursing note and vitals reviewed. Constitutional: She is oriented to person, place, and time. She appears well-developed and well-nourished. No distress.  HENT:  Head: Normocephalic.  Cardiovascular:  Normal rate.   Respiratory: Effort normal.  GI: Soft. There is no tenderness. There is no rebound.  Neurological: She is alert and oriented to person, place, and time.  Skin: Skin is warm and dry.  Psychiatric: She has a normal mood and affect.    Results for orders placed or performed during the hospital encounter of 10/02/16 (from the past 24 hour(s))  Urinalysis, Routine w reflex microscopic     Status: None   Collection Time: 10/02/16  1:14 AM  Result Value Ref Range   Color, Urine YELLOW YELLOW   APPearance CLEAR CLEAR   Specific Gravity, Urine 1.012 1.005 - 1.030   pH 8.0 5.0 - 8.0    Glucose, UA NEGATIVE NEGATIVE mg/dL   Hgb urine dipstick NEGATIVE NEGATIVE   Bilirubin Urine NEGATIVE NEGATIVE   Ketones, ur NEGATIVE NEGATIVE mg/dL   Protein, ur NEGATIVE NEGATIVE mg/dL   Nitrite NEGATIVE NEGATIVE   Leukocytes, UA NEGATIVE NEGATIVE  CBC     Status: Abnormal   Collection Time: 10/02/16  1:55 AM  Result Value Ref Range   WBC 9.0 4.0 - 10.5 K/uL   RBC 2.82 (L) 3.87 - 5.11 MIL/uL   Hemoglobin 8.1 (L) 12.0 - 15.0 g/dL   HCT 78.225.2 (L) 95.636.0 - 21.346.0 %   MCV 89.4 78.0 - 100.0 fL   MCH 28.7 26.0 - 34.0 pg   MCHC 32.1 30.0 - 36.0 g/dL   RDW 08.613.7 57.811.5 - 46.915.5 %   Platelets 271 150 - 400 K/uL  Comprehensive metabolic panel     Status: Abnormal   Collection Time: 10/02/16  1:55 AM  Result Value Ref Range   Sodium 138 135 - 145 mmol/L   Potassium 4.2 3.5 - 5.1 mmol/L   Chloride 106 101 - 111 mmol/L   CO2 27 22 - 32 mmol/L   Glucose, Bld 100 (H) 65 - 99 mg/dL   BUN 8 6 - 20 mg/dL   Creatinine, Ser 6.290.56 0.44 - 1.00 mg/dL   Calcium 9.3 8.9 - 52.810.3 mg/dL   Total Protein 5.8 (L) 6.5 - 8.1 g/dL   Albumin 2.5 (L) 3.5 - 5.0 g/dL   AST 29 15 - 41 U/L   ALT 41 14 - 54 U/L   Alkaline Phosphatase 108 38 - 126 U/L   Total Bilirubin 0.2 (L) 0.3 - 1.2 mg/dL   GFR calc non Af Amer >60 >60 mL/min   GFR calc Af Amer >60 >60 mL/min   Anion gap 5 5 - 15    MAU Course  Procedures  MDM Patient has had sudafed and toradol. She reports that her headache has improved.   B/P normal, labs normal. Headache is likely from nasal congestion D/T URI.  Assessment and Plan   1. Viral URI    DC home Comfort measures reviewed  OTC supportive care RX: none  Return to MAU as needed FU with OB as planned  Follow-up Information    [facility]   Specialty:  Obstetrics and Gynecology Contact information: [address] [place] [phone] [phone]           Tawnya CrookHogan, [place] Frith Donovan 10/02/2016, 1:47 AM

## 2016-10-02 NOTE — MAU Note (Signed)
Pt reports a really bad headache on the right side that meds are not helping. Also reports weakness, dizziness, and congestion.

## 2016-10-02 NOTE — Discharge Instructions (Signed)
Upper Respiratory Infection, Adult Most upper respiratory infections (URIs) are caused by a virus. A URI affects the nose, throat, and upper air passages. The most common type of URI is often called "the common cold." Follow these instructions at home:  Take medicines only as told by your doctor.  Gargle warm saltwater or take cough drops to comfort your throat as told by your doctor.  Use a warm mist humidifier or inhale steam from a shower to increase air moisture. This may make it easier to breathe.  Drink enough fluid to keep your pee (urine) clear or pale yellow.  Eat soups and other clear broths.  Have a healthy diet.  Rest as needed.  Go back to work when your fever is gone or your doctor says it is okay.  You may need to stay home longer to avoid giving your URI to others.  You can also wear a face mask and wash your hands often to prevent spread of the virus.  Use your inhaler more if you have asthma.  Do not use any tobacco products, including cigarettes, chewing tobacco, or electronic cigarettes. If you need help quitting, ask your doctor. Contact a doctor if:  You are getting worse, not better.  Your symptoms are not helped by medicine.  You have chills.  You are getting more short of breath.  You have brown or red mucus.  You have yellow or brown discharge from your nose.  You have pain in your face, especially when you bend forward.  You have a fever.  You have puffy (swollen) neck glands.  You have pain while swallowing.  You have white areas in the back of your throat. Get help right away if:  You have very bad or constant:  Headache.  Ear pain.  Pain in your forehead, behind your eyes, and over your cheekbones (sinus pain).  Chest pain.  You have long-lasting (chronic) lung disease and any of the following:  Wheezing.  Long-lasting cough.  Coughing up blood.  A change in your usual mucus.  You have a stiff neck.  You have  changes in your:  Vision.  Hearing.  Thinking.  Mood. This information is not intended to replace advice given to you by your health care provider. Make sure you discuss any questions you have with your health care provider. Document Released: 03/10/2008 Document Revised: 05/25/2016 Document Reviewed: 12/28/2013 Elsevier Interactive Patient Education  2017 [employer]  

## 2016-10-03 ENCOUNTER — Observation Stay (HOSPITAL_COMMUNITY)
Admission: EM | Admit: 2016-10-03 | Discharge: 2016-10-04 | Disposition: A | Discharge status: Home / Self Care | Payer: Medicaid Other | Attending: Family Medicine | Admitting: Family Medicine

## 2016-10-03 ENCOUNTER — Encounter (HOSPITAL_COMMUNITY): Payer: Self-pay | Admitting: Neurology

## 2016-10-03 ENCOUNTER — Emergency Department (HOSPITAL_COMMUNITY): Payer: Medicaid Other

## 2016-10-03 DIAGNOSIS — R51 Headache: Secondary | ICD-10-CM | POA: Insufficient documentation

## 2016-10-03 DIAGNOSIS — O99019 Anemia complicating pregnancy, unspecified trimester: Secondary | ICD-10-CM

## 2016-10-03 DIAGNOSIS — O0940 Supervision of pregnancy with grand multiparity, unspecified trimester: Secondary | ICD-10-CM

## 2016-10-03 DIAGNOSIS — R0600 Dyspnea, unspecified: Secondary | ICD-10-CM | POA: Diagnosis present

## 2016-10-03 DIAGNOSIS — R079 Chest pain, unspecified: Secondary | ICD-10-CM | POA: Diagnosis not present

## 2016-10-03 DIAGNOSIS — R0609 Other forms of dyspnea: Secondary | ICD-10-CM | POA: Diagnosis not present

## 2016-10-03 DIAGNOSIS — Z98891 History of uterine scar from previous surgery: Secondary | ICD-10-CM | POA: Diagnosis not present

## 2016-10-03 DIAGNOSIS — Z87891 Personal history of nicotine dependence: Secondary | ICD-10-CM | POA: Insufficient documentation

## 2016-10-03 LAB — URINALYSIS, ROUTINE W REFLEX MICROSCOPIC
Bacteria, UA: NONE SEEN
Bilirubin Urine: NEGATIVE
Glucose, UA: NEGATIVE mg/dL
Ketones, ur: NEGATIVE mg/dL
Leukocytes, UA: NEGATIVE
Nitrite: NEGATIVE
Protein, ur: NEGATIVE mg/dL
SPECIFIC GRAVITY, URINE: 1.013 (ref 1.005–1.030)
SQUAMOUS EPITHELIAL / LPF: NONE SEEN
pH: 8 (ref 5.0–8.0)

## 2016-10-03 LAB — CBC
HCT: 28.8 % — ABNORMAL LOW (ref 36.0–46.0)
Hemoglobin: 9.3 g/dL — ABNORMAL LOW (ref 12.0–15.0)
MCH: 28.3 pg (ref 26.0–34.0)
MCHC: 32.3 g/dL (ref 30.0–36.0)
MCV: 87.5 fL (ref 78.0–100.0)
PLATELETS: 396 10*3/uL (ref 150–400)
RBC: 3.29 MIL/uL — ABNORMAL LOW (ref 3.87–5.11)
RDW: 13.9 % (ref 11.5–15.5)
WBC: 11.3 10*3/uL — AB (ref 4.0–10.5)

## 2016-10-03 LAB — BASIC METABOLIC PANEL
Anion gap: 5 (ref 5–15)
BUN: 7 mg/dL (ref 6–20)
CO2: 24 mmol/L (ref 22–32)
CREATININE: 0.6 mg/dL (ref 0.44–1.00)
Calcium: 9.6 mg/dL (ref 8.9–10.3)
Chloride: 111 mmol/L (ref 101–111)
GFR calc Af Amer: >60 mL/min (ref >60)
Glucose, Bld: 92 mg/dL (ref 65–99)
Potassium: 4 mmol/L (ref 3.5–5.1)
SODIUM: 140 mmol/L (ref 135–145)

## 2016-10-03 LAB — BRAIN NATRIURETIC PEPTIDE: B Natriuretic Peptide: 255.9 pg/mL — ABNORMAL HIGH (ref 0.0–100.0)

## 2016-10-03 LAB — TROPONIN I: Troponin I: <0.03 ng/mL (ref <0.03)

## 2016-10-03 MED ORDER — KETOROLAC TROMETHAMINE 30 MG/ML IJ SOLN
30.0000 mg | Freq: Once | INTRAMUSCULAR | Status: AC
  Administered 2016-10-03: 30 mg via INTRAVENOUS
  Filled 2016-10-03: qty 1

## 2016-10-03 NOTE — H&P (Signed)
History and Physical    Tonya Yates ZOX:[id number] DOB: Apr 02, 1986 DOA: 10/03/2016  PCP: [facility] Consultants:  None Patient coming from: home - lives with 31yo daughter and new baby; FOB not involved; NOK: mother, Angelique Blonder [phone]  Chief Complaint: SOB  HPI: Tonya Yates is a 30 y.o. female with medical history significant of recent LTCS (patient is G9P5) presenting with not feeling well.  Had LTCS on 12/23 and started having right-sided chest pain that same day of the C-section after the anesthesia started wearing off.  She told the doctor, who didn't seem overly concerned.  She noticed that her HR monitor "kept going off" and they kept encouraging her to move around and had her do breathing exercises to try to get the heart rate up - "so they just turned it off" since it kept going off.  Doctor encouraged her to have her HR more looked into as an outpatient.  She does remember having LE edema in the hospital and the nurses told her it was normal.  Since coming home, very SOB with going upstairs and walking around.  Felt like she would pass out going up the stairs.  When going to sleep, she feels like she is hallucinating and her chest feels weird.  Very tired, feels like she never gets enough rest.  Has been propping herself up on 5 pillows to sleep because she feels better when sitting up; however, she has always slept on a lot of pillows.  +Headache - went to [facility] and got a shot of Toradol for this.  Feels stuffed up, like she might be getting a cold.  Patient is breast feeding.   ED Course: Per PA-C Humes: 30 year old female presents to the emergency department for evaluation of chest pain. Chest pain has been present since C-section delivery 6 days ago. Patient also reporting some shortness of breath as well as a 5 pillow orthopnea. She reports some intermittent lower extremity swelling, though she has no signs of significant fluid overload on exam. No JVD. BNP is mildly elevated  and chest x-ray shows a mild cardiomegaly compared to prior imaging. 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 I believe the patient would benefit from an echocardiogram in the morning. [facility] to admit for observation and imaging in the AM. Patient agreeable to plan; VSS.   Review of Systems: As per HPI; otherwise 10 point review of systems reviewed and negative.   Ambulatory Status:  ambulates without assistance  Past Medical History:  Diagnosis Date  . Acid reflux   . Anemia   . Anxiety   . Depression   . Hypoglycemia   . Infection    UTI    Past Surgical History:  Procedure Laterality Date  . CESAREAN SECTION N/A 09/27/2016   Procedure: CESAREAN SECTION;  Surgeon: Tereso Newcomer, MD;  Location: [facility];  Service: Obstetrics;  Laterality: N/A;  . EYE SURGERY Left    stye removed    Social History   Social History  . Marital status: Single    Spouse name: N/A  . Number of children: N/A  . Years of education: N/A   Occupational History  . Engineer, petroleum    Social History Main Topics  . Smoking status: Former Smoker    Types: Cigarettes  . Smokeless tobacco: Never Used     Comment: quit about 6 months pregnant  . Alcohol use Yes     Comment: rare  . Drug use: No  Comment: Patient reports smoking marijuana in the post  . Sexual activity: Yes    Birth control/ protection: Condom   Other Topics Concern  . Not on file   Social History Narrative  . No narrative on file    No Known Allergies  Family History  Problem Relation Age of Onset  . Diabetes Maternal Grandmother   . Congestive Heart Failure Maternal Grandmother   . Hearing loss Neg Hx     Prior to Admission medications   Medication Sig Start Date End Date Taking? Authorizing Provider  ferrous sulfate 325 (65 FE) MG tablet Take 1 tablet (325 mg total) by mouth 2 (two) times daily with a meal. 09/30/16   Marlis EdelsonWalidah N Karim, CNM  HYDROmorphone (DILAUDID) 2 MG tablet Take  1 tablet (2 mg total) by mouth every 4 (four) hours as needed for severe pain. 09/30/16   Marlis EdelsonWalidah N Karim, CNM  ibuprofen (ADVIL,MOTRIN) 600 MG tablet Take 1 tablet (600 mg total) by mouth every 6 (six) hours as needed for mild pain. 09/30/16   Marlis EdelsonWalidah N Karim, CNM  Prenatal Vit-Fe Fumarate-FA (PRENATAL MULTIVITAMIN) TABS tablet Take 1 tablet by mouth daily.     Historical Provider, MD    Physical Exam: Vitals:   10/03/16 2345 10/04/16 0000 10/04/16 0030 10/04/16 0100  BP: 149/85 143/86 141/75 149/91  Pulse: (!) 58  (!) 56 60  Resp: 16     Temp:      TempSrc:      SpO2: 100%  98% 96%     General:  Appears calm and comfortable and is NAD; she is accompanied by her 31yo daughter and her newborn daughter and has no other family present Eyes:  PERRL, EOMI, normal lids, iris ENT:  grossly normal hearing, lips & tongue, mmm Neck:  no LAD, masses or thyromegaly Cardiovascular:  RRR, no m/r/g. No LE edema.  Respiratory:  CTA bilaterally, no w/r/r. Normal respiratory effort. Abdomen:  soft, ntnd, NABS Skin:  no rash or induration seen on limited exam Musculoskeletal:  grossly normal tone BUE/BLE, good ROM, no bony abnormality Psychiatric:  grossly normal mood and affect, speech fluent and appropriate, AOx3 Neurologic:  CN 2-12 grossly intact, moves all extremities in coordinated fashion, sensation intact  Labs on Admission: I have personally reviewed following labs and imaging studies  CBC:  Recent Labs Lab 09/28/16 0821 10/02/16 0155 10/03/16 1725  WBC 23.1* 9.0 11.3*  HGB 9.3* 8.1* 9.3*  HCT 27.2* 25.2* 28.8*  MCV 85.8 89.4 87.5  PLT 246 271 396   Basic Metabolic Panel:  Recent Labs Lab 10/02/16 0155 10/03/16 1725  NA 138 140  K 4.2 4.0  CL 106 111  CO2 27 24  GLUCOSE 100* 92  BUN 8 7  CREATININE 0.56 0.60  CALCIUM 9.3 9.6   GFR: Estimated Creatinine Clearance: 103.1 mL/min (by C-G formula based on SCr of 0.6 mg/dL). Liver Function Tests:  Recent Labs Lab  10/02/16 0155  AST 29  ALT 41  ALKPHOS 108  BILITOT 0.2*  PROT 5.8*  ALBUMIN 2.5*   No results for input(s): LIPASE, AMYLASE in the last 168 hours. No results for input(s): AMMONIA in the last 168 hours. Coagulation Profile: No results for input(s): INR, PROTIME in the last 168 hours. Cardiac Enzymes:  Recent Labs Lab 10/03/16 1725  TROPONINI <0.03   BNP (last 3 results) No results for input(s): PROBNP in the last 8760 hours. HbA1C: No results for input(s): HGBA1C in the last 72 hours. CBG: No  results for input(s): GLUCAP in the last 168 hours. Lipid Profile: No results for input(s): CHOL, HDL, LDLCALC, TRIG, CHOLHDL, LDLDIRECT in the last 72 hours. Thyroid Function Tests: No results for input(s): TSH, T4TOTAL, FREET4, T3FREE, THYROIDAB in the last 72 hours. Anemia Panel: No results for input(s): VITAMINB12, FOLATE, FERRITIN, TIBC, IRON, RETICCTPCT in the last 72 hours. Urine analysis:    Component Value Date/Time   COLORURINE YELLOW 10/03/2016 2134   APPEARANCEUR CLEAR 10/03/2016 2134   LABSPEC 1.013 10/03/2016 2134   PHURINE 8.0 10/03/2016 2134   GLUCOSEU NEGATIVE 10/03/2016 2134   HGBUR SMALL (A) 10/03/2016 2134   BILIRUBINUR NEGATIVE 10/03/2016 2134   KETONESUR NEGATIVE 10/03/2016 2134   PROTEINUR NEGATIVE 10/03/2016 2134   UROBILINOGEN 0.2 07/24/2016 1503   NITRITE NEGATIVE 10/03/2016 2134   LEUKOCYTESUR NEGATIVE 10/03/2016 2134    Creatinine Clearance: Estimated Creatinine Clearance: 103.1 mL/min (by C-G formula based on SCr of 0.6 mg/dL).  Sepsis Labs: @LABRCNTIP (procalcitonin:4,lacticidven:4) )No results found for this or any previous visit (from the past 240 hour(s)).   Radiological Exams on Admission: Dg Chest 2 View  Result Date: 10/03/2016 CLINICAL DATA:  Chest pain, cough and shortness of breath. Generalized body aches. C-section on 09/27/2016. EXAM: CHEST  2 VIEW COMPARISON:  09/11/2014 FINDINGS: New borderline cardiomegaly. Tiny bilateral  pleural effusions. Pulmonary vascularity is normal. No infiltrates. No acute bone abnormality. IMPRESSION: Borderline cardiomegaly, new.  Tiny bilateral pleural effusions. Electronically Signed   By: Francene BoyersJames  Maxwell M.D.   On: 10/03/2016 18:29    EKG: Independently reviewed.  NSR with rate 68; normal EKG with no evidence of acute ischemia  Assessment/Plan Principal Problem:   Dyspnea Active Problems:   S/P cesarean section   Chest pain   Anemia affecting ninth pregnancy   Breast feeding status of mother   Dyspnea with chest pain -Suspect that patient is having routine post-partum/post-C-section myalgias/malaise/dyspnea -She may also have a viral syndrome, given her mildly elevated WBC count of 11.3 -However, she does have a slightly elevated BNP of 255.9 and borderline cardiomegaly and it is possible that she is having symptoms of postpartum cardiomyopathy -Will observe on telemetry overnight and order Echocardiogram for tomorrow -If Echo is normal, patient is appropriate for discharge with routine f/u instructions -She does not appear markedly volume overloaded by exam or CXR and so will not diurese at this time  Anemia -Stable from earlier in pregnancy -She is not taking iron, this may improve her symptoms as well -Hgb 9.3  Breast feeding -Attempt to avoid medications that are not compatible with lactation unless truly needed  DVT prophylaxis: Lovenox  Code Status: Full  Family Communication: None present Disposition Plan:  Home once clinically improved Consults called: None  Admission status: It is my clinical opinion that referral for OBSERVATION is reasonable and necessary in this patient based on the above information provided. The aforementioned taken together are felt to place the patient at high risk for further clinical deterioration. However it is anticipated that the patient may be medically stable for discharge from the hospital within 24 to 48 hours.    Jonah BlueJennifer  Benzion Mesta MD [facility]  If 7PM-7AM, please contact night-coverage [email or web address] Password [email or web address]  10/04/2016, 1:17 AM

## 2016-10-03 NOTE — ED Notes (Signed)
Pediatrics called regarding breast pump order. Will fill out proper paperwork to have breast pump kit to pt bedside.

## 2016-10-03 NOTE — ED Provider Notes (Signed)
[facility]-EMERGENCY DEPT Provider Note   CSN: [id number] Arrival date & time: 10/03/16  1532    History   Chief Complaint Chief Complaint  Patient presents with  . Chest Pain  . Headache    HPI Paul Halfyonna Mier is a 30 y.o. female.  30 y/o [id number] who is S/P c-section on 09/27/16 presents to the ED for c/o chest pain. Patient states that pain is a deep aching pain that is largely nonradiating. It has been intermittent since delivery of her child 6 days ago. She states that the pain is worse when lying flat. She has had associated shortness of breath which is also worse with supination and when exerting herself. Patient reports some intermittent swelling in her BLE and feet which will temporarily resolve with elevation. She also has a 5 pillow orthopnea, but has "always liked sleeping on pillows". She has taken Tylenol and ibuprofen for symptoms without relief. Patient reports a dry, nonproductive cough which is sporadic. She believes that she may have a viral upper respiratory infection. [facility] for similar symptoms. She was told that she was fighting a viral illness. Patient has had no associated fevers or syncope. No vomiting or diarrhea. Patient mildly hypertensive in the ED; no hx of same. Patient is breastfeeding.   The history is provided by the patient. No language interpreter was used.  Chest Pain   Associated symptoms include headaches.  Headache      Past Medical History:  Diagnosis Date  . Acid reflux   . Anemia   . Anxiety   . Depression   . Hypoglycemia   . Infection    UTI    Patient Active Problem List   Diagnosis Date Noted  . Chest pain 10/03/2016  . Post-dates pregnancy 09/27/2016  . S/P cesarean section 09/27/2016  . Alcohol use affecting pregnancy in second trimester, antepartum 04/21/2016  . Supervision of other normal pregnancy, antepartum 04/17/2016  . Depression, major, single episode, severe (HCC) 04/22/2015  .  Suicidal ideations 04/22/2015  . Alcohol abuse 04/22/2015  . Major depressive disorder, recurrent episode, severe with anxious distress (HCC) 04/22/2015  . Suicidal ideation     Past Surgical History:  Procedure Laterality Date  . CESAREAN SECTION N/A 09/27/2016   Procedure: CESAREAN SECTION;  Surgeon: Tereso NewcomerUgonna A Anyanwu, MD;  Location: [facility];  Service: Obstetrics;  Laterality: N/A;  . EYE SURGERY Left    stye removed    OB History    Gravida Para Term Preterm AB Living   9 6 5 1 3 5    SAB TAB Ectopic Multiple Live Births   2 1   0 5       Home Medications    Prior to Admission medications   Medication Sig Start Date End Date Taking? Authorizing Provider  ferrous sulfate 325 (65 FE) MG tablet Take 1 tablet (325 mg total) by mouth 2 (two) times daily with a meal. 09/30/16   Marlis EdelsonWalidah N Karim, CNM  HYDROmorphone (DILAUDID) 2 MG tablet Take 1 tablet (2 mg total) by mouth every 4 (four) hours as needed for severe pain. 09/30/16   Marlis EdelsonWalidah N Karim, CNM  ibuprofen (ADVIL,MOTRIN) 600 MG tablet Take 1 tablet (600 mg total) by mouth every 6 (six) hours as needed for mild pain. 09/30/16   Marlis EdelsonWalidah N Karim, CNM  Prenatal Vit-Fe Fumarate-FA (PRENATAL MULTIVITAMIN) TABS tablet Take 1 tablet by mouth daily.     Historical Provider, MD  Family History Family History  Problem Relation Age of Onset  . Diabetes Maternal Grandmother   . Hearing loss Neg Hx     Social History Social History  Substance Use Topics  . Smoking status: Former Smoker    Types: Cigarettes  . Smokeless tobacco: Never Used  . Alcohol use Yes     Comment: last alcohol was 3 months ago     Allergies   Patient has no known allergies.   Review of Systems Review of Systems  Cardiovascular: Positive for chest pain.  Neurological: Positive for headaches.  Ten systems reviewed and are negative for acute change, except as noted in the HPI.    Physical Exam Updated Vital Signs BP 140/80   Pulse 83    Temp 98 F (36.7 C) (Oral)   Resp 19   SpO2 96%   Physical Exam  Constitutional: She is oriented to person, place, and time. She appears well-developed and well-nourished. No distress.  Nontoxic and in NAD  HENT:  Head: Normocephalic and atraumatic.  Eyes: Conjunctivae and EOM are normal. No scleral icterus.  Neck: Normal range of motion.  No JVD  Cardiovascular: Regular rhythm and intact distal pulses.   Mild bradycardia  Pulmonary/Chest: Effort normal. No respiratory distress. She has no wheezes. She has no rales.  Lungs grossly clear. Chest expansion symmetric.   Abdominal: Soft. She exhibits no distension and no mass. There is no guarding.  C-section incision site is C/D/I. No induration or heat to touch.  Musculoskeletal: Normal range of motion.  No significant peripheral edema.  Neurological: She is alert and oriented to person, place, and time. She exhibits normal muscle tone. Coordination normal.  Skin: Skin is warm and dry. No rash noted. She is not diaphoretic. No erythema. No pallor.  Psychiatric: She has a normal mood and affect. Her behavior is normal.  Nursing note and vitals reviewed.    ED Treatments / Results  Labs (all labs ordered are listed, but only abnormal results are displayed) Labs Reviewed  CBC - Abnormal; Notable for the following:       Result Value   WBC 11.3 (*)    RBC 3.29 (*)    Hemoglobin 9.3 (*)    HCT 28.8 (*)    All other components within normal limits  BRAIN NATRIURETIC PEPTIDE - Abnormal; Notable for the following:    B Natriuretic Peptide 255.9 (*)    All other components within normal limits  BASIC METABOLIC PANEL  TROPONIN I  URINALYSIS, ROUTINE W REFLEX MICROSCOPIC    EKG  EKG Interpretation  Date/Time:  Friday October 03 2016 16:59:00 EST Ventricular Rate:  68 PR Interval:  146 QRS Duration: 82 QT Interval:  342 QTC Calculation: 363 R Axis:   81 Text Interpretation:  Normal sinus rhythm Normal ECG No significant  change since last tracing Confirmed by ISAACS MD, Sheria LangAMERON [phone]([id number]) on 10/03/2016 10:35:36 PM       Radiology Dg Chest 2 View  Result Date: 10/03/2016 CLINICAL DATA:  Chest pain, cough and shortness of breath. Generalized body aches. C-section on 09/27/2016. EXAM: CHEST  2 VIEW COMPARISON:  09/11/2014 FINDINGS: New borderline cardiomegaly. Tiny bilateral pleural effusions. Pulmonary vascularity is normal. No infiltrates. No acute bone abnormality. IMPRESSION: Borderline cardiomegaly, new.  Tiny bilateral pleural effusions. Electronically Signed   By: Francene BoyersJames  Maxwell M.D.   On: 10/03/2016 18:29    Procedures Procedures (including critical care time)  Medications Ordered in ED Medications  ketorolac (TORADOL) 30 MG/ML injection  30 mg (30 mg Intravenous Given 10/03/16 2213)     Initial Impression / Assessment and Plan / ED Course  I have reviewed the triage vital signs and the nursing notes.  Pertinent labs & imaging results that were available during my care of the patient were reviewed by me and considered in my medical decision making (see chart for details).  Clinical Course     30 year old female presents to the emergency department for evaluation of chest pain. Chest pain has been present since C-section delivery 6 days ago. Patient also reporting some shortness of breath as well as a 5 pillow orthopnea. She reports some intermittent lower extremity swelling, though she has no signs of significant fluid overload on exam. No JVD. BNP is mildly elevated and chest x-ray shows a mild cardiomegaly compared to prior imaging. 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 I believe the patient would benefit from an echocardiogram in the morning. [facility] to admit for observation and imaging in the AM. Patient agreeable to plan; VSS.   Final Clinical Impressions(s) / ED Diagnoses   Final diagnoses:  Chest pain, unspecified type    New Prescriptions New Prescriptions   No  medications on file     Antony Madura, PA-C 10/03/16 2235    Shaune Pollack, MD 10/05/16 1016

## 2016-10-03 NOTE — ED Triage Notes (Addendum)
Pt reports c-section 12/23 c/o CP, cough, SOB, h/a. Reports generalized body aches. She is breastfeeding. Is taking tylenol and ibuprofen for body aches. CP is her main complaint today and is constant.

## 2016-10-03 NOTE — ED Notes (Signed)
ED Provider at bedside. 

## 2016-10-03 NOTE — ED Notes (Signed)
EDP at bedside  

## 2016-10-04 ENCOUNTER — Observation Stay (HOSPITAL_BASED_OUTPATIENT_CLINIC_OR_DEPARTMENT_OTHER): Payer: Medicaid Other

## 2016-10-04 DIAGNOSIS — R0609 Other forms of dyspnea: Secondary | ICD-10-CM

## 2016-10-04 DIAGNOSIS — B974 Respiratory syncytial virus as the cause of diseases classified elsewhere: Secondary | ICD-10-CM | POA: Diagnosis not present

## 2016-10-04 DIAGNOSIS — R079 Chest pain, unspecified: Principal | ICD-10-CM

## 2016-10-04 DIAGNOSIS — R0600 Dyspnea, unspecified: Secondary | ICD-10-CM | POA: Diagnosis not present

## 2016-10-04 DIAGNOSIS — O99019 Anemia complicating pregnancy, unspecified trimester: Secondary | ICD-10-CM

## 2016-10-04 DIAGNOSIS — O0940 Supervision of pregnancy with grand multiparity, unspecified trimester: Secondary | ICD-10-CM

## 2016-10-04 LAB — RESPIRATORY PANEL BY PCR
Adenovirus: NOT DETECTED
BORDETELLA PERTUSSIS-RVPCR: NOT DETECTED
CHLAMYDOPHILA PNEUMONIAE-RVPPCR: NOT DETECTED
CORONAVIRUS 229E-RVPPCR: NOT DETECTED
CORONAVIRUS HKU1-RVPPCR: NOT DETECTED
Coronavirus NL63: NOT DETECTED
Coronavirus OC43: NOT DETECTED
INFLUENZA B-RVPPCR: NOT DETECTED
Influenza A: NOT DETECTED
METAPNEUMOVIRUS-RVPPCR: NOT DETECTED
MYCOPLASMA PNEUMONIAE-RVPPCR: NOT DETECTED
PARAINFLUENZA VIRUS 2-RVPPCR: NOT DETECTED
Parainfluenza Virus 1: NOT DETECTED
Parainfluenza Virus 3: NOT DETECTED
Parainfluenza Virus 4: NOT DETECTED
RESPIRATORY SYNCYTIAL VIRUS-RVPPCR: DETECTED — AB
Rhinovirus / Enterovirus: NOT DETECTED

## 2016-10-04 LAB — CBC
HEMATOCRIT: 30.2 % — AB (ref 36.0–46.0)
Hemoglobin: 9.9 g/dL — ABNORMAL LOW (ref 12.0–15.0)
MCH: 28.5 pg (ref 26.0–34.0)
MCHC: 32.8 g/dL (ref 30.0–36.0)
MCV: 87 fL (ref 78.0–100.0)
PLATELETS: 367 10*3/uL (ref 150–400)
RBC: 3.47 MIL/uL — ABNORMAL LOW (ref 3.87–5.11)
RDW: 13.8 % (ref 11.5–15.5)
WBC: 11.2 10*3/uL — AB (ref 4.0–10.5)

## 2016-10-04 LAB — INFLUENZA PANEL BY PCR (TYPE A & B)
INFLAPCR: NEGATIVE
INFLBPCR: NEGATIVE

## 2016-10-04 LAB — BASIC METABOLIC PANEL
Anion gap: 8 (ref 5–15)
BUN: 8 mg/dL (ref 6–20)
CALCIUM: 9.5 mg/dL (ref 8.9–10.3)
CO2: 22 mmol/L (ref 22–32)
Chloride: 109 mmol/L (ref 101–111)
Creatinine, Ser: 0.59 mg/dL (ref 0.44–1.00)
GFR calc Af Amer: >60 mL/min (ref >60)
GLUCOSE: 92 mg/dL (ref 65–99)
POTASSIUM: 3.7 mmol/L (ref 3.5–5.1)
SODIUM: 139 mmol/L (ref 135–145)

## 2016-10-04 LAB — ECHOCARDIOGRAM COMPLETE

## 2016-10-04 MED ORDER — SODIUM CHLORIDE 0.9% FLUSH
3.0000 mL | Freq: Two times a day (BID) | INTRAVENOUS | Status: DC
  Administered 2016-10-04 (×2): 3 mL via INTRAVENOUS

## 2016-10-04 MED ORDER — FERROUS SULFATE 325 (65 FE) MG PO TABS
325.0000 mg | ORAL_TABLET | Freq: Two times a day (BID) | ORAL | Status: DC
  Administered 2016-10-04 (×2): 325 mg via ORAL
  Filled 2016-10-04 (×2): qty 1

## 2016-10-04 MED ORDER — ONDANSETRON HCL 4 MG/2ML IJ SOLN: 4.0000 mg | Freq: Four times a day (QID) | INTRAMUSCULAR | Status: DC | PRN

## 2016-10-04 MED ORDER — ACETAMINOPHEN 325 MG PO TABS: 650.0000 mg | ORAL_TABLET | Freq: Four times a day (QID) | ORAL | Status: DC | PRN

## 2016-10-04 MED ORDER — ENOXAPARIN SODIUM 40 MG/0.4ML ~~LOC~~ SOLN
40.0000 mg | SUBCUTANEOUS | Status: DC
  Administered 2016-10-04: 40 mg via SUBCUTANEOUS
  Filled 2016-10-04: qty 0.4

## 2016-10-04 MED ORDER — ACETAMINOPHEN 650 MG RE SUPP: 650.0000 mg | Freq: Four times a day (QID) | RECTAL | Status: DC | PRN

## 2016-10-04 MED ORDER — ALBUTEROL SULFATE HFA 108 (90 BASE) MCG/ACT IN AERS: 1.0000 | INHALATION_SPRAY | Freq: Four times a day (QID) | RESPIRATORY_TRACT | 0 refills | Status: DC | PRN

## 2016-10-04 MED ORDER — SALINE SPRAY 0.65 % NA SOLN
1.0000 | Freq: Once | NASAL | Status: AC
  Administered 2016-10-04: 1 via NASAL
  Filled 2016-10-04: qty 44

## 2016-10-04 MED ORDER — ONDANSETRON HCL 4 MG PO TABS: 4.0000 mg | ORAL_TABLET | Freq: Four times a day (QID) | ORAL | Status: DC | PRN

## 2016-10-04 MED ORDER — IBUPROFEN 600 MG PO TABS
600.0000 mg | ORAL_TABLET | Freq: Four times a day (QID) | ORAL | Status: DC | PRN
  Administered 2016-10-04: 600 mg via ORAL
  Filled 2016-10-04: qty 1

## 2016-10-04 MED ORDER — OXYCODONE-ACETAMINOPHEN 5-325 MG PO TABS: 1.0000 | ORAL_TABLET | ORAL | Status: DC | PRN

## 2016-10-04 NOTE — Progress Notes (Addendum)
CRITICAL VALUE ALERT  Critical value received: Rep Panel  Positive for RSV  Date of notification: 10/04/16  Time of notification:  1545  Critical value read back: yes  Nurse who received alert: Mellody DanceAllison Jamani Eley  MD notified (1st page):  Dr. Edward JollySilva   Time of first page: 1545  MD notified (2nd page):  Time of second page:  Responding MD: Dr. Edward JollySilva  Time MD responded:

## 2016-10-04 NOTE — Progress Notes (Signed)
Discharge instructions reviewed with patient. Patient has no questions at this time. Prescription given to patient. Pt given the number to [facility]. IV d/c.

## 2016-10-04 NOTE — Discharge Summary (Signed)
Physician Discharge Summary  Tonya Yates  ZOX:[id number]  DOB: 10/02/1986  DOA: 10/03/2016 PCP: Default, Provider, MD  Admit date: 10/03/2016 Discharge date: 10/04/2016  Admitted From: Home  Disposition:  Home   Recommendations for Outpatient Follow-up:  1. Follow up with PCP in 1 week 2. Please obtain BMP/CBC in one week  Discharge Condition: Stable  CODE STATUS: FULL Diet recommendation: Heart Healthy / Carb Modified   Brief/Interim Summary: Tonya Yates is a 30 y.o. female with medical history significant of recent LTCS (patient is G9P5) presenting with not feeling well.  Had LTCS on 12/23 and started having right-sided chest pain that same day of the C-section after the anesthesia started wearing off.  She told the doctor, who didn't seem overly concerned.  She noticed that her HR monitor "kept going off" and they kept encouraging her to move around and had her do breathing exercises to try to get the heart rate up - "so they just turned it off" since it kept going off.  Doctor encouraged her to have her HR more looked into as an outpatient.  She does remember having LE edema in the hospital and the nurses told her it was normal.  Since coming home, very SOB with going upstairs and walking around.  Felt like she would pass out going up the stairs.  When going to sleep, she feels like she is hallucinating and her chest feels weird.  Very tired, feels like she never gets enough rest.  Has been propping herself up on 5 pillows to sleep because she feels better when sitting up; however, she has always slept on a lot of pillows.  +Headache - went to [facility] and got a shot of Toradol for this. Admitted to the hospital to evaluated heart function given borderline cardiomegaly and mild elevated BNP. ECHO was done -reasurring    Subjective: Patient seen and examined, patient report feeling better, R chest pain on and off, mainly with movements. SOB improved. Had echo done this morning essentially  normal.   Discharge Diagnoses:  Chest pain - 2/2 post-partum myalgia and viral syndrome   Dyspnea - 2/2 RSV, ECHO no signs of cardiomyopathy, CRX negative  RSV - viral syndrome causing myalgias and SOB  Will give albuterol inhaler for PRN SOB  Use facemask when caring for children  Keep self well hydrated  Keep good hand hygiene   Borderline cardiomegaly - clinically undetermined - ECHO shows mild concentric hypertrophy, EF 60% Patient may need work up as outpatient - no Hx HTN, patient is obese Follow up with PCP   Anemia - iron deficiency  From pregnancy  Advised to continue iron supplements  Check CBC in 3-4 weeks with PCP   Discharge Instructions  Discharge Instructions    Call MD for:  difficulty breathing, headache or visual disturbances    Complete by:  As directed    Call MD for:  extreme fatigue    Complete by:  As directed    Call MD for:  hives    Complete by:  As directed    Call MD for:  persistant dizziness or light-headedness    Complete by:  As directed    Call MD for:  persistant nausea and vomiting    Complete by:  As directed    Call MD for:  redness, tenderness, or signs of infection (pain, swelling, redness, odor or green/yellow discharge around incision site)    Complete by:  As directed    Call MD for:  severe uncontrolled pain    Complete by:  As directed    Call MD for:  temperature >100.4    Complete by:  As directed    Diet - low sodium heart healthy    Complete by:  As directed    Discharge instructions    Complete by:  As directed    Discharge instructions    Complete by:  As directed    Good hand hygiene Were protective mask when around children  Keep self well hydrated Fever is expected can use tylenol or advil   Increase activity slowly    Complete by:  As directed      Allergies as of 10/04/2016   No Known Allergies     Medication List    TAKE these medications   acetaminophen 325 MG tablet Commonly known as:   TYLENOL Take 650 mg by mouth every 6 (six) hours as needed for mild pain.   albuterol 108 (90 Base) MCG/ACT inhaler Commonly known as:  PROVENTIL HFA;VENTOLIN HFA Inhale 1-2 puffs into the lungs every 6 (six) hours as needed for wheezing or shortness of breath.   ferrous sulfate 325 (65 FE) MG tablet Take 1 tablet (325 mg total) by mouth 2 (two) times daily with a meal.   ibuprofen 600 MG tablet Commonly known as:  ADVIL,MOTRIN Take 1 tablet (600 mg total) by mouth every 6 (six) hours as needed for mild pain.       No Known Allergies  Consultations:  None    Procedures/Studies: Dg Chest 2 View  Result Date: 10/03/2016 CLINICAL DATA:  Chest pain, cough and shortness of breath. Generalized body aches. C-section on 09/27/2016. EXAM: CHEST  2 VIEW COMPARISON:  09/11/2014 FINDINGS: New borderline cardiomegaly. Tiny bilateral pleural effusions. Pulmonary vascularity is normal. No infiltrates. No acute bone abnormality. IMPRESSION: Borderline cardiomegaly, new.  Tiny bilateral pleural effusions. Electronically Signed   By: Francene Boyers M.D.   On: 10/03/2016 18:29   US Ob Limited  Result Date: 10/01/2016 AFI 8.3     Discharge Exam: Vitals:   10/04/16 0823 10/04/16 1407  BP: 131/77 139/69  Pulse:  63  Resp: 18 18  Temp: 98 F (36.7 C) 99.1 F (37.3 C)   Vitals:   10/04/16 0600 10/04/16 0700 10/04/16 0823 10/04/16 1407  BP: 114/63 125/84 131/77 139/69  Pulse: (!) 50 (!) 59  63  Resp: 19 20 18 18   Temp:   98 F (36.7 C) 99.1 F (37.3 C)  TempSrc:   Oral Oral  SpO2: 94% 95% 96% 96%    General: Pt is alert, awake, not in acute distress Cardiovascular: RRR, S1/S2 +, no rubs, no gallops Respiratory: CTA bilaterally, no wheezing, no rhonchi Abdominal: Soft, NT, ND, bowel sounds + Extremities: no edema, no cyanosis  The results of significant diagnostics from this hospitalization (including imaging, microbiology, ancillary and laboratory) are listed below for  reference.     Microbiology: Recent Results (from the past 240 hour(s))  Respiratory Panel by PCR     Status: Abnormal   Collection Time: 10/04/16 12:51 PM  Result Value Ref Range Status   Adenovirus NOT DETECTED NOT DETECTED Final   Coronavirus 229E NOT DETECTED NOT DETECTED Final   Coronavirus HKU1 NOT DETECTED NOT DETECTED Final   Coronavirus NL63 NOT DETECTED NOT DETECTED Final   Coronavirus OC43 NOT DETECTED NOT DETECTED Final   Metapneumovirus NOT DETECTED NOT DETECTED Final   Rhinovirus / Enterovirus NOT DETECTED NOT DETECTED Final   Influenza  A NOT DETECTED NOT DETECTED Final   Influenza B NOT DETECTED NOT DETECTED Final   Parainfluenza Virus 1 NOT DETECTED NOT DETECTED Final   Parainfluenza Virus 2 NOT DETECTED NOT DETECTED Final   Parainfluenza Virus 3 NOT DETECTED NOT DETECTED Final   Parainfluenza Virus 4 NOT DETECTED NOT DETECTED Final   Respiratory Syncytial Virus DETECTED (A) NOT DETECTED Final    Comment: CRITICAL RESULT CALLED TO, READ BACK BY AND VERIFIED WITH: A DUNDUN,RN AT 1543 10/04/16 BY L BENFIELD    Bordetella pertussis NOT DETECTED NOT DETECTED Final   Chlamydophila pneumoniae NOT DETECTED NOT DETECTED Final   Mycoplasma pneumoniae NOT DETECTED NOT DETECTED Final     Labs: BNP (last 3 results)  Recent Labs  10/03/16 1725  BNP 255.9*   Basic Metabolic Panel:  Recent Labs Lab 10/02/16 0155 10/03/16 1725 10/04/16 0205  NA 138 140 139  K 4.2 4.0 3.7  CL 106 111 109  CO2 27 24 22   GLUCOSE 100* 92 92  BUN 8 7 8   CREATININE 0.56 0.60 0.59  CALCIUM 9.3 9.6 9.5   Liver Function Tests:  Recent Labs Lab 10/02/16 0155  AST 29  ALT 41  ALKPHOS 108  BILITOT 0.2*  PROT 5.8*  ALBUMIN 2.5*   No results for input(s): LIPASE, AMYLASE in the last 168 hours. No results for input(s): AMMONIA in the last 168 hours. CBC:  Recent Labs Lab 09/28/16 0821 10/02/16 0155 10/03/16 1725 10/04/16 0205  WBC 23.1* 9.0 11.3* 11.2*  HGB 9.3* 8.1* 9.3*  9.9*  HCT 27.2* 25.2* 28.8* 30.2*  MCV 85.8 89.4 87.5 87.0  PLT 246 271 396 367   Cardiac Enzymes:  Recent Labs Lab 10/03/16 1725  TROPONINI <0.03   BNP: Invalid input(s): POCBNP CBG: No results for input(s): GLUCAP in the last 168 hours. D-Dimer No results for input(s): DDIMER in the last 72 hours. Hgb A1c No results for input(s): HGBA1C in the last 72 hours. Lipid Profile No results for input(s): CHOL, HDL, LDLCALC, TRIG, CHOLHDL, LDLDIRECT in the last 72 hours. Thyroid function studies No results for input(s): TSH, T4TOTAL, T3FREE, THYROIDAB in the last 72 hours.  Invalid input(s): FREET3 Anemia work up No results for input(s): VITAMINB12, FOLATE, FERRITIN, TIBC, IRON, RETICCTPCT in the last 72 hours. Urinalysis    Component Value Date/Time   COLORURINE YELLOW 10/03/2016 2134   APPEARANCEUR CLEAR 10/03/2016 2134   LABSPEC 1.013 10/03/2016 2134   PHURINE 8.0 10/03/2016 2134   GLUCOSEU NEGATIVE 10/03/2016 2134   HGBUR SMALL (A) 10/03/2016 2134   BILIRUBINUR NEGATIVE 10/03/2016 2134   KETONESUR NEGATIVE 10/03/2016 2134   PROTEINUR NEGATIVE 10/03/2016 2134   UROBILINOGEN 0.2 07/24/2016 1503   NITRITE NEGATIVE 10/03/2016 2134   LEUKOCYTESUR NEGATIVE 10/03/2016 2134   Sepsis Labs Invalid input(s): PROCALCITONIN,  WBC,  LACTICIDVEN Microbiology Recent Results (from the past 240 hour(s))  Respiratory Panel by PCR     Status: Abnormal   Collection Time: 10/04/16 12:51 PM  Result Value Ref Range Status   Adenovirus NOT DETECTED NOT DETECTED Final   Coronavirus 229E NOT DETECTED NOT DETECTED Final   Coronavirus HKU1 NOT DETECTED NOT DETECTED Final   Coronavirus NL63 NOT DETECTED NOT DETECTED Final   Coronavirus OC43 NOT DETECTED NOT DETECTED Final   Metapneumovirus NOT DETECTED NOT DETECTED Final   Rhinovirus / Enterovirus NOT DETECTED NOT DETECTED Final   Influenza A NOT DETECTED NOT DETECTED Final   Influenza B NOT DETECTED NOT DETECTED Final   Parainfluenza  Virus  1 NOT DETECTED NOT DETECTED Final   Parainfluenza Virus 2 NOT DETECTED NOT DETECTED Final   Parainfluenza Virus 3 NOT DETECTED NOT DETECTED Final   Parainfluenza Virus 4 NOT DETECTED NOT DETECTED Final   Respiratory Syncytial Virus DETECTED (A) NOT DETECTED Final    Comment: CRITICAL RESULT CALLED TO, READ BACK BY AND VERIFIED WITH: A DUNDUN,RN AT 1543 10/04/16 BY L BENFIELD    Bordetella pertussis NOT DETECTED NOT DETECTED Final   Chlamydophila pneumoniae NOT DETECTED NOT DETECTED Final   Mycoplasma pneumoniae NOT DETECTED NOT DETECTED Final    SIGNED:  Latrelle DodrillEdwin Silva, MD  [facility] 10/04/2016, 4:24 PM Pager   If 7PM-7AM, please contact night-coverage [email or web address] Password [email or web address]

## 2016-10-04 NOTE — ED Notes (Signed)
Breast pump at the bedside. Pt states she wants to wait until her baby is asleep before pumping.

## 2016-10-04 NOTE — ED Notes (Signed)
Pt aware waiting for transport to transport her to her bed assignment. Pt alert, oriented, cooperative. Pt on cell phone.

## 2016-10-04 NOTE — ED Notes (Signed)
Advised 3W secretary pt en route. Pt being transported to 3W20 via stretcher w/telemetry.

## 2016-10-04 NOTE — ED Notes (Signed)
Pt using breast pump at this time.

## 2016-10-09 ENCOUNTER — Encounter: Payer: Self-pay | Admitting: Medical

## 2016-10-22 ENCOUNTER — Encounter (HOSPITAL_COMMUNITY): Payer: Self-pay

## 2016-10-22 ENCOUNTER — Emergency Department (HOSPITAL_COMMUNITY)
Admission: EM | Admit: 2016-10-22 | Discharge: 2016-10-22 | Disposition: A | Discharge status: Home / Self Care | Payer: Medicaid Other | Attending: Emergency Medicine | Admitting: Emergency Medicine

## 2016-10-22 DIAGNOSIS — Z87891 Personal history of nicotine dependence: Secondary | ICD-10-CM | POA: Diagnosis not present

## 2016-10-22 DIAGNOSIS — Z79899 Other long term (current) drug therapy: Secondary | ICD-10-CM | POA: Insufficient documentation

## 2016-10-22 DIAGNOSIS — N12 Tubulo-interstitial nephritis, not specified as acute or chronic: Secondary | ICD-10-CM | POA: Diagnosis not present

## 2016-10-22 DIAGNOSIS — N390 Urinary tract infection, site not specified: Secondary | ICD-10-CM | POA: Diagnosis present

## 2016-10-22 LAB — URINALYSIS, ROUTINE W REFLEX MICROSCOPIC
BILIRUBIN URINE: NEGATIVE
GLUCOSE, UA: NEGATIVE mg/dL
HGB URINE DIPSTICK: NEGATIVE
Ketones, ur: NEGATIVE mg/dL
Nitrite: NEGATIVE
PH: 9 — AB (ref 5.0–8.0)
Protein, ur: NEGATIVE mg/dL
SPECIFIC GRAVITY, URINE: 1.009 (ref 1.005–1.030)

## 2016-10-22 LAB — CBC WITH DIFFERENTIAL/PLATELET
BASOS PCT: 0 %
Basophils Absolute: 0 10*3/uL (ref 0.0–0.1)
EOS ABS: 0.3 10*3/uL (ref 0.0–0.7)
EOS PCT: 3 %
HCT: 39.2 % (ref 36.0–46.0)
HEMOGLOBIN: 12.6 g/dL (ref 12.0–15.0)
LYMPHS ABS: 1.4 10*3/uL (ref 0.7–4.0)
Lymphocytes Relative: 11 %
MCH: 27.8 pg (ref 26.0–34.0)
MCHC: 32.1 g/dL (ref 30.0–36.0)
MCV: 86.3 fL (ref 78.0–100.0)
MONOS PCT: 4 %
Monocytes Absolute: 0.5 10*3/uL (ref 0.1–1.0)
NEUTROS PCT: 82 %
Neutro Abs: 9.9 10*3/uL — ABNORMAL HIGH (ref 1.7–7.7)
PLATELETS: 317 10*3/uL (ref 150–400)
RBC: 4.54 MIL/uL (ref 3.87–5.11)
RDW: 13.3 % (ref 11.5–15.5)
WBC: 12.1 10*3/uL — ABNORMAL HIGH (ref 4.0–10.5)

## 2016-10-22 LAB — COMPREHENSIVE METABOLIC PANEL
ALK PHOS: 110 U/L (ref 38–126)
ALT: 11 U/L — AB (ref 14–54)
AST: 15 U/L (ref 15–41)
Albumin: 3.8 g/dL (ref 3.5–5.0)
Anion gap: 8 (ref 5–15)
BUN: 10 mg/dL (ref 6–20)
CALCIUM: 10.5 mg/dL — AB (ref 8.9–10.3)
CHLORIDE: 102 mmol/L (ref 101–111)
CO2: 25 mmol/L (ref 22–32)
CREATININE: 0.9 mg/dL (ref 0.44–1.00)
Glucose, Bld: 103 mg/dL — ABNORMAL HIGH (ref 65–99)
Potassium: 4.3 mmol/L (ref 3.5–5.1)
SODIUM: 135 mmol/L (ref 135–145)
Total Bilirubin: 1.2 mg/dL (ref 0.3–1.2)
Total Protein: 7.5 g/dL (ref 6.5–8.1)

## 2016-10-22 MED ORDER — ACETAMINOPHEN 325 MG PO TABS
650.0000 mg | ORAL_TABLET | Freq: Once | ORAL | Status: AC
  Administered 2016-10-22: 650 mg via ORAL
  Filled 2016-10-22: qty 2

## 2016-10-22 MED ORDER — SODIUM CHLORIDE 0.9 % IV BOLUS (SEPSIS)
1000.0000 mL | Freq: Once | INTRAVENOUS | Status: AC
  Administered 2016-10-22: 1000 mL via INTRAVENOUS

## 2016-10-22 MED ORDER — DEXTROSE 5 % IV SOLN
1.0000 g | Freq: Once | INTRAVENOUS | Status: AC
  Administered 2016-10-22: 1 g via INTRAVENOUS
  Filled 2016-10-22: qty 10

## 2016-10-22 MED ORDER — ONDANSETRON 4 MG PO TBDP: 4.0000 mg | ORAL_TABLET | Freq: Three times a day (TID) | ORAL | 0 refills | Status: DC | PRN

## 2016-10-22 MED ORDER — ONDANSETRON HCL 4 MG/2ML IJ SOLN
4.0000 mg | Freq: Once | INTRAMUSCULAR | Status: AC
  Administered 2016-10-22: 4 mg via INTRAVENOUS
  Filled 2016-10-22: qty 2

## 2016-10-22 MED ORDER — CEPHALEXIN 500 MG PO CAPS: 500.0000 mg | ORAL_CAPSULE | Freq: Four times a day (QID) | ORAL | 0 refills | Status: DC

## 2016-10-22 NOTE — ED Notes (Signed)
Tonya HowellsDenise Yates [phone] mother very concerned about pt.

## 2016-10-22 NOTE — Discharge Instructions (Signed)
Stay very well hydrated with plenty of water throughout the day. Please take antibiotic until completion. Tylenol as needed for pain and/or fever control. Follow up with primary care physician in 1 week for recheck of ongoing symptoms. Please return to the emergency department if you are unable to keep medication down (continued vomiting), fever is not controlled with Tylenol, your symptoms are no better in 3 days, new or worsening symptoms develop, any additional concerns.

## 2016-10-22 NOTE — ED Provider Notes (Signed)
[facility]-EMERGENCY DEPT Provider Note   CSN: [id number] Arrival date & time: 10/22/16  1032     History   Chief Complaint Chief Complaint  Patient presents with  . Urinary Tract Infection  . Flank Pain    HPI Tonya Yates is a 31 y.o. female.  The history is provided by the patient and medical records. No language interpreter was used.  Urinary Tract Infection   Associated symptoms include chills, nausea, vomiting and flank pain.  Flank Pain  Pertinent negatives include no chest pain, no abdominal pain, no headaches and no shortness of breath.   Tonya Yates is a 31 y.o. female who presents to the Emergency Department complaining of left flank pain and dysuria since yesterday. Patient states that she had a Foley catheter in place during C-section approximately 3 weeks ago. Upon removal, she had burning with urination, but this resolved in 3-4 days. Yesterday, burning returned and was accompanied by left flank pain. This morning, she developed chills, nausea and had one episode of emesis. She took ibuprofen yesterday afternoon and Tylenol PM last night. No medications today prior to arrival.  Past Medical History:  Diagnosis Date  . Acid reflux   . Anemia   . Anxiety   . Depression   . Hypoglycemia   . Infection    UTI    Patient Active Problem List   Diagnosis Date Noted  . Dyspnea 10/04/2016  . Anemia affecting ninth pregnancy 10/04/2016  . Breast feeding status of mother 10/04/2016  . Chest pain 10/03/2016  . S/P cesarean section 09/27/2016  . Alcohol use affecting pregnancy in second trimester, antepartum 04/21/2016  . Alcohol abuse 04/22/2015  . Major depressive disorder, recurrent episode, severe with anxious distress (HCC) 04/22/2015  . Suicidal ideation     Past Surgical History:  Procedure Laterality Date  . CESAREAN SECTION N/A 09/27/2016   Procedure: CESAREAN SECTION;  Surgeon: Tereso NewcomerUgonna A Anyanwu, MD;  Location: [facility];  Service: Obstetrics;   Laterality: N/A;  . EYE SURGERY Left    stye removed    OB History    Gravida Para Term Preterm AB Living   9 6 5 1 3 5    SAB TAB Ectopic Multiple Live Births   2 1   0 5       Home Medications    Prior to Admission medications   Medication Sig Start Date End Date Taking? Authorizing Provider  acetaminophen (TYLENOL) 325 MG tablet Take 650 mg by mouth every 6 (six) hours as needed for mild pain.   Yes Historical Provider, MD  albuterol (PROVENTIL HFA;VENTOLIN HFA) 108 (90 Base) MCG/ACT inhaler Inhale 1-2 puffs into the lungs every 6 (six) hours as needed for wheezing or shortness of breath. 10/04/16  Yes Lenox PondsEdwin Silva Zapata, MD  ibuprofen (ADVIL,MOTRIN) 600 MG tablet Take 1 tablet (600 mg total) by mouth every 6 (six) hours as needed for mild pain. 09/30/16  Yes Marlis EdelsonWalidah N Karim, CNM  cephALEXin (KEFLEX) 500 MG capsule Take 1 capsule (500 mg total) by mouth 4 (four) times daily. 10/22/16   Chase PicketJaime Pilcher Surina Storts, PA-C  ferrous sulfate 325 (65 FE) MG tablet Take 1 tablet (325 mg total) by mouth 2 (two) times daily with a meal. Patient not taking: Reported on 10/22/2016 09/30/16   Marlis EdelsonWalidah N Karim, CNM  ondansetron (ZOFRAN ODT) 4 MG disintegrating tablet Take 1 tablet (4 mg total) by mouth every 8 (eight) hours as needed for nausea or vomiting. 10/22/16   Chase PicketJaime Pilcher Shatyra Becka,  PA-C    Family History Family History  Problem Relation Age of Onset  . Diabetes Maternal Grandmother   . Congestive Heart Failure Maternal Grandmother   . Hearing loss Neg Hx     Social History Social History  Substance Use Topics  . Smoking status: Former Smoker    Types: Cigarettes  . Smokeless tobacco: Never Used     Comment: quit about 6 months pregnant  . Alcohol use Yes     Comment: rare     Allergies   Patient has no known allergies.   Review of Systems Review of Systems  Constitutional: Positive for chills and fever.  HENT: Negative for congestion.   Eyes: Negative for visual disturbance.    Respiratory: Negative for cough and shortness of breath.   Cardiovascular: Negative for chest pain.  Gastrointestinal: Positive for nausea and vomiting. Negative for abdominal pain, blood in stool and diarrhea.  Genitourinary: Positive for dysuria and flank pain. Negative for vaginal discharge.  Musculoskeletal: Positive for back pain.  Skin: Negative for rash.  Neurological: Negative for headaches.     Physical Exam Updated Vital Signs BP 120/76 (BP Location: Left Arm)   Pulse 98   Temp 98.6 F (37 C) (Oral)   Resp 16   Ht 5\' 1"  (1.549 m)   Wt 77.1 kg   SpO2 99%   BMI 32.12 kg/m   Physical Exam  Constitutional: She is oriented to person, place, and time. She appears well-developed and well-nourished. No distress.  HENT:  Head: Normocephalic and atraumatic.  Cardiovascular: Normal rate, regular rhythm and normal heart sounds.   No murmur heard. Pulmonary/Chest: Effort normal and breath sounds normal. No respiratory distress. She has no wheezes. She has no rales.  Abdominal: Soft. Bowel sounds are normal. She exhibits no distension.  + CVA tenderness bilaterally, L>R.  TTP of left flank.   Musculoskeletal: Normal range of motion.  Neurological: She is alert and oriented to person, place, and time.  Skin: Skin is warm and dry.  Nursing note and vitals reviewed.    ED Treatments / Results  Labs (all labs ordered are listed, but only abnormal results are displayed) Labs Reviewed  COMPREHENSIVE METABOLIC PANEL - Abnormal; Notable for the following:       Result Value   Glucose, Bld 103 (*)    Calcium 10.5 (*)    ALT 11 (*)    All other components within normal limits  CBC WITH DIFFERENTIAL/PLATELET - Abnormal; Notable for the following:    WBC 12.1 (*)    Neutro Abs 9.9 (*)    All other components within normal limits  URINALYSIS, ROUTINE W REFLEX MICROSCOPIC - Abnormal; Notable for the following:    Color, Urine STRAW (*)    pH 9.0 (*)    Leukocytes, UA LARGE  (*)    Bacteria, UA RARE (*)    Squamous Epithelial / LPF 0-5 (*)    All other components within normal limits  URINE CULTURE    EKG  EKG Interpretation None       Radiology No results found.  Procedures Procedures (including critical care time)  Medications Ordered in ED Medications  ondansetron (ZOFRAN) injection 4 mg (4 mg Intravenous Given 10/22/16 1116)  acetaminophen (TYLENOL) tablet 650 mg (650 mg Oral Given 10/22/16 1116)  cefTRIAXone (ROCEPHIN) 1 g in dextrose 5 % 50 mL IVPB (0 g Intravenous Stopped 10/22/16 1404)  sodium chloride 0.9 % bolus 1,000 mL (0 mLs Intravenous Stopped 10/22/16 1406)  acetaminophen (TYLENOL) tablet 650 mg (650 mg Oral Given 10/22/16 1415)     Initial Impression / Assessment and Plan / ED Course  I have reviewed the triage vital signs and the nursing notes.  Pertinent labs & imaging results that were available during my care of the patient were reviewed by me and considered in my medical decision making (see chart for details).  Clinical Course    Tonya Yates is a 31 y.o. female who presents to ED for left flank pain and dysuria. On exam, patient is nontoxic appearing. She does have temperature 100.5 - tylenol given. She has had no episodes of emesis in the ED today and is tolerating fluids well. She does have CVA tenderness bilaterally, most significantly on the left. Urine obtained which shows large leukocytes and too numerous to count white cells. Urine culture sent. CBC with mildly elevated white count of 12.1. Patient feels improved after fluids, tylenol and zofran. Rocephin given in ED. Will treat with Keflex. PCP follow up strongly encouraged. Return precautions discussed with patient. Patient specifically told to return to ER if she is unable to keep medication down or if symptoms worsen despite antibiotics. All questions answered.   Patient seen by and discussed with Dr. Ethelda Chick who agrees with treatment plan.    Final Clinical  Impressions(s) / ED Diagnoses   Final diagnoses:  Pyelonephritis    New Prescriptions New Prescriptions   CEPHALEXIN (KEFLEX) 500 MG CAPSULE    Take 1 capsule (500 mg total) by mouth 4 (four) times daily.   ONDANSETRON (ZOFRAN ODT) 4 MG DISINTEGRATING TABLET    Take 1 tablet (4 mg total) by mouth every 8 (eight) hours as needed for nausea or vomiting.     [facility] Rayola Everhart, PA-C 10/22/16 1443    Doug Sou, MD 10/22/16 1531

## 2016-10-22 NOTE — ED Provider Notes (Signed)
Complains of left flank pain for approximately the past 2 weeks and mild headache. She vomited one time this morning however denies nausea at present. Patient is alert nontoxic appearing and appears in no distress. Suggest Tylenol for pain as patient is currently breast-feeding   Doug SouSam Maritza Goldsborough, MD 10/22/16 1421

## 2016-10-22 NOTE — ED Notes (Signed)
Pt placed in gown, and on monitor

## 2016-10-22 NOTE — ED Triage Notes (Signed)
[facility] side pain.

## 2016-10-24 LAB — URINE CULTURE

## 2016-10-25 ENCOUNTER — Telehealth: Payer: Self-pay

## 2016-10-25 NOTE — Telephone Encounter (Signed)
Post ED Visit - Positive Culture Follow-up  Culture report reviewed by antimicrobial stewardship pharmacist:  []  Enzo BiNathan Batchelder, Pharm.D. []  Celedonio MiyamotoJeremy Frens, Pharm.D., BCPS []  Garvin FilaMike Maccia, Pharm.D. []  Georgina PillionElizabeth Martin, Pharm.D., BCPS []  [place]h Pham, VermontPharm.D., BCPS, AAHIVP []  Estella HuskMichelle Turner, Pharm.D., BCPS, AAHIVP []  Cassie Stewart, [address] []  Sherle Poeob Vincent, [address]Ph[address] Revonda StandardAllison Masters Pharm D Positive urine culture Treated with Cephalexin, organism sensitive to the same and no further patient follow-up is required at this time.  Jerry CarasCullom, [place] Mathes Burnett 10/25/2016, 9:46 AM

## 2016-11-07 ENCOUNTER — Emergency Department (HOSPITAL_COMMUNITY): Payer: Medicaid Other

## 2016-11-07 ENCOUNTER — Emergency Department (HOSPITAL_COMMUNITY)
Admission: EM | Admit: 2016-11-07 | Discharge: 2016-11-07 | Disposition: A | Discharge status: Home / Self Care | Payer: Medicaid Other | Attending: Emergency Medicine | Admitting: Emergency Medicine

## 2016-11-07 ENCOUNTER — Encounter (HOSPITAL_COMMUNITY): Payer: Self-pay | Admitting: *Deleted

## 2016-11-07 DIAGNOSIS — Z87891 Personal history of nicotine dependence: Secondary | ICD-10-CM | POA: Diagnosis not present

## 2016-11-07 DIAGNOSIS — N3000 Acute cystitis without hematuria: Secondary | ICD-10-CM | POA: Diagnosis not present

## 2016-11-07 DIAGNOSIS — Z79899 Other long term (current) drug therapy: Secondary | ICD-10-CM | POA: Diagnosis not present

## 2016-11-07 DIAGNOSIS — N390 Urinary tract infection, site not specified: Secondary | ICD-10-CM | POA: Diagnosis present

## 2016-11-07 LAB — URINALYSIS, ROUTINE W REFLEX MICROSCOPIC
Bilirubin Urine: NEGATIVE
Glucose, UA: NEGATIVE mg/dL
Hgb urine dipstick: NEGATIVE
KETONES UR: NEGATIVE mg/dL
LEUKOCYTES UA: NEGATIVE
NITRITE: NEGATIVE
PROTEIN: NEGATIVE mg/dL
Specific Gravity, Urine: 1.029 (ref 1.005–1.030)
pH: 5 (ref 5.0–8.0)

## 2016-11-07 LAB — CBC WITH DIFFERENTIAL/PLATELET
BASOS ABS: 0 10*3/uL (ref 0.0–0.1)
BASOS PCT: 0 %
Eosinophils Absolute: 1.3 10*3/uL — ABNORMAL HIGH (ref 0.0–0.7)
Eosinophils Relative: 13 %
HEMATOCRIT: 35.9 % — AB (ref 36.0–46.0)
HEMOGLOBIN: 11.4 g/dL — AB (ref 12.0–15.0)
Lymphocytes Relative: 25 %
Lymphs Abs: 2.7 10*3/uL (ref 0.7–4.0)
MCH: 27.3 pg (ref 26.0–34.0)
MCHC: 31.8 g/dL (ref 30.0–36.0)
MCV: 86.1 fL (ref 78.0–100.0)
Monocytes Absolute: 0.4 10*3/uL (ref 0.1–1.0)
Monocytes Relative: 4 %
NEUTROS ABS: 6.2 10*3/uL (ref 1.7–7.7)
NEUTROS PCT: 58 %
Platelets: 380 10*3/uL (ref 150–400)
RBC: 4.17 MIL/uL (ref 3.87–5.11)
RDW: 14.1 % (ref 11.5–15.5)
WBC: 10.7 10*3/uL — ABNORMAL HIGH (ref 4.0–10.5)

## 2016-11-07 LAB — BASIC METABOLIC PANEL
ANION GAP: 8 (ref 5–15)
BUN: 11 mg/dL (ref 6–20)
CALCIUM: 10 mg/dL (ref 8.9–10.3)
CHLORIDE: 107 mmol/L (ref 101–111)
CO2: 24 mmol/L (ref 22–32)
Creatinine, Ser: 0.71 mg/dL (ref 0.44–1.00)
GFR calc non Af Amer: >60 mL/min (ref >60)
Glucose, Bld: 100 mg/dL — ABNORMAL HIGH (ref 65–99)
Potassium: 4 mmol/L (ref 3.5–5.1)
Sodium: 139 mmol/L (ref 135–145)

## 2016-11-07 LAB — D-DIMER, QUANTITATIVE (NOT AT ARMC)

## 2016-11-07 MED ORDER — CEPHALEXIN 500 MG PO CAPS: 500.0000 mg | ORAL_CAPSULE | Freq: Four times a day (QID) | ORAL | 0 refills | Status: AC

## 2016-11-07 MED ORDER — CEPHALEXIN 500 MG PO CAPS: 500.0000 mg | ORAL_CAPSULE | Freq: Four times a day (QID) | ORAL | 0 refills | Status: DC

## 2016-11-07 NOTE — ED Notes (Signed)
Pt understood dc material. NAD noted. Script given at dc  

## 2016-11-07 NOTE — ED Notes (Signed)
Patient transported to Ultrasound 

## 2016-11-07 NOTE — ED Triage Notes (Signed)
Pt reports being seen on 1/17 and diagnosed with UTI, pt left medications at someones house and unable to complete her antibiotic treatment. Still having back pain, headache, denies fever.

## 2016-11-07 NOTE — ED Provider Notes (Signed)
[facility]-EM[facility]ENCY DEPT Provider Note   CSN: [id number] Arrival date & time: 11/07/16  1458     History   Chief Complaint Chief Complaint  Patient presents with  . Back Pain  . Urinary Tract Infection    HPI Tonya Yates is a 31 y.o. female.  HPI 31 year old female who is one month status post a cerebral delivery of her fifth child presenting with bilateral flank pain and dysuria. She states that she was seen 2 weeks ago and told she had a " kidney infection" and was given Keflex. She states that she took antibiotic for 5 days however lost the prescription after that. She had one week without symptoms however 2 days ago she started having bilateral flank pain and yesterday started having dysuria again. She endorses subjective fevers but denies nausea or vomiting. She also stated that she has had left-sided pleuritic chest pain for the past week. Denies exertional chest pain. Does not have chest pain at rest. Denies cough or hemoptysis. Denies lower extremities swelling or pain.  Past Medical History:  Diagnosis Date  . Acid reflux   . Anemia   . Anxiety   . Depression   . Hypoglycemia   . Infection    UTI    Patient Active Problem List   Diagnosis Date Noted  . Dyspnea 10/04/2016  . Anemia affecting ninth pregnancy 10/04/2016  . Breast feeding status of mother 10/04/2016  . Chest pain 10/03/2016  . S/P cesarean section 09/27/2016  . Alcohol use affecting pregnancy in second trimester, antepartum 04/21/2016  . Alcohol abuse 04/22/2015  . Major depressive disorder, recurrent episode, severe with anxious distress (HCC) 04/22/2015  . Suicidal ideation     Past Surgical History:  Procedure Laterality Date  . CESAREAN SECTION N/A 09/27/2016   Procedure: CESAREAN SECTION;  Surgeon: Tereso Newcomer, MD;  Location: [facility];  Service: Obstetrics;  Laterality: N/A;  . EYE SURGERY Left    stye removed    OB History    Gravida Para Term Preterm AB Living   9 6 5 1 3  5    SAB TAB Ectopic Multiple Live Births   2 1   0 5       Home Medications    Prior to Admission medications   Medication Sig Start Date End Date Taking? Authorizing Provider  albuterol (PROVENTIL HFA;VENTOLIN HFA) 108 (90 Base) MCG/ACT inhaler Inhale 1-2 puffs into the lungs every 6 (six) hours as needed for wheezing or shortness of breath. 10/04/16  Yes Lenox Ponds, MD  cephALEXin (KEFLEX) 500 MG capsule Take 1 capsule (500 mg total) by mouth 4 (four) times daily. 11/07/16 11/14/16  Nolan Tuazon [place] Rickiya Picariello, MD  ferrous sulfate 325 (65 FE) MG tablet Take 1 tablet (325 mg total) by mouth 2 (two) times daily with a meal. Patient not taking: Reported on 10/22/2016 09/30/16   Marlis Edelson, CNM  ibuprofen (ADVIL,MOTRIN) 600 MG tablet Take 1 tablet (600 mg total) by mouth every 6 (six) hours as needed for mild pain. Patient not taking: Reported on 11/07/2016 09/30/16   Marlis Edelson, CNM  ondansetron (ZOFRAN ODT) 4 MG disintegrating tablet Take 1 tablet (4 mg total) by mouth every 8 (eight) hours as needed for nausea or vomiting. Patient not taking: Reported on 11/07/2016 10/22/16   Chase Picket Ward, PA-C    Family History Family History  Problem Relation Age of Onset  . Diabetes Maternal Grandmother   . Congestive Heart Failure Maternal Grandmother   .  Hearing loss Neg Hx     Social History Social History  Substance Use Topics  . Smoking status: Former Smoker    Types: Cigarettes  . Smokeless tobacco: Never Used     Comment: quit about 6 months pregnant  . Alcohol use Yes     Comment: rare     Allergies   Patient has no known allergies.   Review of Systems Review of Systems  Constitutional: Negative for chills and fever.  HENT: Negative for ear pain and sore throat.   Eyes: Negative for pain and visual disturbance.  Respiratory: Positive for shortness of breath. Negative for cough.   Cardiovascular: Positive for chest pain. Negative for palpitations.  Gastrointestinal:  Negative for abdominal pain and vomiting.  Genitourinary: Positive for dysuria and flank pain. Negative for hematuria.  Musculoskeletal: Negative for arthralgias and back pain.  Skin: Negative for color change and rash.  Neurological: Negative for seizures and syncope.  All other systems reviewed and are negative.    Physical Exam Updated Vital Signs BP 118/93   Pulse (!) 47   Temp 98.5 F (36.9 C) (Oral)   Resp 13   SpO2 100%   Physical Exam  Constitutional: She is oriented to person, place, and time. She appears well-developed and well-nourished. No distress.  HENT:  Head: Normocephalic and atraumatic.  Eyes: Conjunctivae are normal.  Neck: Normal range of motion. Neck supple.  Cardiovascular: Normal rate and regular rhythm.   No murmur heard. Pulmonary/Chest: Effort normal and breath sounds normal. No respiratory distress.  Abdominal: Soft. She exhibits no distension. There is tenderness in the suprapubic area.  Lower abdominal incision CDI from prior CD.  Musculoskeletal: She exhibits no edema.  No noted edema or erythema to the b/l LE  Neurological: She is alert and oriented to person, place, and time.  Skin: Skin is warm and dry. Capillary refill takes less than 2 seconds.  Psychiatric: She has a normal mood and affect.  Nursing note and vitals reviewed.    ED Treatments / Results  Labs (all labs ordered are listed, but only abnormal results are displayed) Labs Reviewed  URINALYSIS, ROUTINE W REFLEX MICROSCOPIC - Abnormal; Notable for the following:       Result Value   APPearance HAZY (*)    All other components within normal limits  CBC WITH DIFFERENTIAL/PLATELET - Abnormal; Notable for the following:    WBC 10.7 (*)    Hemoglobin 11.4 (*)    HCT 35.9 (*)    Eosinophils Absolute 1.3 (*)    All other components within normal limits  BASIC METABOLIC PANEL - Abnormal; Notable for the following:    Glucose, Bld 100 (*)    All other components within normal  limits  D-DIMER, QUANTITATIVE (NOT AT [facility])  POC URINE PREG, ED    EKG  EKG Interpretation None       Radiology Dg Chest 2 View  Result Date: 11/07/2016 CLINICAL DATA:  Back pain and fever. EXAM: CHEST  2 VIEW COMPARISON:  October 03, 2016 FINDINGS: The heart size and mediastinal contours are within normal limits. Both lungs are clear. The visualized skeletal structures are unremarkable. IMPRESSION: No active cardiopulmonary disease. Electronically Signed   By: Gerome Sam III M.D   On: 11/07/2016 18:59   US Renal  Result Date: 11/07/2016 CLINICAL DATA:  Back pain for 2 days. EXAM: RENAL / URINARY TRACT ULTRASOUND COMPLETE COMPARISON:  None. FINDINGS: Right Kidney: Length: 11.0 cm. Normal renal cortical thickness and echogenicity  without focal lesions or hydronephrosis. No renal calculi. Left Kidney: Length: 11.0 cm. Normal renal cortical thickness and echogenicity without focal lesions or hydronephrosis. No renal calculi. Bladder: Normal.  Bilateral ureteral jets are noted. IMPRESSION: Normal renal ultrasound examination. Electronically Signed   By: Rudie MeyerP.  Gallerani M.D.   On: 11/07/2016 19:40    Procedures Procedures (including critical care time)  Medications Ordered in ED Medications - No data to display   Initial Impression / Assessment and Plan / ED Course  I have reviewed the triage vital signs and the nursing notes.  Pertinent labs & imaging results that were available during my care of the patient were reviewed by me and considered in my medical decision making (see chart for details).    31 year old female presenting with dysuria and bilateral flank pain and pleuritic chest pain. Concern for recurrent UTI versus pyelonephritis versus renal abscess. Also concern for PE as she is approximately 4 weeks postpartum and at high risk for DVT. Doubt ACS as she has no risk factors and chest pain does not sound typical for ACS. EKG was sinus bradycardia with no ST or T-wave  changes. Chest x-ray was unremarkable with no signs of pneumonia or pneumothorax. Labs are reassuring and d-dimer is negative. Doubt Pe. CP atypical and likely musculoskeletal.  UA negative for signs UTI however since she has exact symptoms as 2 weeks ago of which she had a urine culture with over 100,000 Escherichia coli that was sensitive to Keflex and she did not complete full course of Keflex, will give 7 days Keflex and have her follow-up with her PCP. Strict return precautions given and pt discharged in good condition.  Patient care discussed and supervised by my attending, Dr. Erma HeritageIsaacs. Azalia BilisNathan Brigit Doke, MD   Final Clinical Impressions(s) / ED Diagnoses   Final diagnoses:  Acute cystitis without hematuria    New Prescriptions Discharge Medication List as of 11/07/2016  9:07 PM       Verlia Kaney [place] Jessabelle Markiewicz, MD 11/07/16 [id number]    Shaune Pollackameron Isaacs, MD 11/08/16 1322

## 2016-11-12 ENCOUNTER — Ambulatory Visit: Payer: Self-pay | Admitting: Advanced Practice Midwife

## 2016-12-29 ENCOUNTER — Encounter (HOSPITAL_COMMUNITY): Payer: Self-pay | Admitting: *Deleted

## 2016-12-29 ENCOUNTER — Inpatient Hospital Stay (HOSPITAL_COMMUNITY)
Admission: AD | Admit: 2016-12-29 | Discharge: 2016-12-29 | Disposition: A | Discharge status: Home / Self Care | Payer: Medicaid Other | Source: Ambulatory Visit | Attending: Obstetrics and Gynecology | Admitting: Obstetrics and Gynecology

## 2016-12-29 DIAGNOSIS — N760 Acute vaginitis: Secondary | ICD-10-CM | POA: Insufficient documentation

## 2016-12-29 DIAGNOSIS — Z79899 Other long term (current) drug therapy: Secondary | ICD-10-CM | POA: Insufficient documentation

## 2016-12-29 DIAGNOSIS — B9689 Other specified bacterial agents as the cause of diseases classified elsewhere: Secondary | ICD-10-CM

## 2016-12-29 DIAGNOSIS — Z87891 Personal history of nicotine dependence: Secondary | ICD-10-CM | POA: Insufficient documentation

## 2016-12-29 DIAGNOSIS — R109 Unspecified abdominal pain: Secondary | ICD-10-CM | POA: Insufficient documentation

## 2016-12-29 LAB — WET PREP, GENITAL
SPERM: NONE SEEN
TRICH WET PREP: NONE SEEN
YEAST WET PREP: NONE SEEN

## 2016-12-29 LAB — URINALYSIS, ROUTINE W REFLEX MICROSCOPIC
BILIRUBIN URINE: NEGATIVE
Glucose, UA: NEGATIVE mg/dL
Hgb urine dipstick: NEGATIVE
KETONES UR: NEGATIVE mg/dL
LEUKOCYTES UA: NEGATIVE
NITRITE: NEGATIVE
PH: 6 (ref 5.0–8.0)
PROTEIN: NEGATIVE mg/dL
Specific Gravity, Urine: 1.025 (ref 1.005–1.030)

## 2016-12-29 LAB — POCT PREGNANCY, URINE: PREG TEST UR: NEGATIVE

## 2016-12-29 MED ORDER — METRONIDAZOLE 500 MG PO TABS: 500.0000 mg | ORAL_TABLET | Freq: Two times a day (BID) | ORAL | 0 refills | Status: DC

## 2016-12-29 MED ORDER — METRONIDAZOLE 500 MG PO TABS: 500.0000 mg | ORAL_TABLET | Freq: Two times a day (BID) | ORAL | 0 refills | Status: AC

## 2016-12-29 NOTE — MAU Note (Signed)
Pt called, not in lobby 

## 2016-12-29 NOTE — MAU Note (Signed)
Pt C/O lower abd pain for the last week, has yellow thick discharge x 4 days, has odor.  Denies bleeding or dysuria.

## 2016-12-29 NOTE — MAU Provider Note (Signed)
History     CSN: [id number]  Arrival date and time: 12/29/16 1609   First Provider Initiated Contact with Patient 12/29/16 1821      Chief Complaint  Patient presents with  . Abdominal Pain  . Vaginal Discharge   Tonya Yates is a 31 y.o. [id number] at 3 months post primary cesarean section presenting with concern for STI. States she had unprotected sex with FOB and is concerned that he has other partners. She has a thick yellow abnormal vaginal discharge present for about 4 days. Denies vaginal irritation or itching. Has had mild intermittent abdominal cramping pain since delivery. She was seen in [facility] about a month ago for UTI treatment. LMP 12/18/2016. No contraception but would like to begin. She declines HIV testing stating she's been negative about 5 times.     OB History  Gravida Para Term Preterm AB Living  9 6 5 1 3 5   SAB TAB Ectopic Multiple Live Births  2 1   0 5    # Outcome Date GA Lbr Len/2nd Weight Sex Delivery Anes PTL Lv  9 Term 09/27/16 [redacted]w[redacted]d  3.62 kg (7 lb 15.7 oz) F CS-LTranv EPI  LIV     Birth Comments: wnl  8 Term 05/20/09    F Vag-Spont EPI  LIV  7 Term 09/04/03    M Vag-Spont EPI  LIV  6 Term 03/08/01    F Vag-Spont EPI  LIV  5 Term 04/26/00    M Vag-Spont EPI  LIV  4 Preterm  [redacted]w[redacted]d       FD  3 TAB           2 SAB           1 SAB                 Past Medical History:  Diagnosis Date  . Acid reflux   . Anemia   . Anxiety   . Depression   . Hypoglycemia   . Infection    UTI    Past Surgical History:  Procedure Laterality Date  . CESAREAN SECTION N/A 09/27/2016   Procedure: CESAREAN SECTION;  Surgeon: Tereso Newcomer, MD;  Location: [facility];  Service: Obstetrics;  Laterality: N/A;  . EYE SURGERY Left    stye removed    Family History  Problem Relation Age of Onset  . Diabetes Maternal Grandmother   . Congestive Heart Failure Maternal Grandmother   . Hearing loss Neg Hx     Social History  Substance Use  Topics  . Smoking status: Former Smoker    Types: Cigarettes  . Smokeless tobacco: Never Used     Comment: quit about 6 months pregnant  . Alcohol use Yes     Comment: rare    Allergies: No Known Allergies  Prescriptions Prior to Admission  Medication Sig Dispense Refill Last Dose  . albuterol (PROVENTIL HFA;VENTOLIN HFA) 108 (90 Base) MCG/ACT inhaler Inhale 1-2 puffs into the lungs every 6 (six) hours as needed for wheezing or shortness of breath. 1 Inhaler 0 unknown at unknown  . ferrous sulfate 325 (65 FE) MG tablet Take 1 tablet (325 mg total) by mouth 2 (two) times daily with a meal. (Patient not taking: Reported on 10/22/2016) 30 tablet 3 Not Taking at Unknown time  . ibuprofen (ADVIL,MOTRIN) 600 MG tablet Take 1 tablet (600 mg total) by mouth every 6 (six) hours as needed for mild pain. (Patient not taking: Reported on 11/07/2016)  30 tablet 0 Not Taking at Unknown time  . ondansetron (ZOFRAN ODT) 4 MG disintegrating tablet Take 1 tablet (4 mg total) by mouth every 8 (eight) hours as needed for nausea or vomiting. (Patient not taking: Reported on 11/07/2016) 10 tablet 0 Not Taking at Unknown time    Review of Systems  Constitutional: Negative for chills and fever.  HENT: Negative for congestion.   Genitourinary: Positive for pelvic pain and vaginal discharge. Negative for dysuria, flank pain, frequency, hematuria, vaginal bleeding and vaginal pain.   Physical Exam   Blood pressure 120/66, pulse 61, temperature 98.4 F (36.9 C), temperature source Oral, resp. rate 18, height 5\' 1"  (1.549 m), weight 76.7 kg (169 lb), last menstrual period 12/18/2016, unknown if currently breastfeeding.  Physical Exam  Nursing note and vitals reviewed. Constitutional: She is oriented to person, place, and time. She appears well-developed and well-nourished.  Eyes: Pupils are equal, round, and reactive to light.  Neck: Normal range of motion.  Cardiovascular: Normal rate.   Respiratory: Effort normal.   GI: She exhibits no distension. There is no tenderness. There is no guarding.  Pfannenstiel incision well-healed  Genitourinary: Uterus normal. Vaginal discharge found.  Genitourinary Comments:  Normal external genitalia  Spec exam: Vagina pink, moderate amount homogenous white discharge Cervix posterior, clean, multiparous Bimanual: Cervix mobile, nontender Uterus NSSP, nontender Adnexa nontender, no masses .  Musculoskeletal: Normal range of motion.  Neurological: She is alert and oriented to person, place, and time.  Skin: Skin is warm and dry.    MAU Course  Procedures Results for orders placed or performed during the hospital encounter of 12/29/16 (from the past 24 hour(s))  Urinalysis, Routine w reflex microscopic ([facility])     Status: None   Collection Time: 12/29/16  4:45 PM  Result Value Ref Range   Color, Urine YELLOW YELLOW   APPearance CLEAR CLEAR   Specific Gravity, Urine 1.025 1.005 - 1.030   pH 6.0 5.0 - 8.0   Glucose, UA NEGATIVE NEGATIVE mg/dL   Hgb urine dipstick NEGATIVE NEGATIVE   Bilirubin Urine NEGATIVE NEGATIVE   Ketones, ur NEGATIVE NEGATIVE mg/dL   Protein, ur NEGATIVE NEGATIVE mg/dL   Nitrite NEGATIVE NEGATIVE   Leukocytes, UA NEGATIVE NEGATIVE  Pregnancy, urine POC     Status: None   Collection Time: 12/29/16  4:56 PM  Result Value Ref Range   Preg Test, Ur NEGATIVE NEGATIVE  Wet prep, genital     Status: Abnormal   Collection Time: 12/29/16  6:55 PM  Result Value Ref Range   Yeast Wet Prep HPF POC NONE SEEN NONE SEEN   Trich, Wet Prep NONE SEEN NONE SEEN   Clue Cells Wet Prep HPF POC PRESENT (A) NONE SEEN   WBC, Wet Prep HPF POC MODERATE (A) NONE SEEN   Sperm NONE SEEN      Discussed contraceptive options. Desires Depo-Provera [facility]   Assessment and Plan   1. BV (bacterial vaginosis)    Allergies as of 12/29/2016   No Known Allergies     Medication List    STOP taking these medications   ferrous sulfate  325 (65 FE) MG tablet   ibuprofen 600 MG tablet Commonly known as:  ADVIL,MOTRIN   ondansetron 4 MG disintegrating tablet Commonly known as:  ZOFRAN ODT     TAKE these medications   albuterol 108 (90 Base) MCG/ACT inhaler Commonly known as:  PROVENTIL HFA;VENTOLIN HFA Inhale 1-2 puffs into the lungs  every 6 (six) hours as needed for wheezing or shortness of breath.   metroNIDAZOLE 500 MG tablet Commonly known as:  FLAGYL Take 1 tablet (500 mg total) by mouth 2 (two) times daily.      Follow-up Information    [facility]. Schedule an appointment as soon as possible for a visit in 1 week(s).   Specialty:  Obstetrics and Gynecology Why:  For contraception Contact information: [address] [place] [address] [phone]         Danae Orleans, CNM 12/29/2016 7:53 PM

## 2016-12-29 NOTE — Discharge Instructions (Signed)
Bacterial Vaginosis Bacterial vaginosis is a vaginal infection that occurs when the normal balance of bacteria in the vagina is disrupted. It results from an overgrowth of certain bacteria. This is the most common vaginal infection among women ages 15-44. Because bacterial vaginosis increases your risk for STIs (sexually transmitted infections), getting treated can help reduce your risk for chlamydia, gonorrhea, herpes, and HIV (human immunodeficiency virus). Treatment is also important for preventing complications in pregnant women, because this condition can cause an early (premature) delivery. What are the causes? This condition is caused by an increase in harmful bacteria that are normally present in small amounts in the vagina. However, the reason that the condition develops is not fully understood. What increases the risk? The following factors may make you more likely to develop this condition:  Having a new sexual partner or multiple sexual partners.  Having unprotected sex.  Douching.  Having an intrauterine device (IUD).  Smoking.  Drug and alcohol abuse.  Taking certain antibiotic medicines.  Being pregnant.  You cannot get bacterial vaginosis from toilet seats, bedding, swimming pools, or contact with objects around you. What are the signs or symptoms? Symptoms of this condition include:  Grey or white vaginal discharge. The discharge can also be watery or foamy.  A fish-like odor with discharge, especially after sexual intercourse or during menstruation.  Itching in and around the vagina.  Burning or pain with urination.  Some women with bacterial vaginosis have no signs or symptoms. How is this diagnosed? This condition is diagnosed based on:  Your medical history.  A physical exam of the vagina.  Testing a sample of vaginal fluid under a microscope to look for a large amount of bad bacteria or abnormal cells. Your health care provider may use a cotton swab  or a small wooden spatula to collect the sample.  How is this treated? This condition is treated with antibiotics. These may be given as a pill, a vaginal cream, or a medicine that is put into the vagina (suppository). If the condition comes back after treatment, a second round of antibiotics may be needed. Follow these instructions at home: Medicines  Take over-the-counter and prescription medicines only as told by your health care provider.  Take or use your antibiotic as told by your health care provider. Do not stop taking or using the antibiotic even if you start to feel better. General instructions  If you have a female sexual partner, tell her that you have a vaginal infection. She should see her health care provider and be treated if she has symptoms. If you have a female sexual partner, he does not need treatment.  During treatment: ? Avoid sexual activity until you finish treatment. ? Do not douche. ? Avoid alcohol as directed by your health care provider. ? Avoid breastfeeding as directed by your health care provider.  Drink enough water and fluids to keep your urine clear or pale yellow.  Keep the area around your vagina and rectum clean. ? Wash the area daily with warm water. ? Wipe yourself from front to back after using the toilet.  Keep all follow-up visits as told by your health care provider. This is important. How is this prevented?  Do not douche.  Wash the outside of your vagina with warm water only.  Use protection when having sex. This includes latex condoms and dental dams.  Limit how many sexual partners you have. To help prevent bacterial vaginosis, it is best to have sex with just   one partner (monogamous).  Make sure you and your sexual partner are tested for STIs.  Wear cotton or cotton-lined underwear.  Avoid wearing tight pants and pantyhose, especially during summer.  Limit the amount of alcohol that you drink.  Do not use any products that  contain nicotine or tobacco, such as cigarettes and e-cigarettes. If you need help quitting, ask your health care provider.  Do not use illegal drugs. Where to find more information:  Centers for Disease Control and Prevention: www.cdc.gov/std  American Sexual Health Association (ASHA): www.ashastd.org  U.S. Department of Health and Human Services, Office on Women's Health: www.womenshealth.gov/ or https://www.womenshealth.gov/a-z-topics/bacterial-vaginosis Contact a health care provider if:  Your symptoms do not improve, even after treatment.  You have more discharge or pain when urinating.  You have a fever.  You have pain in your abdomen.  You have pain during sex.  You have vaginal bleeding between periods. Summary  Bacterial vaginosis is a vaginal infection that occurs when the normal balance of bacteria in the vagina is disrupted.  Because bacterial vaginosis increases your risk for STIs (sexually transmitted infections), getting treated can help reduce your risk for chlamydia, gonorrhea, herpes, and HIV (human immunodeficiency virus). Treatment is also important for preventing complications in pregnant women, because the condition can cause an early (premature) delivery.  This condition is treated with antibiotic medicines. These may be given as a pill, a vaginal cream, or a medicine that is put into the vagina (suppository). This information is not intended to replace advice given to you by your health care provider. Make sure you discuss any questions you have with your health care provider. Document Released: 09/22/2005 Document Revised: 06/07/2016 Document Reviewed: 06/07/2016 Elsevier Interactive Patient Education  2017 [employer]vier Inc.  

## 2016-12-30 LAB — GC/CHLAMYDIA PROBE AMP (~~LOC~~) NOT AT ARMC
Chlamydia: NEGATIVE
Neisseria Gonorrhea: NEGATIVE

## 2017-05-24 ENCOUNTER — Emergency Department (HOSPITAL_COMMUNITY)
Admission: EM | Admit: 2017-05-24 | Discharge: 2017-05-24 | Discharge status: Against Medical Advice | Payer: Medicaid Other

## 2017-05-24 NOTE — ED Notes (Addendum)
Pt with daughter in pediatrics.

## 2017-07-02 ENCOUNTER — Other Ambulatory Visit: Payer: Self-pay

## 2017-07-02 ENCOUNTER — Encounter (HOSPITAL_COMMUNITY): Payer: Self-pay | Admitting: Emergency Medicine

## 2017-07-02 ENCOUNTER — Emergency Department (HOSPITAL_COMMUNITY): Payer: Medicaid Other

## 2017-07-02 DIAGNOSIS — R0789 Other chest pain: Secondary | ICD-10-CM | POA: Diagnosis present

## 2017-07-02 DIAGNOSIS — Z5321 Procedure and treatment not carried out due to patient leaving prior to being seen by health care provider: Secondary | ICD-10-CM | POA: Insufficient documentation

## 2017-07-02 NOTE — ED Triage Notes (Signed)
Patient c/o intermittent left sided chest pain x9 months with cough and fatigue x4 days. Denies abdominal pain, N/V/D.

## 2017-07-02 NOTE — ED Notes (Signed)
Pt called for triage, no response from lobby 

## 2017-07-02 NOTE — ED Notes (Signed)
Pt called for blood draw with no response.  RN notified. 

## 2017-07-02 NOTE — ED Notes (Signed)
BLOOD DRAW DELAYED PT CURRENTLY IN Xray

## 2017-07-03 ENCOUNTER — Emergency Department (HOSPITAL_COMMUNITY)
Admission: EM | Admit: 2017-07-03 | Discharge: 2017-07-03 | Discharge status: Against Medical Advice | Payer: Medicaid Other | Attending: Emergency Medicine | Admitting: Emergency Medicine

## 2017-07-03 NOTE — ED Notes (Signed)
Pt called for room no response from lobby 

## 2017-07-03 NOTE — ED Notes (Signed)
Patient called x 3 times to go to a room and no answer. 

## 2017-07-13 ENCOUNTER — Encounter (HOSPITAL_COMMUNITY): Payer: Self-pay | Admitting: Emergency Medicine

## 2017-07-13 DIAGNOSIS — R05 Cough: Secondary | ICD-10-CM | POA: Diagnosis present

## 2017-07-13 DIAGNOSIS — Z5321 Procedure and treatment not carried out due to patient leaving prior to being seen by health care provider: Secondary | ICD-10-CM | POA: Diagnosis not present

## 2017-07-13 NOTE — ED Triage Notes (Addendum)
Per EMS, patient from home, c/o non productive cough and body aches x2 weeks. Reports musculoskeletal chest and rib pain with coughing and movement. Seen at [facility] for same with negative flu. Denies N/V/D.   BP 102/78 HR 76 RR 18 O2 98%

## 2017-07-14 ENCOUNTER — Emergency Department (HOSPITAL_COMMUNITY)
Admission: EM | Admit: 2017-07-14 | Discharge: 2017-07-14 | Disposition: A | Discharge status: Home / Self Care | Payer: Medicaid Other | Attending: Emergency Medicine | Admitting: Emergency Medicine

## 2017-07-14 ENCOUNTER — Emergency Department (HOSPITAL_COMMUNITY): Payer: Medicaid Other

## 2017-07-14 NOTE — ED Notes (Signed)
No answer when called for vitals. 

## 2017-07-14 NOTE — ED Notes (Signed)
Call to waiting area no reponse from pt and staff unable to find pt at this time.

## 2017-10-02 IMAGING — DX DG CHEST 2V
2 series · 2 of 2 positions shown · non-contrast
Comparison: 09/11/2014

CLINICAL DATA: Chest pain, cough and shortness of breath.
Generalized body aches. C-section on 09/27/2016.

EXAM:
CHEST  2 VIEW

[chest pa]
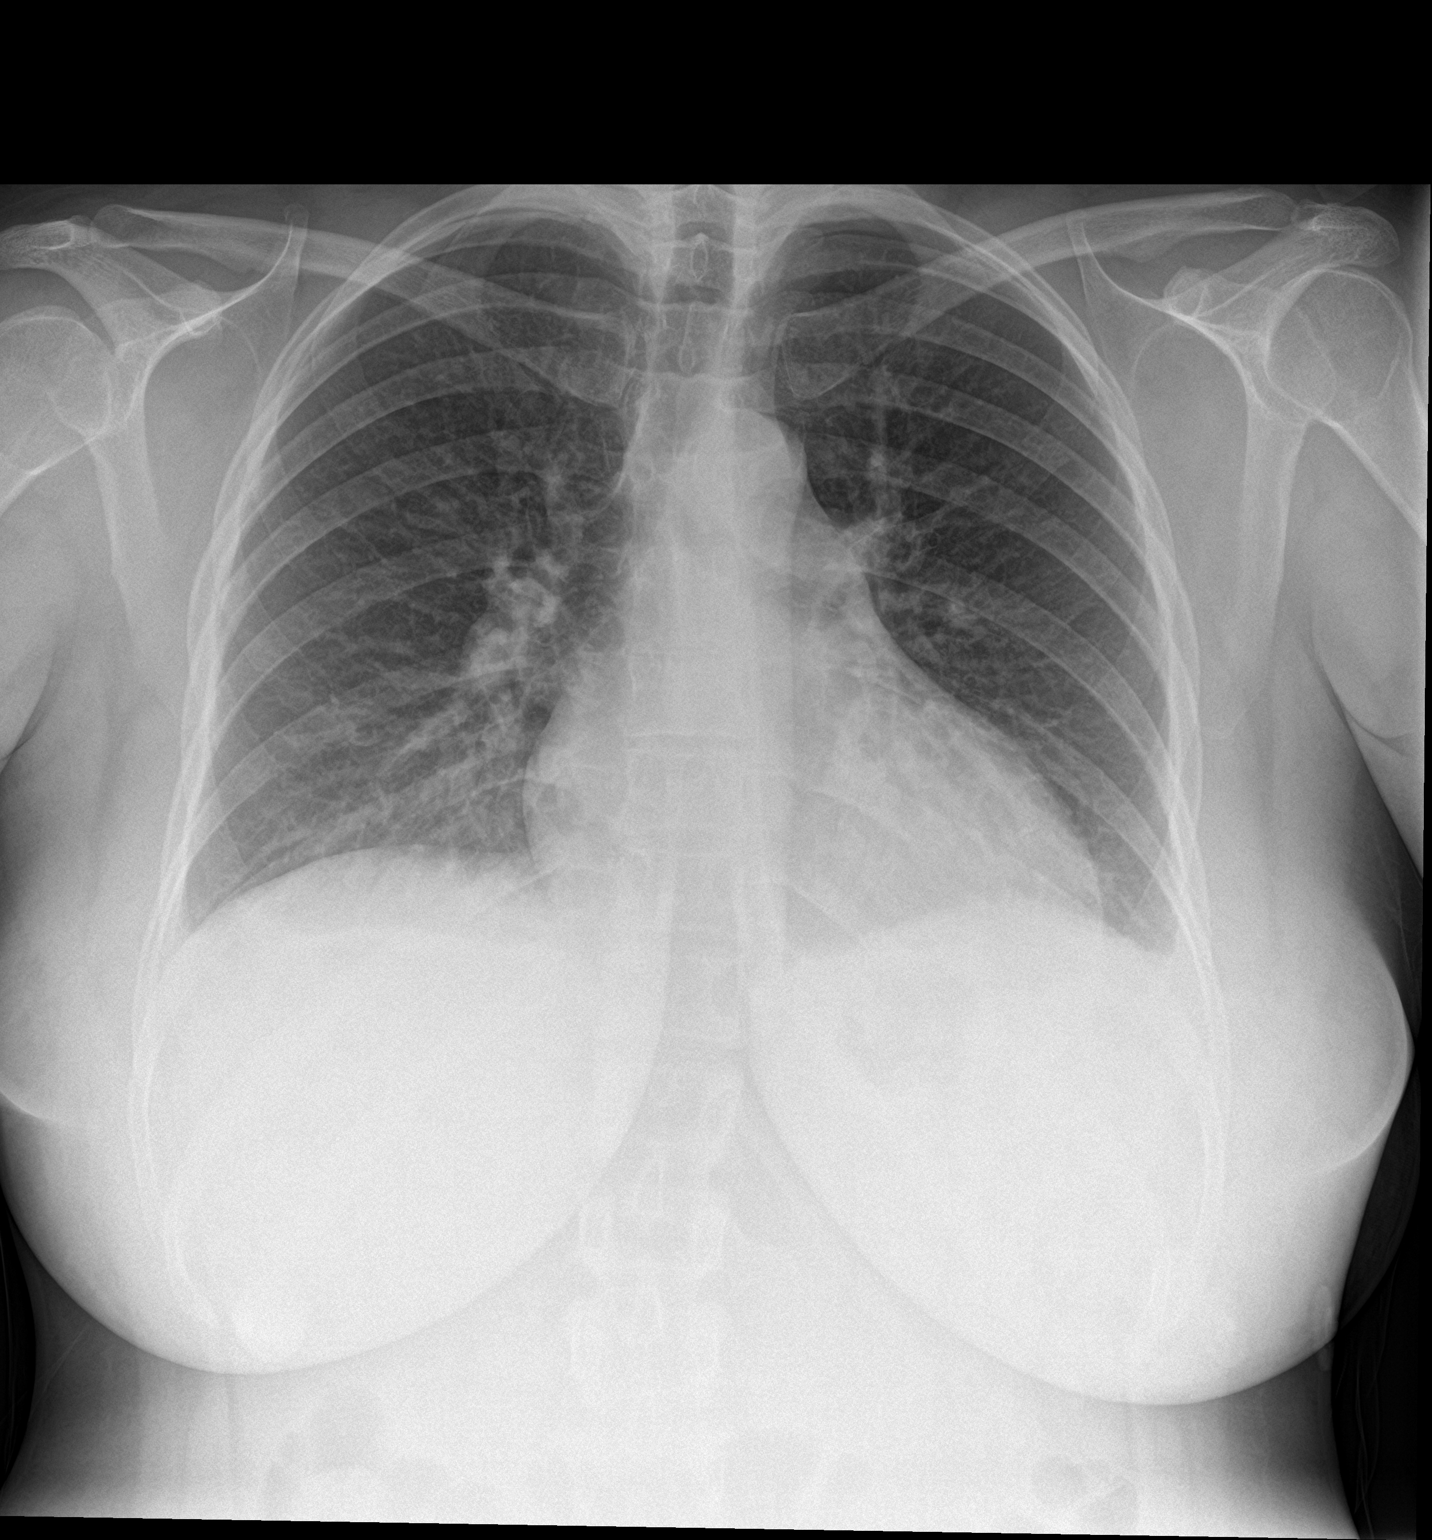

[chest lat]
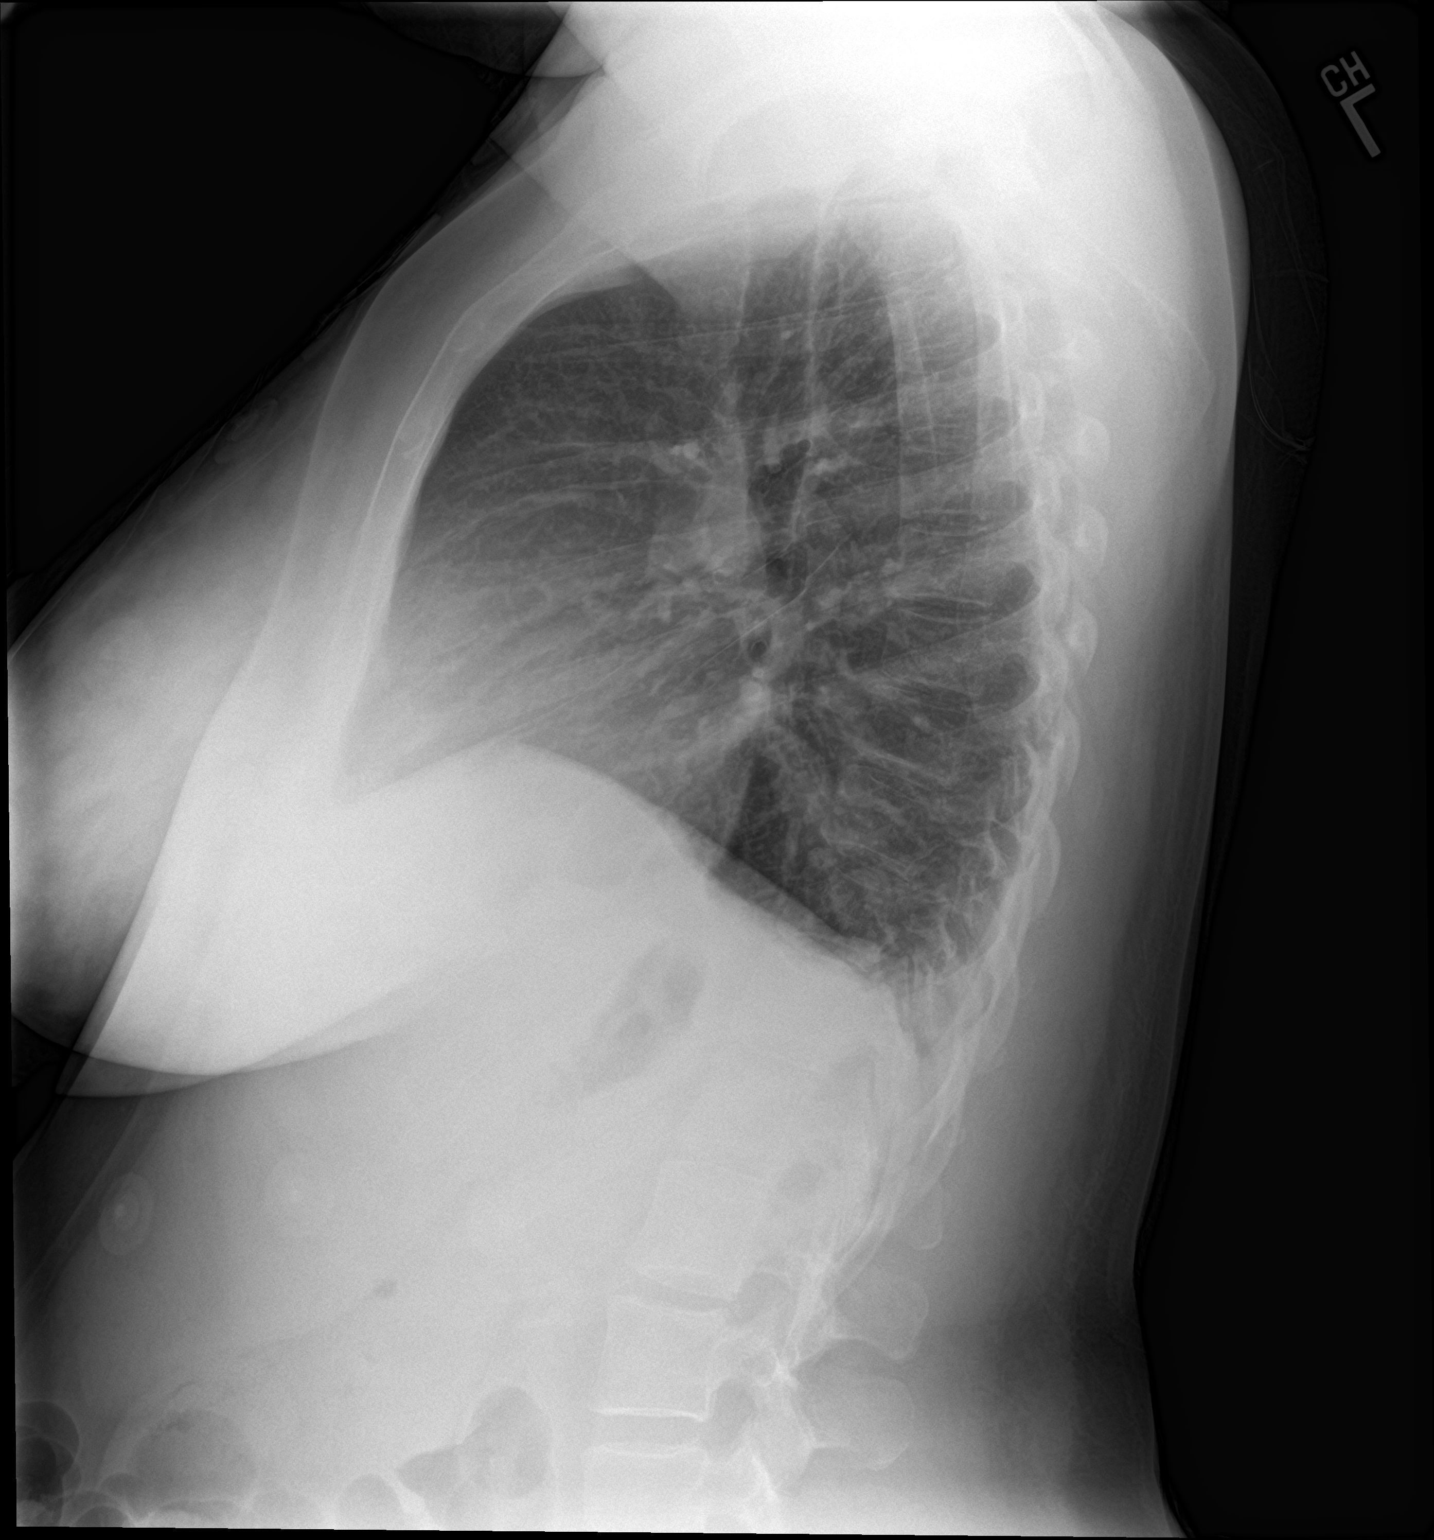

[2 of 2 positions shown; findings below may reference images not displayed]

FINDINGS: New borderline cardiomegaly. Tiny bilateral pleural effusions.
Pulmonary vascularity is normal. No infiltrates. No acute bone
abnormality.
IMPRESSION: Borderline cardiomegaly, new.  Tiny bilateral pleural effusions.

## 2017-11-25 ENCOUNTER — Emergency Department (HOSPITAL_COMMUNITY)
Admission: EM | Admit: 2017-11-25 | Discharge: 2017-11-25 | Disposition: A | Discharge status: Home / Self Care | Payer: Medicaid Other | Attending: Emergency Medicine | Admitting: Emergency Medicine

## 2017-11-25 ENCOUNTER — Encounter (HOSPITAL_COMMUNITY): Payer: Self-pay | Admitting: Emergency Medicine

## 2017-11-25 ENCOUNTER — Other Ambulatory Visit: Payer: Self-pay

## 2017-11-25 ENCOUNTER — Emergency Department (HOSPITAL_COMMUNITY): Payer: Medicaid Other

## 2017-11-25 DIAGNOSIS — R112 Nausea with vomiting, unspecified: Secondary | ICD-10-CM | POA: Insufficient documentation

## 2017-11-25 DIAGNOSIS — R300 Dysuria: Secondary | ICD-10-CM | POA: Insufficient documentation

## 2017-11-25 DIAGNOSIS — R0981 Nasal congestion: Secondary | ICD-10-CM | POA: Insufficient documentation

## 2017-11-25 DIAGNOSIS — R197 Diarrhea, unspecified: Secondary | ICD-10-CM | POA: Insufficient documentation

## 2017-11-25 DIAGNOSIS — R6889 Other general symptoms and signs: Secondary | ICD-10-CM

## 2017-11-25 DIAGNOSIS — R0982 Postnasal drip: Secondary | ICD-10-CM | POA: Insufficient documentation

## 2017-11-25 DIAGNOSIS — R51 Headache: Secondary | ICD-10-CM | POA: Insufficient documentation

## 2017-11-25 DIAGNOSIS — R531 Weakness: Secondary | ICD-10-CM | POA: Insufficient documentation

## 2017-11-25 DIAGNOSIS — J029 Acute pharyngitis, unspecified: Secondary | ICD-10-CM | POA: Insufficient documentation

## 2017-11-25 DIAGNOSIS — R5383 Other fatigue: Secondary | ICD-10-CM | POA: Insufficient documentation

## 2017-11-25 DIAGNOSIS — Z87891 Personal history of nicotine dependence: Secondary | ICD-10-CM | POA: Insufficient documentation

## 2017-11-25 DIAGNOSIS — R05 Cough: Secondary | ICD-10-CM | POA: Insufficient documentation

## 2017-11-25 DIAGNOSIS — R0789 Other chest pain: Secondary | ICD-10-CM | POA: Insufficient documentation

## 2017-11-25 DIAGNOSIS — R0602 Shortness of breath: Secondary | ICD-10-CM | POA: Insufficient documentation

## 2017-11-25 LAB — COMPREHENSIVE METABOLIC PANEL
ALT: 22 U/L (ref 14–54)
ANION GAP: 8 (ref 5–15)
AST: 19 U/L (ref 15–41)
Albumin: 4.1 g/dL (ref 3.5–5.0)
Alkaline Phosphatase: 64 U/L (ref 38–126)
BILIRUBIN TOTAL: 0.7 mg/dL (ref 0.3–1.2)
BUN: 12 mg/dL (ref 6–20)
CHLORIDE: 104 mmol/L (ref 101–111)
CO2: 27 mmol/L (ref 22–32)
Calcium: 9.9 mg/dL (ref 8.9–10.3)
Creatinine, Ser: 0.7 mg/dL (ref 0.44–1.00)
GFR calc Af Amer: >60 mL/min (ref >60)
Glucose, Bld: 71 mg/dL (ref 65–99)
POTASSIUM: 3.7 mmol/L (ref 3.5–5.1)
Sodium: 139 mmol/L (ref 135–145)
Total Protein: 7.3 g/dL (ref 6.5–8.1)

## 2017-11-25 LAB — CBC
HEMATOCRIT: 39.8 % (ref 36.0–46.0)
HEMOGLOBIN: 12.9 g/dL (ref 12.0–15.0)
MCH: 29.2 pg (ref 26.0–34.0)
MCHC: 32.4 g/dL (ref 30.0–36.0)
MCV: 90 fL (ref 78.0–100.0)
Platelets: 304 10*3/uL (ref 150–400)
RBC: 4.42 MIL/uL (ref 3.87–5.11)
RDW: 12.9 % (ref 11.5–15.5)
WBC: 7.6 10*3/uL (ref 4.0–10.5)

## 2017-11-25 LAB — URINALYSIS, ROUTINE W REFLEX MICROSCOPIC
Bilirubin Urine: NEGATIVE
Glucose, UA: NEGATIVE mg/dL
Hgb urine dipstick: NEGATIVE
KETONES UR: NEGATIVE mg/dL
LEUKOCYTES UA: NEGATIVE
NITRITE: NEGATIVE
PH: 6 (ref 5.0–8.0)
Protein, ur: NEGATIVE mg/dL
SPECIFIC GRAVITY, URINE: 1.024 (ref 1.005–1.030)

## 2017-11-25 LAB — I-STAT TROPONIN, ED: TROPONIN I, POC: 0 ng/mL (ref 0.00–0.08)

## 2017-11-25 LAB — LIPASE, BLOOD: LIPASE: 35 U/L (ref 11–51)

## 2017-11-25 LAB — I-STAT BETA HCG BLOOD, ED (MC, WL, AP ONLY): I-stat hCG, quantitative: <5 m[IU]/mL (ref <5)

## 2017-11-25 LAB — POC URINE PREG, ED: Preg Test, Ur: NEGATIVE

## 2017-11-25 MED ORDER — OSELTAMIVIR PHOSPHATE 75 MG PO CAPS: 75.0000 mg | ORAL_CAPSULE | Freq: Two times a day (BID) | ORAL | 0 refills | Status: DC

## 2017-11-25 MED ORDER — FLUTICASONE PROPIONATE 50 MCG/ACT NA SUSP: 2.0000 | Freq: Every day | NASAL | 0 refills | Status: DC

## 2017-11-25 MED ORDER — ACETAMINOPHEN 325 MG PO TABS: 650.0000 mg | ORAL_TABLET | Freq: Four times a day (QID) | ORAL | 0 refills | Status: DC | PRN

## 2017-11-25 MED ORDER — IBUPROFEN 600 MG PO TABS: 600.0000 mg | ORAL_TABLET | Freq: Four times a day (QID) | ORAL | 0 refills | Status: DC | PRN

## 2017-11-25 NOTE — ED Triage Notes (Signed)
Per pt, states she has been having abdominal pain for 2 days with vomiting and diarrhea-states she also wants her heart checked out due to it hurting for 6 months-wants a full cardiac work up due to a family history of MI's-assured patient we can perform EKG-states she is under a lot of stress-encouraged patient to place masks on her 2 small children due to flu regulations-

## 2017-11-25 NOTE — ED Provider Notes (Signed)
[facility]-EMERGENCY DEPT Provider Note   CSN: [id number] Arrival date & time: 11/25/17  1404     History   Chief Complaint Chief Complaint  Patient presents with  . Abdominal Pain  . Chest Pain  . Shoulder Pain    HPI Tonya Yates is a 32 y.o. female.  HPI  Pt is a 32 y/o female who presents to the ED with multiple complaints. States she started having nasal congestion, sinus pressure, rhinorrhea, postnasal drip that began about 2 days ago. Then started to have a sore throat and intermittent right sided headache. Denies vision changes, dizziness, sudden onset HA, eye pain, or worst headache of life. Also reports a cough, diarrhea, and vomiting for the last 2 days. Some abdominal cramping to bilat upper abdomen, but intermittent. Also complains that she hurts all over and has dysuria. Denies fevers, difficulty swallowing, ear pain, or back pain. No pelvic pain and no vaginal discharge, discomfort, bleeding, etc.  States she has had shortness of breath, chest heaviness, fatigue, and generalized weakness for a year since she had her baby. States chest pain is intermittent with episodes of sharp left lateral chest pain when she wakes up that last 60 seconds and resolve spontaneously. States this has been going on for a year. Denies that sxs are associated with nausea, diaphoresis, or exertion.   Denies leg pain/swelling/redness to the calves, hemoptysis, recent surgery/trauma, recent long travel, hormone use, personal hx of cancer, or hx of DVT/PE.    Past Medical History:  Diagnosis Date  . Acid reflux   . Anemia   . Anxiety   . Depression   . Hypoglycemia   . Infection    UTI    Patient Active Problem List   Diagnosis Date Noted  . Dyspnea 10/04/2016  . Anemia affecting ninth pregnancy 10/04/2016  . Breast feeding status of mother 10/04/2016  . Chest pain 10/03/2016  . S/P cesarean section 09/27/2016  . Alcohol use affecting pregnancy in second  trimester, antepartum 04/21/2016  . Alcohol abuse 04/22/2015  . Major depressive disorder, recurrent episode, severe with anxious distress (HCC) 04/22/2015  . Suicidal ideation     Past Surgical History:  Procedure Laterality Date  . CESAREAN SECTION N/A 09/27/2016   Procedure: CESAREAN SECTION;  Surgeon: Tereso Newcomer, MD;  Location: [facility];  Service: Obstetrics;  Laterality: N/A;  . EYE SURGERY Left    stye removed    OB History    Gravida Para Term Preterm AB Living   9 6 5 1 3 5    SAB TAB Ectopic Multiple Live Births   2 1   0 5       Home Medications    Prior to Admission medications   Medication Sig Start Date End Date Taking? Authorizing Provider  albuterol (PROVENTIL HFA;VENTOLIN HFA) 108 (90 Base) MCG/ACT inhaler Inhale 1-2 puffs into the lungs every 6 (six) hours as needed for wheezing or shortness of breath. 10/04/16  Yes Randel Pigg, Dorma Russell, MD  acetaminophen (TYLENOL) 325 MG tablet Take 2 tablets (650 mg total) by mouth every 6 (six) hours as needed. Do not take more than 4000mg  of tylenol per day 11/25/17   Saidy Ormand S, PA-C  fluticasone (FLONASE) 50 MCG/ACT nasal spray Place 2 sprays into both nostrils daily. 11/25/17   Aadhira Heffernan S, PA-C  ibuprofen (ADVIL,MOTRIN) 600 MG tablet Take 1 tablet (600 mg total) by mouth every 6 (six) hours as needed. 11/25/17   Larae Caison S,  PA-C  metroNIDAZOLE (FLAGYL) 500 MG tablet Take 1 tablet (500 mg total) by mouth 2 (two) times daily. Patient not taking: Reported on 11/25/2017 12/29/16   Danae OrleansPoe, Deirdre C, CNM  oseltamivir (TAMIFLU) 75 MG capsule Take 1 capsule (75 mg total) by mouth every 12 (twelve) hours. 11/25/17   Jesiah Grismer S, PA-C    Family History Family History  Problem Relation Age of Onset  . Diabetes Maternal Grandmother   . Congestive Heart Failure Maternal Grandmother   . Hearing loss Neg Hx     Social History Social History   Tobacco Use  . Smoking status: Former Smoker     Types: Cigarettes  . Smokeless tobacco: Never Used  . Tobacco comment: quit about 6 months pregnant  Substance Use Topics  . Alcohol use: Yes    Comment: rare  . Drug use: No    Comment: Patient reports smoking marijuana in the post     Allergies   Patient has no known allergies.   Review of Systems Review of Systems  Constitutional: Positive for fatigue. Negative for chills and fever.  HENT: Positive for congestion, postnasal drip, rhinorrhea, sinus pressure, sinus pain and sore throat. Negative for drooling, ear pain, trouble swallowing and voice change.   Eyes: Negative for pain and visual disturbance.  Respiratory: Positive for cough and shortness of breath (chronic unchanged).   Cardiovascular: Positive for chest pain (chronic unchanged). Negative for palpitations.  Gastrointestinal: Positive for diarrhea and vomiting. Negative for abdominal pain and nausea.  Genitourinary: Negative for dysuria and hematuria.  Musculoskeletal: Positive for myalgias. Negative for back pain and neck pain.  Skin: Negative for color change and rash.  Neurological: Positive for weakness and headaches. Negative for dizziness and light-headedness.  All other systems reviewed and are negative.    Physical Exam Updated Vital Signs BP 113/70 (BP Location: Right Arm)   Pulse 62   Temp 98.1 F (36.7 C) (Oral)   Resp 18   Wt 82.6 kg (182 lb 1 oz)   LMP 11/18/2017   SpO2 98%   BMI 34.40 kg/m   Physical Exam  Constitutional: She appears well-developed and well-nourished. She does not appear ill. No distress.  Nontoxic, no distress  HENT:  Head: Normocephalic and atraumatic.  Bilateral TMs normal, nose normal, no pharyngeal erythema.  No tonsillar swelling, no exudates.  No evidence of PTA or retropharyngeal abscess.  Normal voice.  Tolerating secretions.  Eyes: Conjunctivae and EOM are normal. Pupils are equal, round, and reactive to light.  Neck: Neck supple.  Cardiovascular: Normal rate,  regular rhythm, normal heart sounds and intact distal pulses.  No murmur heard. Pulmonary/Chest: Effort normal and breath sounds normal. No stridor. No respiratory distress. She has no wheezes. She has no rhonchi. She has no rales. She exhibits tenderness (left upper and lateral chest, reproduces pain).  Abdominal: Soft. Bowel sounds are normal. There is no hepatosplenomegaly. There is no tenderness. There is no rigidity, no rebound, no guarding and no CVA tenderness.  Musculoskeletal: She exhibits no edema.  Neurological: She is alert.  Skin: Skin is warm and dry. Capillary refill takes less than 2 seconds.  Psychiatric: She has a normal mood and affect.  Nursing note and vitals reviewed.    ED Treatments / Results  Labs (all labs ordered are listed, but only abnormal results are displayed) Labs Reviewed  LIPASE, BLOOD  COMPREHENSIVE METABOLIC PANEL  CBC  URINALYSIS, ROUTINE W REFLEX MICROSCOPIC  I-STAT BETA HCG BLOOD, ED ([facility], [facility], [facility]  ONLY)  I-STAT TROPONIN, ED  POC URINE PREG, ED    EKG  EKG Interpretation  Date/Time:  Wednesday November 25 2017 15:10:35 EST Ventricular Rate:  51 PR Interval:    QRS Duration: 99 QT Interval:  385 QTC Calculation: 355 R Axis:   71 Text Interpretation:  Sinus rhythm Low voltage, precordial leads ST elev, probable normal early repol pattern No significant change since last tracing Confirmed by Linwood Dibbles [phone]) on 11/25/2017 4:20:25 PM       Radiology Dg Chest 2 View  Result Date: 11/25/2017 CLINICAL DATA:  Nausea and diarrhea for 3 days. Increasing intermittent chest pain. EXAM: CHEST  2 VIEW COMPARISON:  Two-view chest x-ray 07/02/2017 FINDINGS: The heart size and mediastinal contours are within normal limits. Both lungs are clear. The visualized skeletal structures are unremarkable. IMPRESSION: Negative two view chest x-ray Electronically Signed   By: Marin Roberts M.D.   On: 11/25/2017 16:03    Procedures Procedures (including  critical care time)  Medications Ordered in ED Medications - No data to display   Initial Impression / Assessment and Plan / ED Course  I have reviewed the triage vital signs and the nursing notes.  Pertinent labs & imaging results that were available during my care of the patient were reviewed by me and considered in my medical decision making (see chart for details).  After initial exam pt requesting ginger ale.     I discussed the results of the workup with the patient and that all the results have been negative thus far.  I explained that I think she has a viral or flulike illness that will likely resolve on its own.  She states she is not leaving the ER until she receives the strep test and influenza test and that she knows the results of them.  I discussed that I have very low suspicion for strep throat given that she has no tonsillar exudates, no pharyngeal erythema, and no tonsillar swelling, or fevers.  Discussed that she has a cough as well which is not seen in strep throat.  Discussed that it is more likely her symptoms of sore throat are due to either a viral illness, or because she has had postnasal drip for the last few days.  Patient states that she does feel comfortable with this plan to forego a strep and flu test at this time, and I told her that I would give her a prescription for Tamiflu for suspected flulike illness.  She states she is comfortable with the plan, and is agreeable to follow-up with primary care as an outpatient.  Final Clinical Impressions(s) / ED Diagnoses   Final diagnoses:  Flu-like symptoms   Patient presenting to the ED with multiple complaints today.  Has had cough, sore throat, nasal congestion, body aches, vomiting, diarrhea for the last 2 days.  Has not documented any fevers at home however her history seems most consistent with a flu like illness. Pt demanding multiple tests throughout her ED stay. Vitals are stable, no fever. Nontoxic appearing. No  signs of dehydration, tolerating po. Lungs are clear and remainder of physical exam is completely negative except ttp to left lateral chest wall. CXR negative. ECG with no ischemic changes. NSR. Labs completely normal. Trop neg. UA negative for uti. Upreg negative. Doubt ACS given workup. Doubt PE, wells score for dvt&pe 0. PERC negative. Doubt any other cardiopulmonary pathology that would require emergent intervention at this time.     Patient will be discharged  with tamiflu and given instructions to orally hydrate, rest, and use over-the-counter medications such as anti-inflammatories ibuprofen and Aleve for muscle aches and Tylenol for fever.  Patient will also be given flonase for nasal congestion.  ED Discharge Orders        Ordered    acetaminophen (TYLENOL) 325 MG tablet  Every 6 hours PRN     11/25/17 1819    ibuprofen (ADVIL,MOTRIN) 600 MG tablet  Every 6 hours PRN     11/25/17 1819    oseltamivir (TAMIFLU) 75 MG capsule  Every 12 hours     11/25/17 1819    fluticasone (FLONASE) 50 MCG/ACT nasal spray  Daily     11/25/17 1819       Karrie Meres, PA-C 11/25/17 1932    Maia Plan, MD 11/26/17 [phone]

## 2017-11-25 NOTE — Discharge Instructions (Addendum)
You are given a prescription for Tamiflu.  Be aware that this medication may make you have nausea, vomiting, abdominal pain, or diarrhea.  Please make sure to take stay hydrated while you are taking this medication. Rotate tylenol and ibuprofen for fevers and take flonase for your nasal congestion.  You should follow up with your primary healthcare provider within the next 3-5 days for reevaluation.  You will need to return to the emergency department immediately if you experience any of the following symptoms:  Difficulty breathing or shortness or breath Pain or pressure in the chest or abdomen Sudden dizziness Confusion Severe or persistent vomiting Flu-like symptoms that improve but then return with fever or worse cough  You should stay home for at least 24 hours after your fever is gone except to get medical care or other necessities. Your fever should be gone without the need to use a fever-reducing medicine, such as Tylenol or Motrin. Until then, you should stay home from work, school, travel, shopping, social events, and public gatherings.  Stay away from others as much as possible to keep from infecting them. If you must leave home, for example to get medical care, wear a facemask if you have one, or cover coughs and sneezes with a tissue. Wash your hands often to keep from spreading flu to others

## 2017-12-04 HISTORY — PX: FRACTURE SURGERY: SHX138

## 2018-01-01 IMAGING — US US RENAL
1 series · 14 of 25 positions shown · non-contrast
Comparison: None.

CLINICAL DATA: Back pain for 2 days.

EXAM:
RENAL / URINARY TRACT ULTRASOUND COMPLETE

[Series 1: us renal · 0.20mm/px · 14 of 29 slices shown]
[im 1/29]
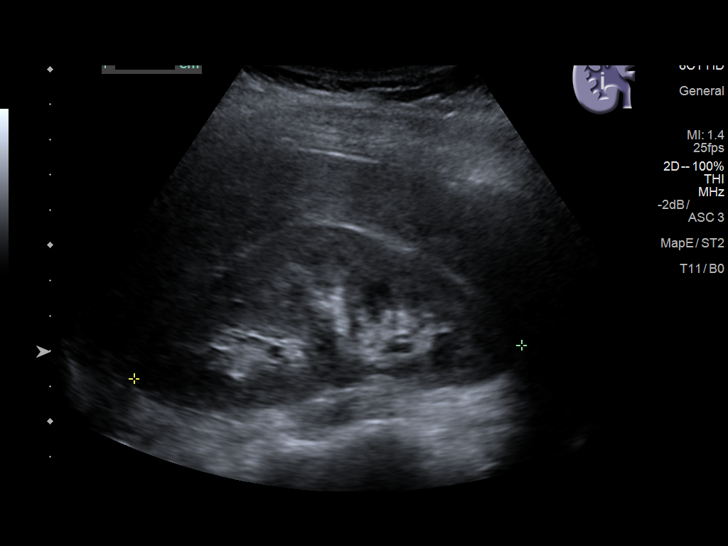
[im 3/29]
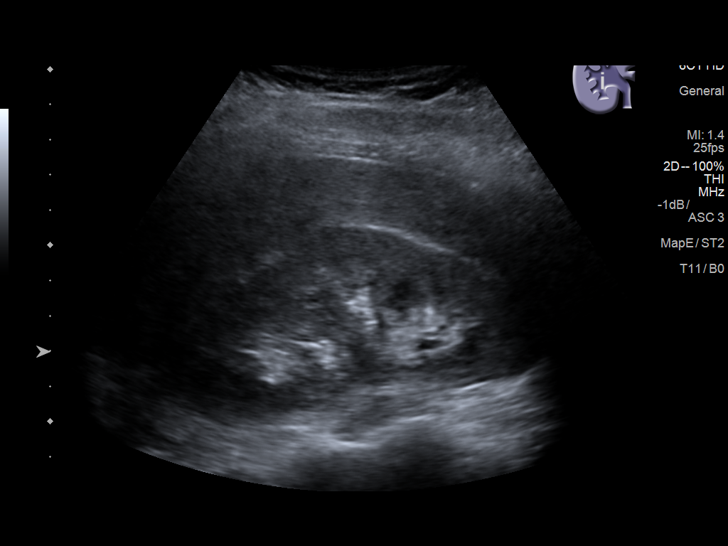
[im 5/29]
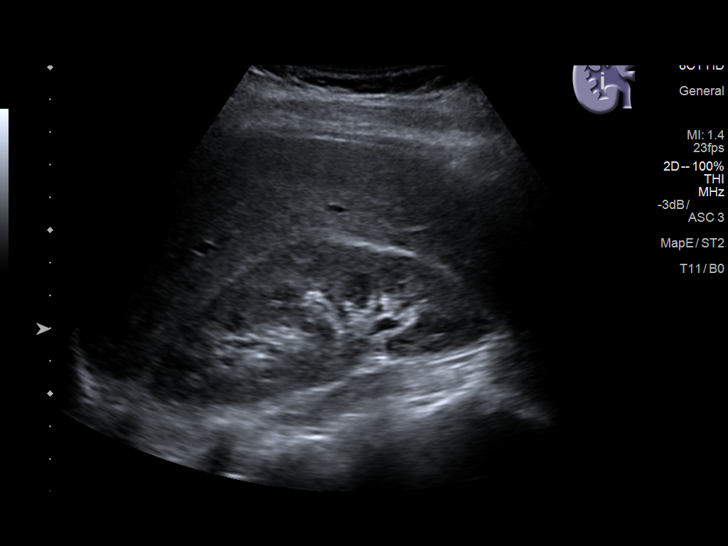
[im 8/29]
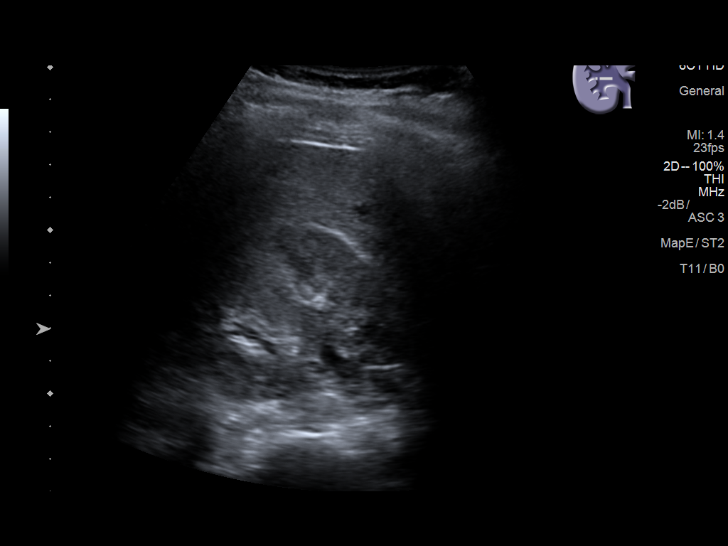
[im 10/29]
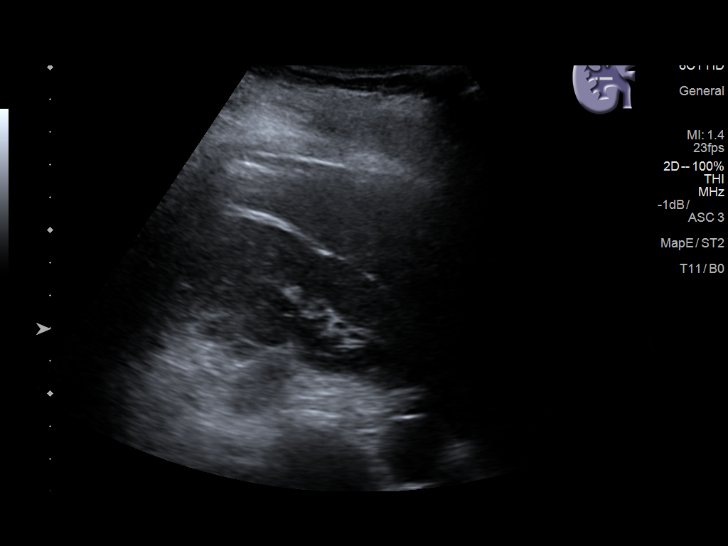
[im 11/29]
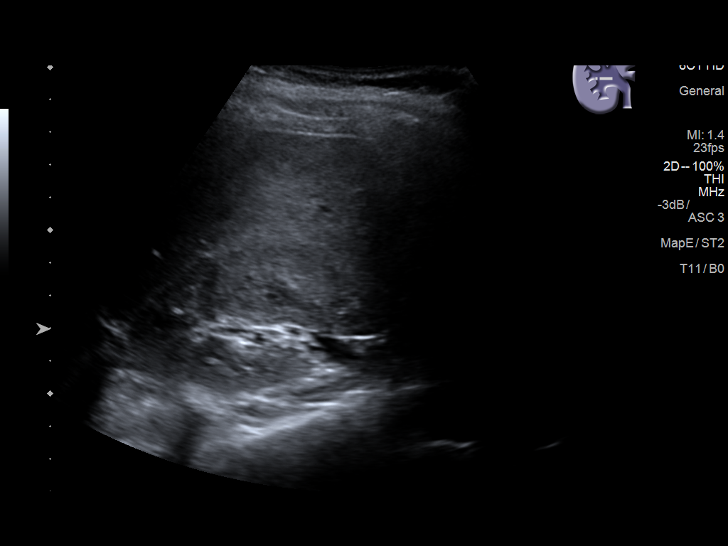
[im 13/29]
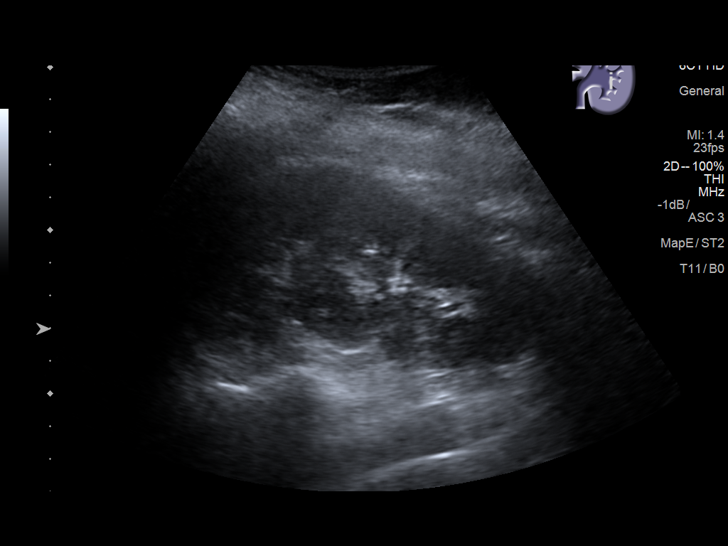
[im 16/29]
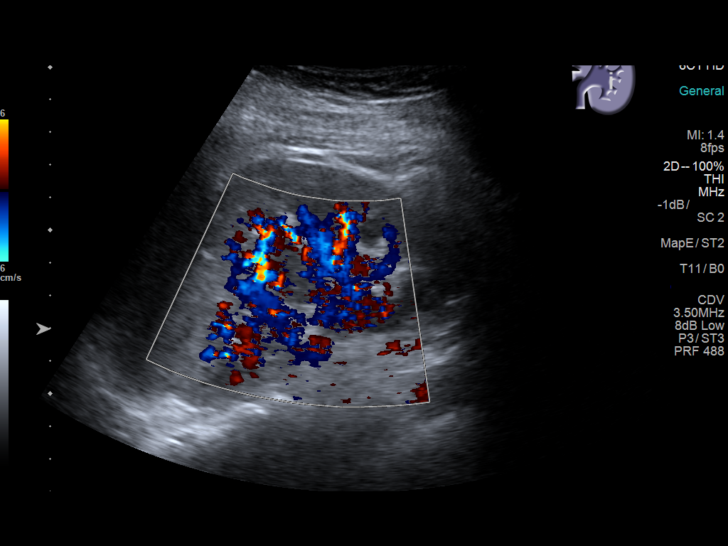
[im 18/29]
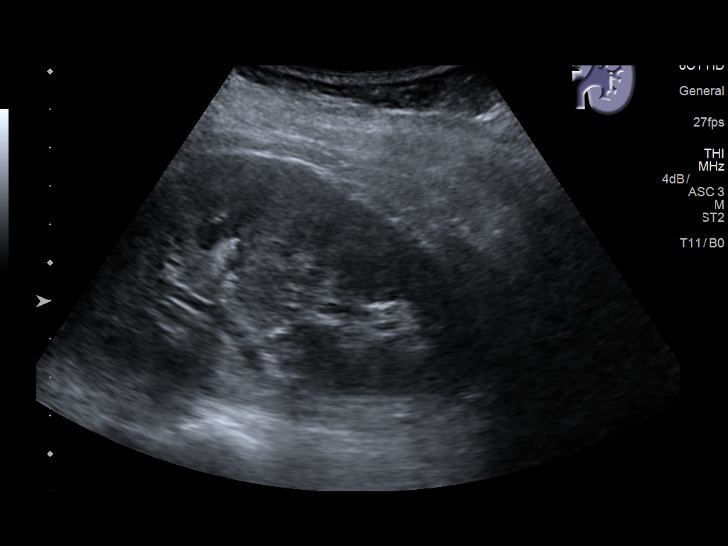
[im 19/29]
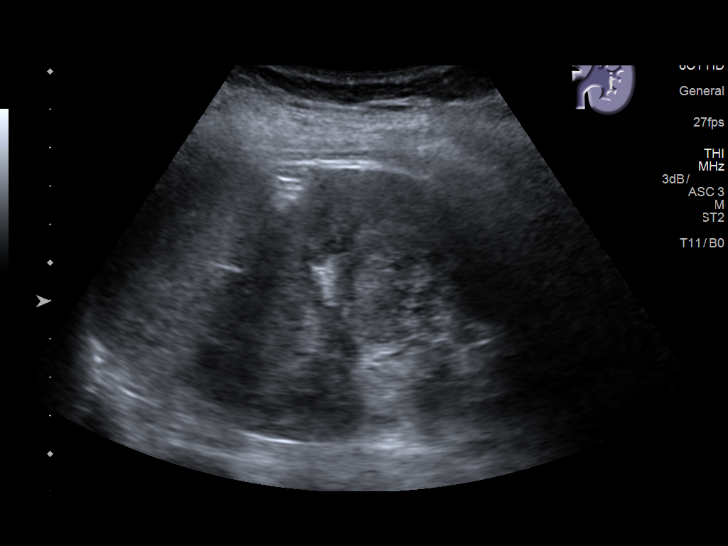
[im 22/29]
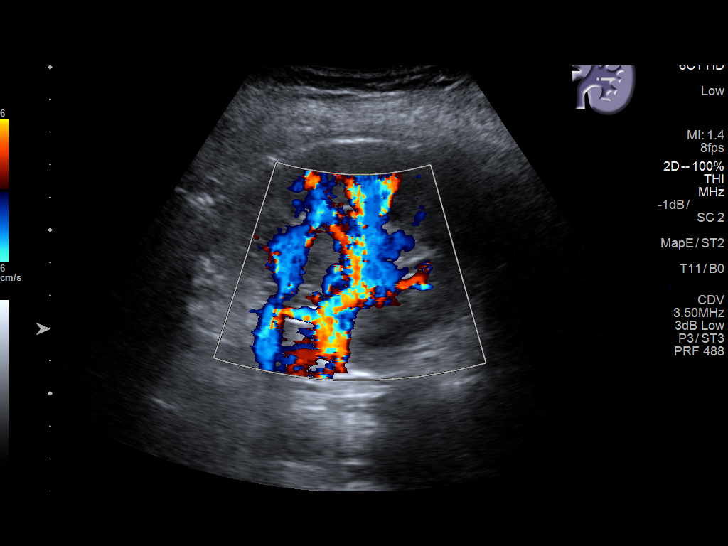
[im 24/29]
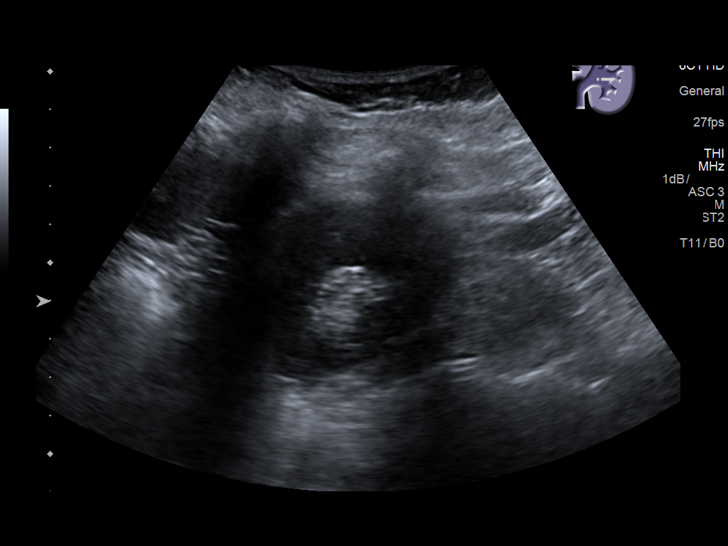
[im 26/29]
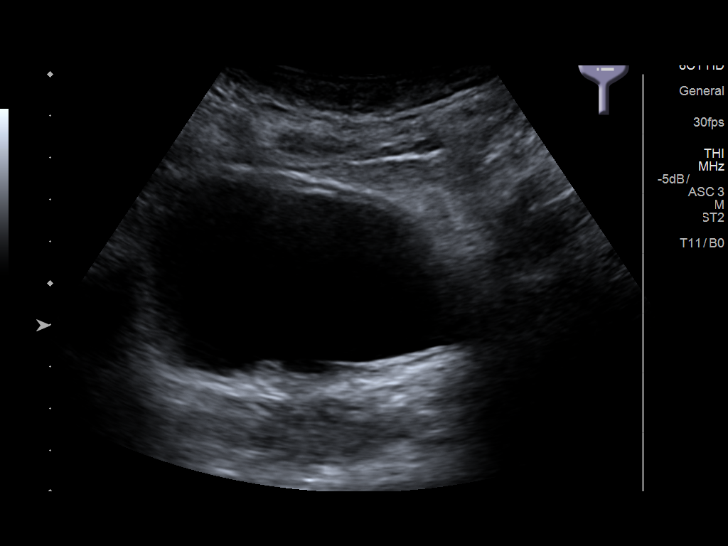
[im 29/29]
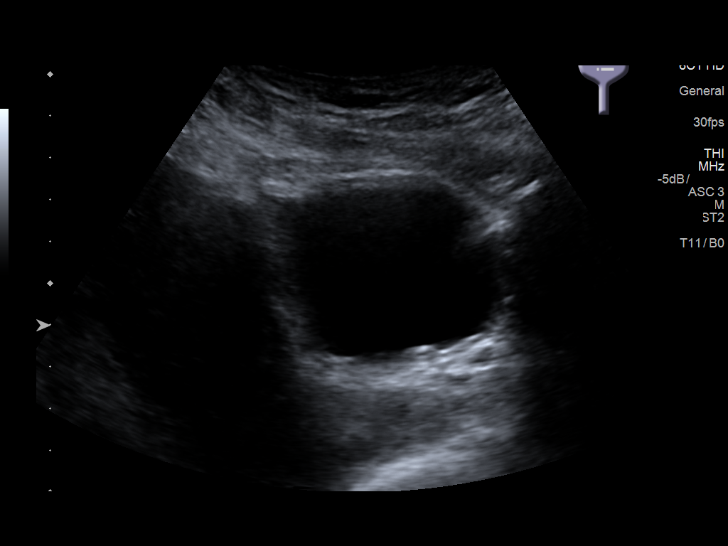

[14 of 25 positions shown; findings below may reference images not displayed]

FINDINGS: Right Kidney:

Length: 11.0 cm. Normal renal cortical thickness and echogenicity
without focal lesions or hydronephrosis. No renal calculi.

Left Kidney:

Length: 11.0 cm. Normal renal cortical thickness and echogenicity
without focal lesions or hydronephrosis. No renal calculi.

Bladder:

Normal.  Bilateral ureteral jets are noted.
IMPRESSION: Normal renal ultrasound examination.

## 2018-01-21 ENCOUNTER — Inpatient Hospital Stay (HOSPITAL_COMMUNITY)
Admission: AD | Admit: 2018-01-21 | Discharge: 2018-01-21 | Disposition: A | Discharge status: Home / Self Care | Payer: Medicaid Other | Source: Ambulatory Visit | Attending: Family Medicine | Admitting: Family Medicine

## 2018-01-21 ENCOUNTER — Other Ambulatory Visit: Payer: Self-pay

## 2018-01-21 ENCOUNTER — Encounter (HOSPITAL_COMMUNITY): Payer: Self-pay | Admitting: *Deleted

## 2018-01-21 DIAGNOSIS — Z79899 Other long term (current) drug therapy: Secondary | ICD-10-CM | POA: Insufficient documentation

## 2018-01-21 DIAGNOSIS — O9989 Other specified diseases and conditions complicating pregnancy, childbirth and the puerperium: Secondary | ICD-10-CM | POA: Diagnosis not present

## 2018-01-21 DIAGNOSIS — N898 Other specified noninflammatory disorders of vagina: Secondary | ICD-10-CM

## 2018-01-21 DIAGNOSIS — F17210 Nicotine dependence, cigarettes, uncomplicated: Secondary | ICD-10-CM | POA: Insufficient documentation

## 2018-01-21 LAB — WET PREP, GENITAL
Clue Cells Wet Prep HPF POC: NONE SEEN
SPERM: NONE SEEN
Trich, Wet Prep: NONE SEEN
Yeast Wet Prep HPF POC: NONE SEEN

## 2018-01-21 LAB — URINALYSIS, ROUTINE W REFLEX MICROSCOPIC
BILIRUBIN URINE: NEGATIVE
Bacteria, UA: NONE SEEN
Glucose, UA: NEGATIVE mg/dL
Ketones, ur: NEGATIVE mg/dL
Leukocytes, UA: NEGATIVE
Nitrite: NEGATIVE
PH: 6 (ref 5.0–8.0)
Protein, ur: NEGATIVE mg/dL
SPECIFIC GRAVITY, URINE: 1.026 (ref 1.005–1.030)

## 2018-01-21 LAB — POCT PREGNANCY, URINE: PREG TEST UR: NEGATIVE

## 2018-01-21 MED ORDER — METRONIDAZOLE 500 MG PO TABS: 500.0000 mg | ORAL_TABLET | Freq: Two times a day (BID) | ORAL | 1 refills | Status: DC

## 2018-01-21 MED ORDER — METRONIDAZOLE 500 MG PO TABS: 500.0000 mg | ORAL_TABLET | Freq: Two times a day (BID) | ORAL | 0 refills | Status: DC

## 2018-01-21 NOTE — Discharge Instructions (Signed)
Some natural remedies/prevention to try for bacterial vaginosis: °Take a probiotic tablet/capsule every day for at least 1-2 months.   °Whenever you have symptoms, use boric acid suppositories vaginally every night for a week.   °Do not use scented soaps/perfumes in the vaginal area and wear breathable cotton underwear and do not wear tight restrictive clothing. ° ° °

## 2018-01-21 NOTE — MAU Note (Signed)
Vaginal discharge for 2 weeks with "fishy/sour" odor.

## 2018-01-21 NOTE — MAU Note (Signed)
Pt c/o white vaginal discharge x 2 weeks thinks it is BV.

## 2018-01-21 NOTE — MAU Provider Note (Signed)
Chief Complaint: Vaginal Discharge   None     SUBJECTIVE HPI: Tonya Yates is a 32 y.o. [id number] who presents to maternity admissions reporting vaginal discharge with odor x 2-3 days. She has had BV before and thinks this may be the cause. She has not tried any treatments. There are no associated symptoms. She denies vaginal bleeding, urinary symptoms, h/a, dizziness, n/v, or fever/chills.     HPI  Past Medical History:  Diagnosis Date  . Acid reflux   . Anemia   . Anxiety   . Depression   . Hypoglycemia   . Infection    UTI   Past Surgical History:  Procedure Laterality Date  . CESAREAN SECTION N/A 09/27/2016   Procedure: CESAREAN SECTION;  Surgeon: Tereso Newcomer, MD;  Location: [facility];  Service: Obstetrics;  Laterality: N/A;  . EYE SURGERY Left    stye removed  . FRACTURE SURGERY Left 12/2017   tibia and fibula   Social History   Socioeconomic History  . Marital status: Single    Spouse name: Not on file  . Number of children: Not on file  . Years of education: Not on file  . Highest education level: Not on file  Occupational History  . Occupation: Engineer, petroleum  Social Needs  . Financial resource strain: Not on file  . Food insecurity:    Worry: Not on file    Inability: Not on file  . Transportation needs:    Medical: Not on file    Non-medical: Not on file  Tobacco Use  . Smoking status: Current Some Day Smoker    Types: Cigarettes  . Smokeless tobacco: Never Used  . Tobacco comment: quit about 6 months pregnant  Substance and Sexual Activity  . Alcohol use: Yes    Comment: rare  . Drug use: No    Comment: Patient reports smoking marijuana in the post  . Sexual activity: Yes  Lifestyle  . Physical activity:    Days per week: Not on file    Minutes per session: Not on file  . Stress: Not on file  Relationships  . Social connections:    Talks on phone: Not on file    Gets together: Not on file    Attends religious service: Not  on file    Active member of club or organization: Not on file    Attends meetings of clubs or organizations: Not on file    Relationship status: Not on file  . Intimate partner violence:    Fear of current or ex partner: Not on file    Emotionally abused: Not on file    Physically abused: Not on file    Forced sexual activity: Not on file  Other Topics Concern  . Not on file  Social History Narrative  . Not on file   No current facility-administered medications on file prior to encounter.    Current Outpatient Medications on File Prior to Encounter  Medication Sig Dispense Refill  . acetaminophen (TYLENOL) 325 MG tablet Take 2 tablets (650 mg total) by mouth every 6 (six) hours as needed. Do not take more than 4000mg  of tylenol per day 30 tablet 0  . albuterol (PROVENTIL HFA;VENTOLIN HFA) 108 (90 Base) MCG/ACT inhaler Inhale 1-2 puffs into the lungs every 6 (six) hours as needed for wheezing or shortness of breath. 1 Inhaler 0  . fluticasone (FLONASE) 50 MCG/ACT nasal spray Place 2 sprays into both nostrils daily. 16 g 0  .  ibuprofen (ADVIL,MOTRIN) 600 MG tablet Take 1 tablet (600 mg total) by mouth every 6 (six) hours as needed. 30 tablet 0   No Known Allergies  ROS:  Review of Systems  Constitutional: Negative for chills, fatigue and fever.  Respiratory: Negative for shortness of breath.   Cardiovascular: Negative for chest pain.  Genitourinary: Positive for vaginal discharge. Negative for difficulty urinating, dysuria, flank pain, pelvic pain, vaginal bleeding and vaginal pain.  Neurological: Negative for dizziness and headaches.  Psychiatric/Behavioral: Negative.      I have reviewed patient's Past Medical Hx, Surgical Hx, Family Hx, Social Hx, medications and allergies.   Physical Exam   Patient Vitals for the past 24 hrs:  BP Temp Pulse Resp Height Weight  01/21/18 1130 106/68 98.9 F (37.2 C) 73 18 5\' 7"  (1.702 m) 169 lb (76.7 kg)   Constitutional: Well-developed,  well-nourished female in no acute distress.  Cardiovascular: normal rate Respiratory: normal effort GI: Abd soft, non-tender. Pos BS x 4 MS: Extremities nontender, no edema, normal ROM Neurologic: Alert and oriented x 4.  GU: Neg CVAT.  PELVIC EXAM: vaginal cultures collected by blind swab  LAB RESULTS Results for orders placed or performed during the hospital encounter of 01/21/18 (from the past 24 hour(s))  Urinalysis, Routine w reflex microscopic     Status: Abnormal   Collection Time: 01/21/18 12:01 PM  Result Value Ref Range   Color, Urine YELLOW YELLOW   APPearance CLEAR CLEAR   Specific Gravity, Urine 1.026 1.005 - 1.030   pH 6.0 5.0 - 8.0   Glucose, UA NEGATIVE NEGATIVE mg/dL   Hgb urine dipstick SMALL (A) NEGATIVE   Bilirubin Urine NEGATIVE NEGATIVE   Ketones, ur NEGATIVE NEGATIVE mg/dL   Protein, ur NEGATIVE NEGATIVE mg/dL   Nitrite NEGATIVE NEGATIVE   Leukocytes, UA NEGATIVE NEGATIVE   RBC / HPF 0-5 0 - 5 RBC/hpf   WBC, UA 0-5 0 - 5 WBC/hpf   Bacteria, UA NONE SEEN NONE SEEN   Squamous Epithelial / LPF 0-5 (A) NONE SEEN   Mucus PRESENT    Hyaline Casts, UA PRESENT   Wet prep, genital     Status: Abnormal   Collection Time: 01/21/18 12:01 PM  Result Value Ref Range   Yeast Wet Prep HPF POC NONE SEEN NONE SEEN   Trich, Wet Prep NONE SEEN NONE SEEN   Clue Cells Wet Prep HPF POC NONE SEEN NONE SEEN   WBC, Wet Prep HPF POC FEW (A) NONE SEEN   Sperm NONE SEEN   Pregnancy, urine POC     Status: None   Collection Time: 01/21/18 12:03 PM  Result Value Ref Range   Preg Test, Ur NEGATIVE NEGATIVE       IMAGING No results found.  MAU Management/MDM: Ordered labs and reviewed results.  Wet prep is negative but pt symptomatic for BV.  Will treat with Flagyl 500 mg BID x 7 days. Prevention of BV discussed and information given.  GCC pending.  F/U in office for routine care, return to MAU for emergencies. Pt discharged with strict return precautions.  ASSESSMENT 1.  Vaginal discharge     PLAN Discharge home Allergies as of 01/21/2018   No Known Allergies     Medication List    STOP taking these medications   oseltamivir 75 MG capsule Commonly known as:  TAMIFLU     TAKE these medications   acetaminophen 325 MG tablet Commonly known as:  TYLENOL Take 2 tablets (650 mg total) by  mouth every 6 (six) hours as needed. Do not take more than 4000mg  of tylenol per day   albuterol 108 (90 Base) MCG/ACT inhaler Commonly known as:  PROVENTIL HFA;VENTOLIN HFA Inhale 1-2 puffs into the lungs every 6 (six) hours as needed for wheezing or shortness of breath.   fluticasone 50 MCG/ACT nasal spray Commonly known as:  FLONASE Place 2 sprays into both nostrils daily.   ibuprofen 600 MG tablet Commonly known as:  ADVIL,MOTRIN Take 1 tablet (600 mg total) by mouth every 6 (six) hours as needed.   metroNIDAZOLE 500 MG tablet Commonly known as:  FLAGYL Take 1 tablet (500 mg total) by mouth 2 (two) times daily.      Follow-up Information    [facility]   Specialty:  Obstetrics and Gynecology Why:  Or gyn provider of your choice for routine gyn care. Return to MAU as needed for emergencies. Contact information: [address] [place] [id number] [phone]          Sharen CounterLisa Leftwich-Kirby Certified Nurse-Midwife 01/21/2018  8:52 PM

## 2018-01-22 LAB — GC/CHLAMYDIA PROBE AMP (~~LOC~~) NOT AT ARMC
Chlamydia: NEGATIVE
Neisseria Gonorrhea: NEGATIVE

## 2018-07-13 ENCOUNTER — Emergency Department (HOSPITAL_COMMUNITY)
Admission: EM | Admit: 2018-07-13 | Discharge: 2018-07-14 | Disposition: A | Discharge status: Home / Self Care | Payer: Self-pay | Attending: Emergency Medicine | Admitting: Emergency Medicine

## 2018-07-13 ENCOUNTER — Emergency Department (HOSPITAL_COMMUNITY): Payer: Self-pay

## 2018-07-13 ENCOUNTER — Encounter (HOSPITAL_COMMUNITY): Payer: Self-pay

## 2018-07-13 DIAGNOSIS — R079 Chest pain, unspecified: Secondary | ICD-10-CM | POA: Insufficient documentation

## 2018-07-13 DIAGNOSIS — G8929 Other chronic pain: Secondary | ICD-10-CM | POA: Insufficient documentation

## 2018-07-13 DIAGNOSIS — F17210 Nicotine dependence, cigarettes, uncomplicated: Secondary | ICD-10-CM | POA: Insufficient documentation

## 2018-07-13 DIAGNOSIS — M79605 Pain in left leg: Secondary | ICD-10-CM | POA: Insufficient documentation

## 2018-07-13 DIAGNOSIS — N390 Urinary tract infection, site not specified: Secondary | ICD-10-CM | POA: Insufficient documentation

## 2018-07-13 DIAGNOSIS — Z79899 Other long term (current) drug therapy: Secondary | ICD-10-CM | POA: Insufficient documentation

## 2018-07-13 LAB — CBC
HEMATOCRIT: 38.4 % (ref 36.0–46.0)
HEMOGLOBIN: 12.4 g/dL (ref 12.0–15.0)
MCH: 29.2 pg (ref 26.0–34.0)
MCHC: 32.3 g/dL (ref 30.0–36.0)
MCV: 90.4 fL (ref 80.0–100.0)
NRBC: 0 % (ref 0.0–0.2)
Platelets: 268 10*3/uL (ref 150–400)
RBC: 4.25 MIL/uL (ref 3.87–5.11)
RDW: 13.4 % (ref 11.5–15.5)
WBC: 11.7 10*3/uL — AB (ref 4.0–10.5)

## 2018-07-13 LAB — POCT I-STAT TROPONIN I: TROPONIN I, POC: 0 ng/mL (ref 0.00–0.08)

## 2018-07-13 LAB — BASIC METABOLIC PANEL
ANION GAP: 9 (ref 5–15)
BUN: 14 mg/dL (ref 6–20)
CHLORIDE: 108 mmol/L (ref 98–111)
CO2: 26 mmol/L (ref 22–32)
CREATININE: 0.74 mg/dL (ref 0.44–1.00)
Calcium: 10.2 mg/dL (ref 8.9–10.3)
GFR calc non Af Amer: >60 mL/min (ref >60)
Glucose, Bld: 86 mg/dL (ref 70–99)
POTASSIUM: 3.6 mmol/L (ref 3.5–5.1)
SODIUM: 143 mmol/L (ref 135–145)

## 2018-07-13 LAB — I-STAT BETA HCG BLOOD, ED (NOT ORDERABLE): I-stat hCG, quantitative: <5 m[IU]/mL (ref <5)

## 2018-07-13 NOTE — ED Triage Notes (Signed)
Pt has multiple complaints tonight... She complains of burning when urinating and her urine having a foul odor for three days Pt also states that her chest hurts under her left breast and she describes it as a stabbing pain Pt also broke her leg in March and she's been having pains and foot numbness on that side

## 2018-07-14 LAB — URINALYSIS, ROUTINE W REFLEX MICROSCOPIC
Bilirubin Urine: NEGATIVE
Glucose, UA: NEGATIVE mg/dL
Ketones, ur: NEGATIVE mg/dL
Nitrite: POSITIVE — AB
Protein, ur: NEGATIVE mg/dL
Specific Gravity, Urine: 1.028 (ref 1.005–1.030)
pH: 6 (ref 5.0–8.0)

## 2018-07-14 MED ORDER — CEPHALEXIN 500 MG PO CAPS
1000.0000 mg | ORAL_CAPSULE | Freq: Once | ORAL | Status: AC
  Administered 2018-07-14: 1000 mg via ORAL
  Filled 2018-07-14: qty 2

## 2018-07-14 MED ORDER — CEPHALEXIN 500 MG PO CAPS: 500.0000 mg | ORAL_CAPSULE | Freq: Two times a day (BID) | ORAL | 0 refills | Status: AC

## 2018-07-14 NOTE — ED Provider Notes (Signed)
[facility] Provider Note: Tonya Dell, MD, FACEP  CSN: [id number] MRN: [id number] ARRIVAL: 07/13/18 at 2209 ROOM: WA18/WA18   CHIEF COMPLAINT  Dysuria   HISTORY OF PRESENT ILLNESS  07/14/18 12:27 AM Tonya Yates is a 32 y.o. female with a 4-day history of foul-smelling urine with moderate burning with urination.  She denies associated fever, chills, nausea, vomiting or diarrhea.  She is having some back pain with this.  She is also complaining of pain and focal numbness in her left lower leg since having orthopedic surgery in March of this year.  She has taken ibuprofen for this with improvement.  She also complains of at least 2 years of chest pain.  The chest pain is located in the area of her left breast and she describes it as a pinching or sharp sensation.  It is intermittent and sometimes worse at night.  Nothing seems to make this pain better or worse and it is not associated with shortness of breath.   Past Medical History:  Diagnosis Date  . Acid reflux   . Anemia   . Anxiety   . Depression   . Hypoglycemia   . Infection    UTI    Past Surgical History:  Procedure Laterality Date  . CESAREAN SECTION N/A 09/27/2016   Procedure: CESAREAN SECTION;  Surgeon: Tereso Newcomer, MD;  Location: [facility];  Service: Obstetrics;  Laterality: N/A;  . EYE SURGERY Left    stye removed  . FRACTURE SURGERY Left 12/2017   tibia and fibula    Family History  Problem Relation Age of Onset  . Diabetes Maternal Grandmother   . Congestive Heart Failure Maternal Grandmother   . Hearing loss Neg Hx     Social History   Tobacco Use  . Smoking status: Current Some Day Smoker    Types: Cigarettes  . Smokeless tobacco: Never Used  . Tobacco comment: quit about 6 months pregnant  Substance Use Topics  . Alcohol use: Yes    Comment: rare  . Drug use: No    Comment: Patient reports smoking marijuana in the post    Prior to Admission medications     Medication Sig Start Date End Date Taking? Authorizing Provider  acetaminophen (TYLENOL) 325 MG tablet Take 2 tablets (650 mg total) by mouth every 6 (six) hours as needed. Do not take more than 4000mg  of tylenol per day Patient not taking: Reported on 07/14/2018 11/25/17   Couture, Cortni S, PA-C  albuterol (PROVENTIL HFA;VENTOLIN HFA) 108 (90 Base) MCG/ACT inhaler Inhale 1-2 puffs into the lungs every 6 (six) hours as needed for wheezing or shortness of breath. Patient not taking: Reported on 07/14/2018 10/04/16   Randel Pigg, Dorma Russell, MD  fluticasone [facility]) 50 MCG/ACT nasal spray Place 2 sprays into both nostrils daily. Patient not taking: Reported on 07/14/2018 11/25/17   Couture, Cortni S, PA-C  ibuprofen (ADVIL,MOTRIN) 600 MG tablet Take 1 tablet (600 mg total) by mouth every 6 (six) hours as needed. Patient not taking: Reported on 07/14/2018 11/25/17   Couture, Cortni S, PA-C  metroNIDAZOLE (FLAGYL) 500 MG tablet Take 1 tablet (500 mg total) by mouth 2 (two) times daily. Patient not taking: Reported on 07/14/2018 01/21/18   Hurshel Party, CNM    Allergies Patient has no known allergies.   REVIEW OF SYSTEMS  Negative except as noted here or in the History of Present Illness.   PHYSICAL EXAMINATION  Initial Vital Signs Blood pressure 124/76, pulse 60,  temperature 98.9 F (37.2 C), temperature source Oral, resp. rate 16, last menstrual period 06/20/2018, SpO2 97 %, unknown if currently breastfeeding.  Examination General: Well-developed, well-nourished female in no acute distress; appearance consistent with age of record HENT: normocephalic; atraumatic Eyes: pupils equal, round and reactive to light; extraocular muscles intact Neck: supple Heart: regular rate and rhythm Lungs: clear to auscultation bilaterally Chest: Nontender Abdomen: soft; nondistended; nontender; bowel sounds present Extremities: No deformity; full range of motion; pulses normal; well-healed surgical  incisions to left lower leg without swelling or signs of infection Neurologic: Awake, alert and oriented; motor function intact in all extremities and symmetric; no facial droop; decreased sensation on lateral and medial aspects of anterior left lower leg Skin: Warm and dry Psychiatric: Flat affect   RESULTS  Summary of this visit's results, reviewed by myself:   EKG Interpretation  Date/Time:  Tuesday July 13 2018 22:38:00 EDT Ventricular Rate:  57 PR Interval:    QRS Duration: 76 QT Interval:  372 QTC Calculation: 363 R Axis:   79 Text Interpretation:  Sinus rhythm Baseline wander in lead(s) III No significant change was found Confirmed by Trevelle Mcgurn, Jonny Ruiz ([id number]) on 07/14/2018 12:40:18 AM      Laboratory Studies: Results for orders placed or performed during the hospital encounter of 07/13/18 (from the past 24 hour(s))  Basic metabolic panel     Status: None   Collection Time: 07/13/18 10:49 PM  Result Value Ref Range   Sodium 143 135 - 145 mmol/L   Potassium 3.6 3.5 - 5.1 mmol/L   Chloride 108 98 - 111 mmol/L   CO2 26 22 - 32 mmol/L   Glucose, Bld 86 70 - 99 mg/dL   BUN 14 6 - 20 mg/dL   Creatinine, Ser 6.04 0.44 - 1.00 mg/dL   Calcium 54.0 8.9 - 98.1 mg/dL   GFR calc non Af Amer >60 >60 mL/min   GFR calc Af Amer >60 >60 mL/min   Anion gap 9 5 - 15  CBC     Status: Abnormal   Collection Time: 07/13/18 10:49 PM  Result Value Ref Range   WBC 11.7 (H) 4.0 - 10.5 K/uL   RBC 4.25 3.87 - 5.11 MIL/uL   Hemoglobin 12.4 12.0 - 15.0 g/dL   HCT 19.1 47.8 - 29.5 %   MCV 90.4 80.0 - 100.0 fL   MCH 29.2 26.0 - 34.0 pg   MCHC 32.3 30.0 - 36.0 g/dL   RDW 62.1 30.8 - 65.7 %   Platelets 268 150 - 400 K/uL   nRBC 0.0 0.0 - 0.2 %  I-Stat beta hCG blood, ED     Status: None   Collection Time: 07/13/18 10:56 PM  Result Value Ref Range   I-stat hCG, quantitative <5.0 <5 mIU/mL   Comment 3          POCT i-Stat troponin I     Status: None   Collection Time: 07/13/18 10:58 PM    Result Value Ref Range   Troponin i, poc 0.00 0.00 - 0.08 ng/mL   Comment 3          Urinalysis, Routine w reflex microscopic     Status: Abnormal   Collection Time: 07/14/18 12:32 AM  Result Value Ref Range   Color, Urine YELLOW YELLOW   APPearance HAZY (A) CLEAR   Specific Gravity, Urine 1.028 1.005 - 1.030   pH 6.0 5.0 - 8.0   Glucose, UA NEGATIVE NEGATIVE mg/dL   Hgb urine dipstick SMALL (  A) NEGATIVE   Bilirubin Urine NEGATIVE NEGATIVE   Ketones, ur NEGATIVE NEGATIVE mg/dL   Protein, ur NEGATIVE NEGATIVE mg/dL   Nitrite POSITIVE (A) NEGATIVE   Leukocytes, UA TRACE (A) NEGATIVE   RBC / HPF 6-10 0 - 5 RBC/hpf   WBC, UA 6-10 0 - 5 WBC/hpf   Bacteria, UA MANY (A) NONE SEEN   Squamous Epithelial / LPF 6-10 0 - 5   Mucus PRESENT    Hyaline Casts, UA PRESENT    Amorphous Crystal PRESENT    Imaging Studies: Dg Chest 2 View  Result Date: 07/13/2018 CLINICAL DATA:  Sharp chest pain under the left breast on and off for 2 years. Patient was diagnosed with a large heart during pregnancy 2 years ago. Occasional smoker. EXAM: CHEST - 2 VIEW COMPARISON:  11/25/2017 FINDINGS: The heart size and mediastinal contours are within normal limits. Both lungs are clear. The visualized skeletal structures are unremarkable. IMPRESSION: No active cardiopulmonary disease. Electronically Signed   By: Burman Nieves M.D.   On: 07/13/2018 22:59    ED COURSE and MDM  Nursing notes and initial vitals signs, including pulse oximetry, reviewed.  Vitals:   07/13/18 2225 07/14/18 0053  BP: 124/76 103/66  Pulse: 60 62  Resp: 16 16  Temp: 98.9 F (37.2 C)   TempSrc: Oral   SpO2: 97% 100%   1:37 AM Patient advised of diagnostic studies which are largely reassuring but suggestive of urinary tract infection.  Patient tearful and complaining that she feels stressed out.  When asked what is stressing her out she states "everything".  She denies SI or HI however.  She was advised she will need to establish  with a PCP regarding her stress and her chronic chest and leg pain.   PROCEDURES    ED DIAGNOSES     ICD-10-CM   1. Lower urinary tract infectious disease N39.0   2. Chronic chest pain R07.9    G89.29   3. 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

## 2018-07-16 LAB — URINE CULTURE

## 2018-07-17 ENCOUNTER — Telehealth: Payer: Self-pay

## 2018-07-17 NOTE — Telephone Encounter (Signed)
Post ED Visit - Positive Culture Follow-up  Culture report reviewed by antimicrobial stewardship pharmacist:  []  Enzo Bi, Pharm.D. []  Celedonio Miyamoto, Pharm.D., BCPS AQ-ID []  Garvin Fila, Pharm.D., BCPS []  Georgina Pillion, Pharm.D., BCPS []  [place], [address].D., BCPS, AAHIVP []  Estella Husk, Pharm.D., BCPS, AAHIVP []  Lysle Pearl, PharmD, BCPS []  Phillips Climes, PharmD, BCPS []  Agapito Games, PharmD, BCPS [x]  Verlan Friends, PharmD  Positive urine culture Treated with Cephalexin, organism sensitive to the same and no further patient follow-up is required at this time.  Jerry Caras 07/17/2018, 10:54 AM
